# Patient Record
Sex: Male | Born: 1945 | Race: White | Hispanic: No | Marital: Married | Smoking: Never smoker
Health system: Southern US, Community
[De-identification: ages and names within clinical notes are randomized; demographics above are authoritative.]

## PROBLEM LIST (undated history)

## (undated) DIAGNOSIS — M4647 Discitis, unspecified, lumbosacral region: Secondary | ICD-10-CM

## (undated) DIAGNOSIS — N183 Chronic kidney disease, stage 3 unspecified: Secondary | ICD-10-CM

## (undated) DIAGNOSIS — I2699 Other pulmonary embolism without acute cor pulmonale: Secondary | ICD-10-CM

## (undated) DIAGNOSIS — B958 Unspecified staphylococcus as the cause of diseases classified elsewhere: Secondary | ICD-10-CM

## (undated) DIAGNOSIS — M109 Gout, unspecified: Secondary | ICD-10-CM

## (undated) DIAGNOSIS — I10 Essential (primary) hypertension: Secondary | ICD-10-CM

## (undated) DIAGNOSIS — I4891 Unspecified atrial fibrillation: Secondary | ICD-10-CM

## (undated) DIAGNOSIS — E785 Hyperlipidemia, unspecified: Secondary | ICD-10-CM

## (undated) DIAGNOSIS — E119 Type 2 diabetes mellitus without complications: Secondary | ICD-10-CM

## (undated) DIAGNOSIS — K219 Gastro-esophageal reflux disease without esophagitis: Secondary | ICD-10-CM

## (undated) HISTORY — DX: Essential (primary) hypertension: I10

## (undated) HISTORY — PX: SHOULDER ARTHROSCOPY W/ ROTATOR CUFF REPAIR: SHX2400

## (undated) HISTORY — DX: Gastro-esophageal reflux disease without esophagitis: K21.9

## (undated) HISTORY — DX: Type 2 diabetes mellitus without complications: E11.9

## (undated) HISTORY — DX: Hyperlipidemia, unspecified: E78.5

---

## 2004-05-15 ENCOUNTER — Inpatient Hospital Stay: Payer: Self-pay

## 2004-05-16 ENCOUNTER — Other Ambulatory Visit: Payer: Self-pay

## 2005-01-03 ENCOUNTER — Ambulatory Visit: Payer: Self-pay

## 2005-01-09 ENCOUNTER — Ambulatory Visit: Payer: Self-pay

## 2005-10-01 ENCOUNTER — Ambulatory Visit: Payer: Self-pay | Admitting: Family Medicine

## 2005-10-16 ENCOUNTER — Ambulatory Visit: Payer: Self-pay | Admitting: Gastroenterology

## 2006-06-24 ENCOUNTER — Ambulatory Visit: Payer: Self-pay | Admitting: Internal Medicine

## 2006-07-04 ENCOUNTER — Ambulatory Visit: Payer: Self-pay | Admitting: Internal Medicine

## 2007-05-08 ENCOUNTER — Ambulatory Visit: Payer: Self-pay | Admitting: Family Medicine

## 2007-06-16 ENCOUNTER — Ambulatory Visit: Payer: Self-pay | Admitting: Specialist

## 2007-07-27 ENCOUNTER — Ambulatory Visit: Payer: Self-pay | Admitting: Family Medicine

## 2008-06-12 ENCOUNTER — Emergency Department: Payer: Self-pay | Admitting: Emergency Medicine

## 2008-10-19 ENCOUNTER — Ambulatory Visit: Payer: Self-pay | Admitting: Specialist

## 2008-12-07 ENCOUNTER — Emergency Department: Payer: Self-pay | Admitting: Emergency Medicine

## 2008-12-09 ENCOUNTER — Inpatient Hospital Stay: Payer: Self-pay | Admitting: Internal Medicine

## 2009-01-27 ENCOUNTER — Emergency Department: Payer: Self-pay | Admitting: Emergency Medicine

## 2010-05-31 DIAGNOSIS — M109 Gout, unspecified: Secondary | ICD-10-CM | POA: Insufficient documentation

## 2010-05-31 DIAGNOSIS — I129 Hypertensive chronic kidney disease with stage 1 through stage 4 chronic kidney disease, or unspecified chronic kidney disease: Secondary | ICD-10-CM | POA: Insufficient documentation

## 2010-05-31 DIAGNOSIS — E785 Hyperlipidemia, unspecified: Secondary | ICD-10-CM | POA: Insufficient documentation

## 2010-05-31 DIAGNOSIS — E119 Type 2 diabetes mellitus without complications: Secondary | ICD-10-CM | POA: Insufficient documentation

## 2011-12-18 DIAGNOSIS — E875 Hyperkalemia: Secondary | ICD-10-CM | POA: Insufficient documentation

## 2015-01-10 ENCOUNTER — Ambulatory Visit (INDEPENDENT_AMBULATORY_CARE_PROVIDER_SITE_OTHER): Payer: Federal, State, Local not specified - PPO | Admitting: Family Medicine

## 2015-01-10 ENCOUNTER — Encounter: Payer: Self-pay | Admitting: Family Medicine

## 2015-01-10 VITALS — BP 122/76 | HR 65 | Temp 98.9°F | Resp 18 | Ht 73.0 in | Wt 238.3 lb

## 2015-01-10 DIAGNOSIS — E0840 Diabetes mellitus due to underlying condition with diabetic neuropathy, unspecified: Secondary | ICD-10-CM

## 2015-01-10 DIAGNOSIS — J40 Bronchitis, not specified as acute or chronic: Secondary | ICD-10-CM | POA: Diagnosis not present

## 2015-01-10 DIAGNOSIS — J0101 Acute recurrent maxillary sinusitis: Secondary | ICD-10-CM

## 2015-01-10 MED ORDER — PREDNISONE 20 MG PO TABS
20.0000 mg | ORAL_TABLET | Freq: Every day | ORAL | Status: DC
Start: 2015-01-10 — End: 2015-01-25

## 2015-01-10 MED ORDER — FLUTICASONE PROPIONATE 50 MCG/ACT NA SUSP: 2.0000 | Freq: Every day | NASAL | Status: DC

## 2015-01-10 MED ORDER — BENZONATATE 100 MG PO CAPS: 100.0000 mg | ORAL_CAPSULE | Freq: Two times a day (BID) | ORAL | Status: DC | PRN

## 2015-01-10 MED ORDER — AMOXICILLIN-POT CLAVULANATE 875-125 MG PO TABS: 1.0000 | ORAL_TABLET | Freq: Two times a day (BID) | ORAL | Status: DC

## 2015-01-10 NOTE — Progress Notes (Signed)
Name: Eric Glover   MRN: [id number]    DOB: 11-Jul-1945   Date:01/10/2015       Progress Note  Subjective  Chief Complaint  Chief Complaint  Patient presents with  . URI    for 3 weeks    HPI  Sinusitis  Patient presents with greater than 7 day history of nasal congestion and drainage which is purulent in color. There is tenderness over the sinuses. There has been fever to none along with some associated chills on occasion. Usage of over-the-counter medications is not been affected. There is also accompanying cough productive of purulent sputum.  Bronchitis  Patient presents with a greater than 1 week history of cough productive of purulent sputum. The cough is irritating and keep the patient awake at night. There has no fever or chills.  Over-the-counter meds And completely effective.  Diabetes  Patient states sugars have been running well and admitted 100s being followed by the [facility]    Past Medical History  Diagnosis Date  . Diabetes mellitus without complication ([place])   . GERD (gastroesophageal reflux disease)   . Hyperlipidemia   . Hypertension     Social History  Substance Use Topics  . Smoking status: Never Smoker   . Smokeless tobacco: Not on file  . Alcohol Use: No     Current outpatient prescriptions:  .  colchicine (COLCRYS) 0.6 MG tablet, Take 0.6 mg by mouth., Disp: , Rfl:  .  furosemide (LASIX) 20 MG tablet, Take 20 mg by mouth., Disp: , Rfl:  .  insulin aspart protamine- aspart (NOVOLOG MIX 70/30) (70-30) 100 UNIT/ML injection, Frequency:PHARMDIR   Dosage:0.0     Instructions:  Note:65U in the am 35U in the evening Dose: 70-30/ML, Disp: , Rfl:  .  propranolol ER (INDERAL LA) 80 MG 24 hr capsule, Take 80 mg by mouth., Disp: , Rfl:  .  tiZANidine (ZANAFLEX) 4 MG tablet, Take 4 mg by mouth., Disp: , Rfl:  .  allopurinol (ZYLOPRIM) 100 MG tablet, Take 100 mg by mouth., Disp: , Rfl:  .  aspirin EC 81 MG tablet, Take 81 mg by mouth., Disp: , Rfl:  .   doxazosin (CARDURA) 8 MG tablet, Take 8 mg by mouth., Disp: , Rfl:  .  HYDROcodone-acetaminophen (NORCO/VICODIN) 5-325 MG tablet, Take by mouth., Disp: , Rfl:  .  insulin detemir (LEVEMIR) 100 UNIT/ML injection, Inject into the skin., Disp: , Rfl:  .  lansoprazole (PREVACID) 15 MG capsule, Take 15 mg by mouth., Disp: , Rfl:  .  lisinopril (PRINIVIL,ZESTRIL) 40 MG tablet, Take 40 mg by mouth., Disp: , Rfl:  .  simvastatin (ZOCOR) 80 MG tablet, Take 80 mg by mouth., Disp: , Rfl:  .  vitamin B-12 (CYANOCOBALAMIN) 500 MCG tablet, Take by mouth., Disp: , Rfl:   Allergies  Allergen Reactions  . Neomycin-Bacitracin Zn-Polymyx     RASH    Review of Systems  Constitutional: Positive for chills. Negative for fever and weight loss.  HENT: Positive for congestion. Negative for hearing loss, sore throat and tinnitus.        Sinus tenderness  Eyes: Negative for blurred vision, double vision and redness.  Respiratory: Positive for cough, sputum production and wheezing. Negative for hemoptysis and shortness of breath.   Cardiovascular: Negative for chest pain, palpitations, orthopnea, claudication and leg swelling.  Gastrointestinal: Negative for heartburn, nausea, vomiting, diarrhea, constipation and blood in stool.  Genitourinary: Negative for dysuria, urgency, frequency and hematuria.  Musculoskeletal: Negative for myalgias, back pain,  joint pain, falls and neck pain.  Skin: Negative for itching.  Neurological: Negative for dizziness, tingling, tremors, focal weakness, seizures, loss of consciousness, weakness and headaches.  Endo/Heme/Allergies: Does not bruise/bleed easily.  Psychiatric/Behavioral: Negative for depression and substance abuse. The patient is not nervous/anxious and does not have insomnia.      Objective  Filed Vitals:   01/10/15 1349  BP: 122/76  Pulse: 65  Temp: 98.9 F (37.2 C)  TempSrc: Oral  Resp: 18  Height: 6\' 1"  (1.854 m)  Weight: 238 lb 4.8 oz (108.092 kg)   SpO2: 96%     Physical Exam  Constitutional: He is oriented to person, place, and time.  Obese but was frequently coughing  HENT:  Head: Normocephalic.  Right TM is erythematous left. There is bilateral nasal turbinate swelling with clear to purulent discharge. Pharynx minimally injected  Eyes: EOM are normal. Pupils are equal, round, and reactive to light.  Neck: Normal range of motion. Neck supple. No thyromegaly present.  Cardiovascular: Normal rate, regular rhythm and normal heart sounds.   No murmur heard. Pulmonary/Chest: Breath sounds normal. No respiratory distress. He has no wheezes.  Scattered rhonchi are noted  Musculoskeletal: Normal range of motion. He exhibits no edema.  Lymphadenopathy:    He has no cervical adenopathy.  Neurological: He is alert and oriented to person, place, and time. No cranial nerve deficit. Gait normal. Coordination normal.  Skin: Skin is warm and dry. No rash noted.  Psychiatric: Affect and judgment normal.      Assessment & Plan   1. Acute recurrent maxillary sinusitis Treatment as below - amoxicillin-clavulanate (AUGMENTIN) 875-125 MG tablet; Take 1 tablet by mouth 2 (two) times daily.  Dispense: 20 tablet; Refill: 0 - predniSONE (DELTASONE) 20 MG tablet; Take 1 tablet (20 mg total) by mouth daily with breakfast.  Dispense: 10 tablet; Refill: 0 - fluticasone (FLONASE) 50 MCG/ACT nasal spray; Place 2 sprays into both nostrils daily.  Dispense: 16 g; Refill: 6 - benzonatate (TESSALON) 100 MG capsule; Take 1 capsule (100 mg total) by mouth 2 (two) times daily as needed for cough.  Dispense: 20 capsule; Refill: 0  2. Bronchitis Treated as below - amoxicillin-clavulanate (AUGMENTIN) 875-125 MG tablet; Take 1 tablet by mouth 2 (two) times daily.  Dispense: 20 tablet; Refill: 0 - predniSONE (DELTASONE) 20 MG tablet; Take 1 tablet (20 mg total) by mouth daily with breakfast.  Dispense: 10 tablet; Refill: 0 - fluticasone (FLONASE) 50 MCG/ACT  nasal spray; Place 2 sprays into both nostrils daily.  Dispense: 16 g; Refill: 6 - benzonatate (TESSALON) 100 MG capsule; Take 1 capsule (100 mg total) by mouth 2 (two) times daily as needed for cough.  Dispense: 20 capsule; Refill: 0    3. Diabetes mellitus with neuropathy

## 2015-01-11 DIAGNOSIS — E1140 Type 2 diabetes mellitus with diabetic neuropathy, unspecified: Secondary | ICD-10-CM | POA: Insufficient documentation

## 2015-01-25 ENCOUNTER — Encounter: Payer: Self-pay | Admitting: Family Medicine

## 2015-01-25 ENCOUNTER — Ambulatory Visit (INDEPENDENT_AMBULATORY_CARE_PROVIDER_SITE_OTHER): Payer: Federal, State, Local not specified - PPO | Admitting: Family Medicine

## 2015-01-25 VITALS — BP 110/54 | HR 74 | Temp 98.8°F | Resp 16 | Ht 73.0 in | Wt 238.0 lb

## 2015-01-25 DIAGNOSIS — J209 Acute bronchitis, unspecified: Secondary | ICD-10-CM

## 2015-01-25 DIAGNOSIS — J40 Bronchitis, not specified as acute or chronic: Secondary | ICD-10-CM | POA: Diagnosis not present

## 2015-01-25 MED ORDER — TRAMADOL HCL 50 MG PO TABS: 50.0000 mg | ORAL_TABLET | Freq: Four times a day (QID) | ORAL | Status: DC | PRN

## 2015-01-25 MED ORDER — PREDNISONE 20 MG PO TABS: ORAL_TABLET | ORAL | Status: DC

## 2015-01-25 MED ORDER — CEFTRIAXONE SODIUM 1 G IJ SOLR
1.0000 g | Freq: Once | INTRAMUSCULAR | Status: AC
  Administered 2015-01-25: 1 g via INTRAMUSCULAR

## 2015-01-25 MED ORDER — HYDROCOD POLST-CPM POLST ER 10-8 MG/5ML PO SUER: 5.0000 mL | Freq: Two times a day (BID) | ORAL | Status: DC | PRN

## 2015-01-25 NOTE — Patient Instructions (Signed)
Chronic Bronchitis Chronic bronchitis is a lasting inflammation of the bronchial tubes, which are the tubes that carry air into your lungs. This is inflammation that occurs:   On most days of the week.   For at least three months at a time.   Over a period of two years in a row. When the bronchial tubes are inflamed, they start to produce mucus. The inflammation and buildup of mucus make it more difficult to breathe. Chronic bronchitis is usually a permanent problem and is one type of chronic obstructive pulmonary disease (COPD). People with chronic bronchitis are at greater risk for getting repeated colds, or respiratory infections. CAUSES  Chronic bronchitis most often occurs in people who have:  Long-standing, severe asthma.  A history of smoking.  Asthma and who also smoke. SIGNS AND SYMPTOMS  Chronic bronchitis may cause the following:   A cough that brings up mucus (productive cough).  Shortness of breath.  Early morning headache.  Wheezing.  Chest discomfort.   Recurring respiratory infections. DIAGNOSIS  Your health care provider may confirm the diagnosis by:  Taking your medical history.  Performing a physical exam.  Taking a chest X-ray.   Performing pulmonary function tests. TREATMENT  Treatment involves controlling symptoms with medicines, oxygen therapy, or making lifestyle changes, such as exercising and eating a healthy, well-balanced diet. Medicines could include:  Inhalers to improve air flow in and out of your lungs.  Antibiotics to treat bacterial infections, such as pneumonia, sinus infections, and acute bronchitis. As a preventative measure, your health care provider may recommend routine vaccinations for influenza and pneumonia. This is to prevent infection and hospitalization since you may be more at risk for these types of infections.  HOME CARE INSTRUCTIONS  Take medicines only as directed by your health care provider.   If you smoke  cigarettes, chew tobacco, or use electronic cigarettes, quit. If you need help quitting, ask your health care provider.  Avoid pollen, dust, animal dander, molds, smoke, and other things that cause shortness of breath or wheezing attacks.  Talk to your health care provider about possible exercise routines. Regular exercise is very important to help you feel better.  If you are prescribed oxygen use at home follow these guidelines:  Never smoke while using oxygen. Oxygen does not burn or explode, but flammable materials will burn faster in the presence of oxygen.  Keep a fire extinguisher close by. Let your fire department know that you have oxygen in your home.  Warn visitors not to smoke near you when you are using oxygen. Put up "no smoking" signs in your home where you most often use the oxygen.  Regularly test your smoke detectors at home to make sure they work. If you receive care in your home from a nurse or other health care provider, he or she may also check to make sure your smoke detectors work.  Ask your health care provider whether you would benefit from a pulmonary rehabilitation program.  Do not wait to get medical care if you have any concerning symptoms. Delays could cause permanent injury and may be life threatening. SEEK MEDICAL CARE IF:  You have increased coughing or shortness of breath or both.  You have muscle aches.  You have chest pain.  Your mucus gets thicker.  Your mucus changes from clear or white to yellow, green, gray, or bloody. SEEK IMMEDIATE MEDICAL CARE IF:  Your usual medicines do not stop your wheezing.   You have increased difficulty breathing.     You have any problems with the medicine you are taking, such as a rash, itching, swelling, or trouble breathing. MAKE SURE YOU:   Understand these instructions.  Will watch your condition.  Will get help right away if you are not doing well or get worse.   This information is not intended to  replace advice given to you by your health care provider. Make sure you discuss any questions you have with your health care provider.   Document Released: 12/27/2005 Document Revised: 04/01/2014 Document Reviewed: 04/19/2013 Elsevier Interactive Patient Education 2016 [employer]  

## 2015-01-25 NOTE — Progress Notes (Signed)
Name: Eric Glover   MRN: [id number]    DOB: 1946/03/12   Date:01/25/2015       Progress Note  Subjective  Chief Complaint  Chief Complaint  Patient presents with  . Cough    patient states he has chest congestion    HPI  [name] is a 69 year old male who is here today with complaints of a cough. He was seen on 01/10/15 by another provider in the office with complaints of nasal congestion, drainage, sinus pressure, productive cough. He was started on Augmentin for 10 days, prednisone, flonase, tessalon. He notes that his sinus congestion has improved and his cough subsided initially but returned along with anterior chest wall pressure as soon as the antibiotics were finished. In the past about 3 years ago he had a particularly difficult bought of bronchitis and IM rocephin helped.   Past Medical History  Diagnosis Date  . Diabetes mellitus without complication ([place])   . GERD (gastroesophageal reflux disease)   . Hyperlipidemia   . Hypertension     Social History  Substance Use Topics  . Smoking status: Never Smoker   . Smokeless tobacco: Not on file  . Alcohol Use: No     Current outpatient prescriptions:  .  allopurinol (ZYLOPRIM) 100 MG tablet, Take 100 mg by mouth., Disp: , Rfl:  .  aspirin EC 81 MG tablet, Take 81 mg by mouth., Disp: , Rfl:  .  benzonatate (TESSALON) 100 MG capsule, Take 1 capsule (100 mg total) by mouth 2 (two) times daily as needed for cough., Disp: 20 capsule, Rfl: 0 .  colchicine (COLCRYS) 0.6 MG tablet, Take 0.6 mg by mouth., Disp: , Rfl:  .  doxazosin (CARDURA) 8 MG tablet, Take 8 mg by mouth., Disp: , Rfl:  .  fluticasone (FLONASE) 50 MCG/ACT nasal spray, Place 2 sprays into both nostrils daily., Disp: 16 g, Rfl: 6 .  furosemide (LASIX) 20 MG tablet, Take 20 mg by mouth., Disp: , Rfl:  .  HYDROcodone-acetaminophen (NORCO/VICODIN) 5-325 MG tablet, Take by mouth., Disp: , Rfl:  .  insulin aspart protamine- aspart (NOVOLOG MIX 70/30)  (70-30) 100 UNIT/ML injection, Frequency:PHARMDIR   Dosage:0.0     Instructions:  Note:65U in the am 35U in the evening Dose: 70-30/ML, Disp: , Rfl:  .  insulin detemir (LEVEMIR) 100 UNIT/ML injection, Inject into the skin., Disp: , Rfl:  .  lansoprazole (PREVACID) 15 MG capsule, Take 15 mg by mouth., Disp: , Rfl:  .  lisinopril (PRINIVIL,ZESTRIL) 40 MG tablet, Take 40 mg by mouth., Disp: , Rfl:  .  propranolol ER (INDERAL LA) 80 MG 24 hr capsule, Take 80 mg by mouth., Disp: , Rfl:  .  simvastatin (ZOCOR) 80 MG tablet, Take 80 mg by mouth., Disp: , Rfl:  .  tiZANidine (ZANAFLEX) 4 MG tablet, Take 4 mg by mouth., Disp: , Rfl:  .  vitamin B-12 (CYANOCOBALAMIN) 500 MCG tablet, Take by mouth., Disp: , Rfl:   Allergies  Allergen Reactions  . Neomycin-Bacitracin Zn-Polymyx     RASH    ROS  Positive for fatigue, cough as mentioned in HPI, otherwise all systems reviewed and are negative.  Objective  Filed Vitals:   01/25/15 1418  BP: 110/54  Pulse: 74  Temp: 98.8 F (37.1 C)  TempSrc: Oral  Resp: 16  Height: 6\' 1"  (1.854 m)  Weight: 238 lb (107.956 kg)  SpO2: 96%   Body mass index is 31.41 kg/(m^2).   Physical Exam  Constitutional: Patient appears well-developed and well-nourished. In no acute distress but does appear to be fatigued from acute illness. HEENT:  - Head: Normocephalic and atraumatic.  - Ears: RIGHT and LEFT TM pearly grey with no erythema or fluid. - Nose: Nasal mucosa boggy. - Mouth/Throat: Oropharynx is moist with slight erythema of bilateral tonsils without hypertrophy or exudates. No post nasal drainage present.  - Eyes: Conjunctivae clear, EOM movements normal. PERRLA. No scleral icterus.  Neck: Normal range of motion. Neck supple. No JVD present. No thyromegaly present. No local lymphadenopathy. Cardiovascular: Regular rate, regular rhythm with no murmurs heard.  Pulmonary/Chest: Effort normal and moving good air throughout lung fields with audible rhonchi  anterior upper airways. No rales or wheezing appreciated on exam. Musculoskeletal: Normal range of motion bilateral UE and LE, no joint effusions. Skin: Skin is warm and dry. No rash noted. Psychiatric: Patient has a normal mood and affect. Behavior is normal in office today. Judgment and thought content normal in office today.   Assessment & Plan  1. Bronchitis with bronchospasm Ongoing symptoms despite taking Augmentin. I will get CXR and he is given Rocephin 1 gm IM today along with another course of prednisone. Instructed for him to use Tussionex at night and to try tramadol plus Delsym cough syrup OTC during day time.   - cefTRIAXone (ROCEPHIN) injection 1 g; Inject 1 g into the muscle once. - predniSONE (DELTASONE) 20 MG tablet; Take 1 tablet po bid for 3 days then 1 tablet po qday for 4 more days  Dispense: 10 tablet; Refill: 0 - traMADol (ULTRAM) 50 MG tablet; Take 1 tablet (50 mg total) by mouth every 6 (six) hours as needed for moderate pain or severe pain.  Dispense: 40 tablet; Refill: 0 - chlorpheniramine-HYDROcodone (TUSSIONEX PENNKINETIC ER) 10-8 MG/5ML SUER; Take 5 mLs by mouth every 12 (twelve) hours as needed for cough.  Dispense: 100 mL; Refill: 0 - DG Chest 2 View; Future

## 2015-02-03 ENCOUNTER — Telehealth: Payer: Self-pay | Admitting: Family Medicine

## 2015-02-03 NOTE — Telephone Encounter (Signed)
Pt would like refill on cough meds that was prescribed by Dr Nadine Counts, Hydrocodone Chlorphen ER suppressant to be sent to [facility].

## 2015-02-06 NOTE — Telephone Encounter (Signed)
Ok to call

## 2015-02-07 ENCOUNTER — Ambulatory Visit
Admission: RE | Admit: 2015-02-07 | Discharge: 2015-02-07 | Disposition: A | Discharge status: Home / Self Care | Payer: Federal, State, Local not specified - PPO | Source: Ambulatory Visit | Attending: Family Medicine | Admitting: Family Medicine

## 2015-02-07 DIAGNOSIS — J40 Bronchitis, not specified as acute or chronic: Secondary | ICD-10-CM | POA: Diagnosis not present

## 2015-02-07 DIAGNOSIS — J209 Acute bronchitis, unspecified: Secondary | ICD-10-CM

## 2015-02-10 ENCOUNTER — Ambulatory Visit (INDEPENDENT_AMBULATORY_CARE_PROVIDER_SITE_OTHER): Payer: Federal, State, Local not specified - PPO | Admitting: Family Medicine

## 2015-02-10 ENCOUNTER — Encounter: Payer: Self-pay | Admitting: Family Medicine

## 2015-02-10 VITALS — BP 122/70 | HR 65 | Temp 98.9°F | Resp 16 | Ht 73.0 in | Wt 238.0 lb

## 2015-02-10 DIAGNOSIS — J0100 Acute maxillary sinusitis, unspecified: Secondary | ICD-10-CM

## 2015-02-10 DIAGNOSIS — J0101 Acute recurrent maxillary sinusitis: Secondary | ICD-10-CM

## 2015-02-10 DIAGNOSIS — J40 Bronchitis, not specified as acute or chronic: Secondary | ICD-10-CM | POA: Diagnosis not present

## 2015-02-10 DIAGNOSIS — J329 Chronic sinusitis, unspecified: Secondary | ICD-10-CM | POA: Insufficient documentation

## 2015-02-10 DIAGNOSIS — J209 Acute bronchitis, unspecified: Secondary | ICD-10-CM

## 2015-02-10 MED ORDER — BENZONATATE 100 MG PO CAPS: 100.0000 mg | ORAL_CAPSULE | Freq: Two times a day (BID) | ORAL | Status: DC | PRN

## 2015-02-10 MED ORDER — LEVOFLOXACIN 750 MG PO TABS: 750.0000 mg | ORAL_TABLET | Freq: Every day | ORAL | Status: DC

## 2015-02-10 MED ORDER — IPRATROPIUM BROMIDE 0.03 % NA SOLN: 2.0000 | Freq: Two times a day (BID) | NASAL | Status: DC

## 2015-02-10 MED ORDER — BECLOMETHASONE DIPROPIONATE 80 MCG/ACT IN AERS
2.0000 | INHALATION_SPRAY | Freq: Two times a day (BID) | RESPIRATORY_TRACT | Status: DC
Start: 2015-02-10 — End: 2015-06-13

## 2015-02-10 NOTE — Progress Notes (Signed)
Name: Eric Glover   MRN: [id number]    DOB: [birth year]   Date:02/10/2015       Progress Note  Subjective  Chief Complaint  Chief Complaint  Patient presents with  . Cough    For 4 weeks    HPI  Bronchitis  Patient presents with a greater than 14week history of cough productive of purulent sputum. The cough is irritating and keep the patient awake at night. There has no fever or chills.  Over-the-counter meds And completely effective. He has been through a course of Augmentin with Tussionex and prednisone and Delsym with tramadol. Despite these measures he continues to have a cough.  Sinusitis  Patient presents with greater than 7 day history of nasal congestion and drainage which is purulent in color. There is tenderness over the sinuses. There has been fever to unknown along with some associated chills on occasion. Usage of over-the-counter medications is not been affected. He is already been through a course of antibiotics a few weeks ago. There is also accompanying cough productive of purulent sputum.  Diabetes  Patient has a history of over 5 years of insulin-dependent diabetes. He has been ill as noted above recently states his blood sugars have not appreciably increase. There is no polyuria polyuria polydipsia polyphagia.  Past Medical History  Diagnosis Date  . Diabetes mellitus without complication ([place])   . GERD (gastroesophageal reflux disease)   . Hyperlipidemia   . Hypertension     Social History  Substance Use Topics  . Smoking status: Never Smoker   . Smokeless tobacco: Not on file  . Alcohol Use: No     Current outpatient prescriptions:  .  allopurinol (ZYLOPRIM) 100 MG tablet, Take 100 mg by mouth., Disp: , Rfl:  .  aspirin EC 81 MG tablet, Take 81 mg by mouth., Disp: , Rfl:  .  colchicine (COLCRYS) 0.6 MG tablet, Take 0.6 mg by mouth., Disp: , Rfl:  .  doxazosin (CARDURA) 8 MG tablet, Take 8 mg by mouth., Disp: , Rfl:  .  fluticasone (FLONASE) 50  MCG/ACT nasal spray, Place 2 sprays into both nostrils daily., Disp: 16 g, Rfl: 6 .  furosemide (LASIX) 20 MG tablet, Take 20 mg by mouth., Disp: , Rfl:  .  HYDROcodone-acetaminophen (NORCO/VICODIN) 5-325 MG tablet, Take by mouth., Disp: , Rfl:  .  insulin aspart protamine- aspart (NOVOLOG MIX 70/30) (70-30) 100 UNIT/ML injection, Frequency:PHARMDIR   Dosage:0.0     Instructions:  Note:65U in the am 35U in the evening Dose: 70-30/ML, Disp: , Rfl:  .  insulin detemir (LEVEMIR) 100 UNIT/ML injection, Inject into the skin., Disp: , Rfl:  .  lansoprazole (PREVACID) 15 MG capsule, Take 15 mg by mouth., Disp: , Rfl:  .  lisinopril (PRINIVIL,ZESTRIL) 40 MG tablet, Take 40 mg by mouth., Disp: , Rfl:  .  propranolol ER (INDERAL LA) 80 MG 24 hr capsule, Take 80 mg by mouth., Disp: , Rfl:  .  simvastatin (ZOCOR) 80 MG tablet, Take 80 mg by mouth., Disp: , Rfl:  .  tiZANidine (ZANAFLEX) 4 MG tablet, Take 4 mg by mouth., Disp: , Rfl:  .  traMADol (ULTRAM) 50 MG tablet, Take 1 tablet (50 mg total) by mouth every 6 (six) hours as needed for moderate pain or severe pain., Disp: 40 tablet, Rfl: 0 .  vitamin B-12 (CYANOCOBALAMIN) 500 MCG tablet, Take by mouth., Disp: , Rfl:  .  benzonatate (TESSALON) 100 MG capsule, Take 1 capsule (100 mg total) by mouth  2 (two) times daily as needed for cough. (Patient not taking: Reported on 02/10/2015), Disp: 20 capsule, Rfl: 0 .  chlorpheniramine-HYDROcodone (TUSSIONEX PENNKINETIC ER) 10-8 MG/5ML SUER, Take 5 mLs by mouth every 12 (twelve) hours as needed for cough., Disp: 100 mL, Rfl: 0  Allergies  Allergen Reactions  . Neomycin-Bacitracin Zn-Polymyx     RASH    Review of Systems  Constitutional: Positive for fever. Negative for chills and weight loss.  HENT: Positive for congestion. Negative for hearing loss, sore throat and tinnitus.   Eyes: Negative for blurred vision, double vision and redness.  Respiratory: Positive for cough (secondary to cough), sputum production  and wheezing. Negative for hemoptysis and shortness of breath.   Cardiovascular: Positive for chest pain. Negative for palpitations, orthopnea, claudication and leg swelling.  Gastrointestinal: Negative for heartburn, nausea, vomiting, diarrhea, constipation and blood in stool.  Genitourinary: Negative for dysuria, urgency, frequency and hematuria.  Musculoskeletal: Positive for myalgias. Negative for back pain, joint pain, falls and neck pain.  Skin: Negative for itching.  Neurological: Negative for dizziness, tingling, tremors, focal weakness, seizures, loss of consciousness, weakness and headaches.  Endo/Heme/Allergies: Does not bruise/bleed easily.  Psychiatric/Behavioral: Negative for depression and substance abuse. The patient is not nervous/anxious and does not have insomnia.      Objective  Filed Vitals:   02/10/15 1125  BP: 122/70  Pulse: 65  Temp: 98.9 F (37.2 C)  TempSrc: Oral  Resp: 16  Height: 6\' 1"  (1.854 m)  Weight: 238 lb (107.956 kg)  SpO2: 94%     Physical Exam  Constitutional: He is oriented to person, place, and time and well-developed, well-nourished, and in no distress.  Obese male with chronic cough  HENT:  Head: Normocephalic.  Eyes: EOM are normal. Pupils are equal, round, and reactive to light.  Neck: Normal range of motion. Neck supple. No thyromegaly present.  Cardiovascular: Normal rate, regular rhythm and normal heart sounds.   No murmur heard. Pulmonary/Chest: Effort normal and breath sounds normal. No respiratory distress. He has no wheezes.  Abdominal: Soft. Bowel sounds are normal.  Musculoskeletal: Normal range of motion. He exhibits no edema.  Lymphadenopathy:    He has no cervical adenopathy.  Neurological: He is alert and oriented to person, place, and time. No cranial nerve deficit. Gait normal. Coordination normal.  Skin: Skin is warm and dry. No rash noted.  Psychiatric: Affect and judgment normal.      Assessment &  Plan   1. Bronchitis with bronchospasm Worsening - levofloxacin (LEVAQUIN) 750 MG tablet; Take 1 tablet (750 mg total) by mouth daily.  Dispense: 10 tablet; Refill: 0 - beclomethasone (QVAR) 80 MCG/ACT inhaler; Inhale 2 puffs into the lungs 2 (two) times daily.  Dispense: 1 Inhaler; Refill: 12  2. Acute maxillary sinusitis, recurrence not specified Not resolved - ipratropium (ATROVENT) 0.03 % nasal spray; Place 2 sprays into both nostrils every 12 (twelve) hours.  Dispense: 30 mL; Refill: 12 - levofloxacin (LEVAQUIN) 750 MG tablet; Take 1 tablet (750 mg total) by mouth daily.  Dispense: 10 tablet; Refill: 0  3. Insulin-dependent diabetes mellitus Stable

## 2015-03-13 ENCOUNTER — Encounter: Payer: Self-pay | Admitting: Family Medicine

## 2015-03-13 ENCOUNTER — Ambulatory Visit (INDEPENDENT_AMBULATORY_CARE_PROVIDER_SITE_OTHER): Payer: Federal, State, Local not specified - PPO | Admitting: Family Medicine

## 2015-03-13 VITALS — BP 138/74 | HR 63 | Temp 98.6°F | Resp 18 | Ht 73.0 in | Wt 240.2 lb

## 2015-03-13 DIAGNOSIS — M1000 Idiopathic gout, unspecified site: Secondary | ICD-10-CM | POA: Diagnosis not present

## 2015-03-13 DIAGNOSIS — J0100 Acute maxillary sinusitis, unspecified: Secondary | ICD-10-CM

## 2015-03-13 DIAGNOSIS — E0840 Diabetes mellitus due to underlying condition with diabetic neuropathy, unspecified: Secondary | ICD-10-CM | POA: Diagnosis not present

## 2015-03-13 DIAGNOSIS — N183 Chronic kidney disease, stage 3 unspecified: Secondary | ICD-10-CM

## 2015-03-13 DIAGNOSIS — R05 Cough: Secondary | ICD-10-CM

## 2015-03-13 DIAGNOSIS — J40 Bronchitis, not specified as acute or chronic: Secondary | ICD-10-CM

## 2015-03-13 DIAGNOSIS — R053 Chronic cough: Secondary | ICD-10-CM

## 2015-03-13 MED ORDER — PREDNISONE 20 MG PO TABS: 20.0000 mg | ORAL_TABLET | Freq: Two times a day (BID) | ORAL | Status: DC

## 2015-03-13 MED ORDER — TRAMADOL HCL 50 MG PO TABS: 50.0000 mg | ORAL_TABLET | Freq: Three times a day (TID) | ORAL | Status: DC | PRN

## 2015-03-13 MED ORDER — PREDNISONE 20 MG PO TABS: 20.0000 mg | ORAL_TABLET | Freq: Every day | ORAL | Status: DC

## 2015-03-13 NOTE — Progress Notes (Signed)
Name: Eric Glover   MRN: [id number]    DOB: 16-Dec-1945   Date:03/13/2015       Progress Note  Subjective  Chief Complaint  Chief Complaint  Patient presents with  . Bronchitis    HPI   Bronchitis  Patient presents with a greater than 1 week history of cough productive of purulent sputum. The cough is irritating and keep the patient awake at night. There has no fever or chills.  Over-the-counter meds And completely effective. He has artery been through a course of Levaquin and is on Qvar. He was seen in urgent care in [facility] and was given a shot of what sounds to be Rocephin. He continues to cough nonproductively and is having some rib discomfort from the chronic cough.  Sinusitis  Patient presents with greater than 7 day history of nasal congestion and drainage which is purulent in color. There is tenderness over the sinuses. There has been fever to - along with some associated chills on occasion. Usage of over-the-counter medications is not been affected. There is also accompanying cough productive of purulent sputum.   Past Medical History  Diagnosis Date  . Diabetes mellitus without complication ([place])   . GERD (gastroesophageal reflux disease)   . Hyperlipidemia   . Hypertension     Social History  Substance Use Topics  . Smoking status: Never Smoker   . Smokeless tobacco: Not on file  . Alcohol Use: No     Current outpatient prescriptions:  .  allopurinol (ZYLOPRIM) 100 MG tablet, Take 100 mg by mouth., Disp: , Rfl:  .  aspirin EC 81 MG tablet, Take 81 mg by mouth., Disp: , Rfl:  .  beclomethasone (QVAR) 80 MCG/ACT inhaler, Inhale 2 puffs into the lungs 2 (two) times daily., Disp: 1 Inhaler, Rfl: 12 .  benzonatate (TESSALON) 100 MG capsule, Take 1 capsule (100 mg total) by mouth 2 (two) times daily as needed for cough., Disp: 30 capsule, Rfl: 0 .  chlorpheniramine-HYDROcodone (TUSSIONEX PENNKINETIC ER) 10-8 MG/5ML SUER, Take 5 mLs by mouth every 12 (twelve)  hours as needed for cough., Disp: 100 mL, Rfl: 0 .  colchicine (COLCRYS) 0.6 MG tablet, Take 0.6 mg by mouth., Disp: , Rfl:  .  doxazosin (CARDURA) 8 MG tablet, Take 8 mg by mouth., Disp: , Rfl:  .  fluticasone (FLONASE) 50 MCG/ACT nasal spray, Place 2 sprays into both nostrils daily., Disp: 16 g, Rfl: 6 .  furosemide (LASIX) 20 MG tablet, Take 20 mg by mouth., Disp: , Rfl:  .  HYDROcodone-acetaminophen (NORCO/VICODIN) 5-325 MG tablet, Take by mouth., Disp: , Rfl:  .  insulin aspart protamine- aspart (NOVOLOG MIX 70/30) (70-30) 100 UNIT/ML injection, Frequency:PHARMDIR   Dosage:0.0     Instructions:  Note:65U in the am 35U in the evening Dose: 70-30/ML, Disp: , Rfl:  .  insulin detemir (LEVEMIR) 100 UNIT/ML injection, Inject into the skin., Disp: , Rfl:  .  ipratropium (ATROVENT) 0.03 % nasal spray, Place 2 sprays into both nostrils every 12 (twelve) hours., Disp: 30 mL, Rfl: 12 .  lansoprazole (PREVACID) 15 MG capsule, Take 15 mg by mouth., Disp: , Rfl:  .  levofloxacin (LEVAQUIN) 750 MG tablet, Take 1 tablet (750 mg total) by mouth daily., Disp: 10 tablet, Rfl: 0 .  lisinopril (PRINIVIL,ZESTRIL) 40 MG tablet, Take 40 mg by mouth., Disp: , Rfl:  .  propranolol ER (INDERAL LA) 80 MG 24 hr capsule, Take 80 mg by mouth., Disp: , Rfl:  .  simvastatin (ZOCOR)  80 MG tablet, Take 80 mg by mouth., Disp: , Rfl:  .  tiZANidine (ZANAFLEX) 4 MG tablet, Take 4 mg by mouth., Disp: , Rfl:  .  traMADol (ULTRAM) 50 MG tablet, Take 1 tablet (50 mg total) by mouth every 6 (six) hours as needed for moderate pain or severe pain., Disp: 40 tablet, Rfl: 0 .  vitamin B-12 (CYANOCOBALAMIN) 500 MCG tablet, Take by mouth., Disp: , Rfl:   Allergies  Allergen Reactions  . Neomycin-Bacitracin Zn-Polymyx     RASH    Review of Systems  Constitutional: Positive for fever, chills and malaise/fatigue. Negative for weight loss.  HENT: Positive for congestion. Negative for hearing loss, sore throat and tinnitus.   Eyes:  Negative for blurred vision, double vision and redness.  Respiratory: Positive for cough and wheezing. Negative for hemoptysis and shortness of breath.   Cardiovascular: Negative for chest pain, palpitations, orthopnea, claudication and leg swelling.       Chest wall pain from chronic cough  Gastrointestinal: Negative for heartburn, nausea, vomiting, diarrhea, constipation and blood in stool.  Genitourinary: Negative for dysuria, urgency, frequency and hematuria.  Musculoskeletal: Negative for myalgias, back pain, joint pain, falls and neck pain.  Skin: Negative for itching.  Neurological: Negative for dizziness, tingling, tremors, focal weakness, seizures, loss of consciousness, weakness and headaches.  Endo/Heme/Allergies: Does not bruise/bleed easily.  Psychiatric/Behavioral: Negative for depression and substance abuse. The patient is not nervous/anxious and does not have insomnia.      Objective  Filed Vitals:   03/13/15 1420  BP: 138/74  Pulse: 63  Temp: 98.6 F (37 C)  Resp: 18  Height: 6\' 1"  (1.854 m)  Weight: 240 lb 3 oz (108.948 kg)  SpO2: 96%     Physical Exam  Constitutional: He is oriented to person, place, and time.  Obese male in mild distress or chronic cough  HENT:  Head: Normocephalic.  Bilateral maxillary frontal sinus tenderness with swollen turbinates with purulent discharge  Eyes: EOM are normal. Pupils are equal, round, and reactive to light.  Neck: Normal range of motion. Neck supple. No thyromegaly present.  Cardiovascular: Normal rate, regular rhythm and normal heart sounds.   No murmur heard. Pulmonary/Chest: Effort normal and breath sounds normal. No respiratory distress. He has no wheezes.  Abdominal: Soft. Bowel sounds are normal.  Musculoskeletal: Normal range of motion. He exhibits no edema.  Lymphadenopathy:    He has no cervical adenopathy.  Neurological: He is alert and oriented to person, place, and time. No cranial nerve deficit. Gait  normal. Coordination normal.  Skin: Skin is warm and dry. No rash noted.  Psychiatric: Affect and judgment normal.      Assessment & Plan  1. Acute maxillary sinusitis, recurrence not specified Persistent - predniSONE (DELTASONE) 20 MG tablet; Take 1 tablet (20 mg total) by mouth daily with breakfast.  Dispense: 10 tablet; Refill: 0  2. Bronchitis Persistent - predniSONE (DELTASONE) 20 MG tablet; Take 1 tablet (20 mg total) by mouth daily with breakfast.  Dispense: 10 tablet; Refill: 0  3. Persistent cough Moderately severe - traMADol (ULTRAM) 50 MG tablet; Take 1 tablet (50 mg total) by mouth every 8 (eight) hours as needed.  Dispense: 40 tablet; Refill: 0 - predniSONE (DELTASONE) 20 MG tablet; Take 1 tablet (20 mg total) by mouth daily with breakfast.  Dispense: 10 tablet; Refill: 0 - traMADol (ULTRAM) 50 MG tablet; Take 1 tablet (50 mg total) by mouth every 8 (eight) hours as needed.  Dispense: 30  tablet; Refill: 0  4. Diabetes mellitus due to underlying condition with diabetic neuropathy, without long-term current use of insulin (HCC) Followed by the [facility] and good control  5. Chronic kidney disease (CKD), stage III (moderate) Follow-up the [place] and nephrologist  6. Idiopathic gout, unspecified chronicity, unspecified site Stable

## 2015-03-26 DIAGNOSIS — B958 Unspecified staphylococcus as the cause of diseases classified elsewhere: Secondary | ICD-10-CM

## 2015-03-26 HISTORY — DX: Unspecified staphylococcus as the cause of diseases classified elsewhere: B95.8

## 2015-05-06 ENCOUNTER — Emergency Department: Payer: Non-veteran care

## 2015-05-06 ENCOUNTER — Inpatient Hospital Stay
Admission: EM | Admit: 2015-05-06 | Discharge: 2015-05-17 | DRG: 551 | Disposition: A | Discharge status: Home Health | Payer: Non-veteran care | Attending: Internal Medicine | Admitting: Internal Medicine

## 2015-05-06 DIAGNOSIS — N400 Enlarged prostate without lower urinary tract symptoms: Secondary | ICD-10-CM | POA: Diagnosis present

## 2015-05-06 DIAGNOSIS — I10 Essential (primary) hypertension: Secondary | ICD-10-CM | POA: Diagnosis present

## 2015-05-06 DIAGNOSIS — G253 Myoclonus: Secondary | ICD-10-CM | POA: Diagnosis present

## 2015-05-06 DIAGNOSIS — R0602 Shortness of breath: Secondary | ICD-10-CM

## 2015-05-06 DIAGNOSIS — M51369 Other intervertebral disc degeneration, lumbar region without mention of lumbar back pain or lower extremity pain: Secondary | ICD-10-CM | POA: Diagnosis present

## 2015-05-06 DIAGNOSIS — J9600 Acute respiratory failure, unspecified whether with hypoxia or hypercapnia: Secondary | ICD-10-CM | POA: Diagnosis not present

## 2015-05-06 DIAGNOSIS — Z794 Long term (current) use of insulin: Secondary | ICD-10-CM

## 2015-05-06 DIAGNOSIS — M5136 Other intervertebral disc degeneration, lumbar region: Secondary | ICD-10-CM | POA: Diagnosis present

## 2015-05-06 DIAGNOSIS — M4807 Spinal stenosis, lumbosacral region: Secondary | ICD-10-CM | POA: Diagnosis present

## 2015-05-06 DIAGNOSIS — M549 Dorsalgia, unspecified: Secondary | ICD-10-CM

## 2015-05-06 DIAGNOSIS — I2699 Other pulmonary embolism without acute cor pulmonale: Secondary | ICD-10-CM | POA: Diagnosis not present

## 2015-05-06 DIAGNOSIS — K219 Gastro-esophageal reflux disease without esophagitis: Secondary | ICD-10-CM | POA: Diagnosis present

## 2015-05-06 DIAGNOSIS — M545 Low back pain, unspecified: Secondary | ICD-10-CM | POA: Diagnosis present

## 2015-05-06 DIAGNOSIS — Z452 Encounter for adjustment and management of vascular access device: Secondary | ICD-10-CM

## 2015-05-06 DIAGNOSIS — Z79899 Other long term (current) drug therapy: Secondary | ICD-10-CM

## 2015-05-06 DIAGNOSIS — I129 Hypertensive chronic kidney disease with stage 1 through stage 4 chronic kidney disease, or unspecified chronic kidney disease: Secondary | ICD-10-CM | POA: Diagnosis present

## 2015-05-06 DIAGNOSIS — E1140 Type 2 diabetes mellitus with diabetic neuropathy, unspecified: Secondary | ICD-10-CM | POA: Diagnosis present

## 2015-05-06 DIAGNOSIS — R05 Cough: Secondary | ICD-10-CM

## 2015-05-06 DIAGNOSIS — R059 Cough, unspecified: Secondary | ICD-10-CM

## 2015-05-06 DIAGNOSIS — Z8261 Family history of arthritis: Secondary | ICD-10-CM

## 2015-05-06 DIAGNOSIS — E1122 Type 2 diabetes mellitus with diabetic chronic kidney disease: Secondary | ICD-10-CM | POA: Diagnosis present

## 2015-05-06 DIAGNOSIS — G9340 Encephalopathy, unspecified: Secondary | ICD-10-CM

## 2015-05-06 DIAGNOSIS — Z841 Family history of disorders of kidney and ureter: Secondary | ICD-10-CM

## 2015-05-06 DIAGNOSIS — M5386 Other specified dorsopathies, lumbar region: Secondary | ICD-10-CM | POA: Diagnosis present

## 2015-05-06 DIAGNOSIS — E860 Dehydration: Secondary | ICD-10-CM | POA: Diagnosis present

## 2015-05-06 DIAGNOSIS — M109 Gout, unspecified: Secondary | ICD-10-CM | POA: Diagnosis present

## 2015-05-06 DIAGNOSIS — E119 Type 2 diabetes mellitus without complications: Secondary | ICD-10-CM

## 2015-05-06 DIAGNOSIS — G9341 Metabolic encephalopathy: Secondary | ICD-10-CM | POA: Diagnosis not present

## 2015-05-06 DIAGNOSIS — Z7982 Long term (current) use of aspirin: Secondary | ICD-10-CM

## 2015-05-06 DIAGNOSIS — E785 Hyperlipidemia, unspecified: Secondary | ICD-10-CM | POA: Diagnosis present

## 2015-05-06 DIAGNOSIS — M5116 Intervertebral disc disorders with radiculopathy, lumbar region: Principal | ICD-10-CM | POA: Diagnosis present

## 2015-05-06 DIAGNOSIS — Z8249 Family history of ischemic heart disease and other diseases of the circulatory system: Secondary | ICD-10-CM

## 2015-05-06 DIAGNOSIS — R509 Fever, unspecified: Secondary | ICD-10-CM | POA: Diagnosis present

## 2015-05-06 DIAGNOSIS — N183 Chronic kidney disease, stage 3 (moderate): Secondary | ICD-10-CM | POA: Diagnosis present

## 2015-05-06 DIAGNOSIS — I480 Paroxysmal atrial fibrillation: Secondary | ICD-10-CM | POA: Diagnosis present

## 2015-05-06 DIAGNOSIS — Z833 Family history of diabetes mellitus: Secondary | ICD-10-CM

## 2015-05-06 DIAGNOSIS — A408 Other streptococcal sepsis: Secondary | ICD-10-CM | POA: Diagnosis not present

## 2015-05-06 DIAGNOSIS — N179 Acute kidney failure, unspecified: Secondary | ICD-10-CM | POA: Diagnosis present

## 2015-05-06 DIAGNOSIS — B370 Candidal stomatitis: Secondary | ICD-10-CM | POA: Diagnosis not present

## 2015-05-06 DIAGNOSIS — E875 Hyperkalemia: Secondary | ICD-10-CM | POA: Diagnosis present

## 2015-05-06 DIAGNOSIS — N189 Chronic kidney disease, unspecified: Secondary | ICD-10-CM | POA: Diagnosis present

## 2015-05-06 HISTORY — DX: Chronic kidney disease, stage 3 (moderate): N18.3

## 2015-05-06 HISTORY — DX: Chronic kidney disease, stage 3 unspecified: N18.30

## 2015-05-06 HISTORY — DX: Gout, unspecified: M10.9

## 2015-05-06 LAB — URINALYSIS COMPLETE WITH MICROSCOPIC (ARMC ONLY)
BACTERIA UA: NONE SEEN
Bilirubin Urine: NEGATIVE
Hgb urine dipstick: NEGATIVE
Leukocytes, UA: NEGATIVE
NITRITE: NEGATIVE
Protein, ur: NEGATIVE mg/dL
RBC / HPF: NONE SEEN RBC/hpf (ref 0–5)
SPECIFIC GRAVITY, URINE: 1.015 (ref 1.005–1.030)
pH: 5 (ref 5.0–8.0)

## 2015-05-06 LAB — COMPREHENSIVE METABOLIC PANEL
ALBUMIN: 3.8 g/dL (ref 3.5–5.0)
ALK PHOS: 67 U/L (ref 38–126)
ALT: 22 U/L (ref 17–63)
AST: 28 U/L (ref 15–41)
Anion gap: 8 (ref 5–15)
BUN: 57 mg/dL — AB (ref 6–20)
CALCIUM: 8.7 mg/dL — AB (ref 8.9–10.3)
CO2: 18 mmol/L — AB (ref 22–32)
CREATININE: 2.16 mg/dL — AB (ref 0.61–1.24)
Chloride: 105 mmol/L (ref 101–111)
GFR calc Af Amer: 34 mL/min — ABNORMAL LOW (ref >60)
GFR calc non Af Amer: 29 mL/min — ABNORMAL LOW (ref >60)
GLUCOSE: 331 mg/dL — AB (ref 65–99)
Potassium: 5.5 mmol/L — ABNORMAL HIGH (ref 3.5–5.1)
SODIUM: 131 mmol/L — AB (ref 135–145)
Total Bilirubin: 1.1 mg/dL (ref 0.3–1.2)
Total Protein: 7.1 g/dL (ref 6.5–8.1)

## 2015-05-06 LAB — GLUCOSE, CAPILLARY
GLUCOSE-CAPILLARY: 357 mg/dL — AB (ref 65–99)
GLUCOSE-CAPILLARY: 243 mg/dL — AB (ref 65–99)

## 2015-05-06 LAB — CBC WITH DIFFERENTIAL/PLATELET
BASOS PCT: 0 %
Basophils Absolute: 0 10*3/uL (ref 0–0.1)
EOS ABS: 0.1 10*3/uL (ref 0–0.7)
Eosinophils Relative: 1 %
HCT: 35.9 % — ABNORMAL LOW (ref 40.0–52.0)
HEMOGLOBIN: 11.9 g/dL — AB (ref 13.0–18.0)
Lymphocytes Relative: 7 %
Lymphs Abs: 0.8 10*3/uL — ABNORMAL LOW (ref 1.0–3.6)
MCH: 27.4 pg (ref 26.0–34.0)
MCHC: 33.3 g/dL (ref 32.0–36.0)
MCV: 82.5 fL (ref 80.0–100.0)
Monocytes Absolute: 0.9 10*3/uL (ref 0.2–1.0)
Monocytes Relative: 8 %
NEUTROS ABS: 8.9 10*3/uL — AB (ref 1.4–6.5)
NEUTROS PCT: 84 %
Platelets: 296 10*3/uL (ref 150–440)
RBC: 4.35 MIL/uL — AB (ref 4.40–5.90)
RDW: 14.9 % — ABNORMAL HIGH (ref 11.5–14.5)
WBC: 10.7 10*3/uL — AB (ref 3.8–10.6)

## 2015-05-06 MED ORDER — PREDNISONE 20 MG PO TABS
40.0000 mg | ORAL_TABLET | Freq: Every day | ORAL | Status: DC
  Administered 2015-05-07 – 2015-05-10 (×4): 40 mg via ORAL
  Filled 2015-05-06 (×4): qty 2

## 2015-05-06 MED ORDER — HYDROMORPHONE HCL 1 MG/ML IJ SOLN
0.5000 mg | INTRAMUSCULAR | Status: DC | PRN
  Administered 2015-05-07 – 2015-05-13 (×8): 0.5 mg via INTRAVENOUS
  Filled 2015-05-06 (×8): qty 1

## 2015-05-06 MED ORDER — BECLOMETHASONE DIPROPIONATE 80 MCG/ACT IN AERS: 2.0000 | INHALATION_SPRAY | Freq: Two times a day (BID) | RESPIRATORY_TRACT | Status: DC

## 2015-05-06 MED ORDER — CYCLOBENZAPRINE HCL 10 MG PO TABS
10.0000 mg | ORAL_TABLET | Freq: Three times a day (TID) | ORAL | Status: DC | PRN
  Administered 2015-05-07 – 2015-05-13 (×8): 10 mg via ORAL
  Filled 2015-05-06 (×8): qty 1

## 2015-05-06 MED ORDER — ONDANSETRON HCL 4 MG/2ML IJ SOLN: 4.0000 mg | Freq: Four times a day (QID) | INTRAMUSCULAR | Status: DC | PRN

## 2015-05-06 MED ORDER — ATORVASTATIN CALCIUM 20 MG PO TABS
40.0000 mg | ORAL_TABLET | Freq: Every day | ORAL | Status: DC
  Administered 2015-05-07 – 2015-05-16 (×10): 40 mg via ORAL
  Filled 2015-05-06 (×10): qty 2

## 2015-05-06 MED ORDER — INSULIN ASPART 100 UNIT/ML ~~LOC~~ SOLN
0.0000 [IU] | Freq: Three times a day (TID) | SUBCUTANEOUS | Status: DC
  Administered 2015-05-07: 7 [IU] via SUBCUTANEOUS
  Administered 2015-05-07: 9 [IU] via SUBCUTANEOUS
  Administered 2015-05-07: 5 [IU] via SUBCUTANEOUS
  Administered 2015-05-07: 9 [IU] via SUBCUTANEOUS
  Administered 2015-05-08: 3 [IU] via SUBCUTANEOUS
  Administered 2015-05-08: 7 [IU] via SUBCUTANEOUS
  Filled 2015-05-06: qty 7
  Filled 2015-05-06: qty 10
  Filled 2015-05-06: qty 3
  Filled 2015-05-06: qty 7
  Filled 2015-05-06: qty 9
  Filled 2015-05-06: qty 5

## 2015-05-06 MED ORDER — PANTOPRAZOLE SODIUM 40 MG PO TBEC
40.0000 mg | DELAYED_RELEASE_TABLET | Freq: Every day | ORAL | Status: DC
  Administered 2015-05-07 – 2015-05-17 (×11): 40 mg via ORAL
  Filled 2015-05-06 (×11): qty 1

## 2015-05-06 MED ORDER — KETOROLAC TROMETHAMINE 30 MG/ML IJ SOLN
15.0000 mg | Freq: Once | INTRAMUSCULAR | Status: AC
  Administered 2015-05-06: 15 mg via INTRAVENOUS
  Filled 2015-05-06: qty 1

## 2015-05-06 MED ORDER — MORPHINE SULFATE (PF) 4 MG/ML IV SOLN
INTRAVENOUS | Status: AC
  Administered 2015-05-06: 4 mg via INTRAVENOUS
  Filled 2015-05-06: qty 1

## 2015-05-06 MED ORDER — ASPIRIN EC 81 MG PO TBEC
81.0000 mg | DELAYED_RELEASE_TABLET | Freq: Every day | ORAL | Status: DC
  Administered 2015-05-07 – 2015-05-17 (×11): 81 mg via ORAL
  Filled 2015-05-06 (×11): qty 1

## 2015-05-06 MED ORDER — HEPARIN SODIUM (PORCINE) 5000 UNIT/ML IJ SOLN
5000.0000 [IU] | Freq: Three times a day (TID) | INTRAMUSCULAR | Status: DC
  Administered 2015-05-07 – 2015-05-08 (×4): 5000 [IU] via SUBCUTANEOUS
  Filled 2015-05-06 (×4): qty 1

## 2015-05-06 MED ORDER — MORPHINE SULFATE (PF) 4 MG/ML IV SOLN
4.0000 mg | INTRAVENOUS | Status: AC
  Administered 2015-05-06: 4 mg via INTRAVENOUS

## 2015-05-06 MED ORDER — ONDANSETRON HCL 4 MG PO TABS: 4.0000 mg | ORAL_TABLET | Freq: Four times a day (QID) | ORAL | Status: DC | PRN

## 2015-05-06 MED ORDER — INSULIN DETEMIR 100 UNIT/ML ~~LOC~~ SOLN
30.0000 [IU] | Freq: Every day | SUBCUTANEOUS | Status: DC
  Filled 2015-05-06 (×2): qty 0.3

## 2015-05-06 MED ORDER — SODIUM CHLORIDE 0.9 % IV BOLUS (SEPSIS)
1000.0000 mL | Freq: Once | INTRAVENOUS | Status: AC
  Administered 2015-05-06: 1000 mL via INTRAVENOUS

## 2015-05-06 MED ORDER — DOXAZOSIN MESYLATE 4 MG PO TABS
8.0000 mg | ORAL_TABLET | Freq: Every day | ORAL | Status: DC
  Administered 2015-05-07 – 2015-05-17 (×11): 8 mg via ORAL
  Filled 2015-05-06 (×11): qty 2

## 2015-05-06 MED ORDER — ACETAMINOPHEN 325 MG PO TABS
650.0000 mg | ORAL_TABLET | Freq: Four times a day (QID) | ORAL | Status: DC | PRN
  Administered 2015-05-07 – 2015-05-09 (×3): 650 mg via ORAL
  Filled 2015-05-06 (×3): qty 2

## 2015-05-06 MED ORDER — SODIUM CHLORIDE 0.9 % IV SOLN
INTRAVENOUS | Status: AC
  Administered 2015-05-07: via INTRAVENOUS

## 2015-05-06 MED ORDER — ACETAMINOPHEN 650 MG RE SUPP: 650.0000 mg | Freq: Four times a day (QID) | RECTAL | Status: DC | PRN

## 2015-05-06 MED ORDER — LISINOPRIL 20 MG PO TABS: 40.0000 mg | ORAL_TABLET | Freq: Every day | ORAL | Status: DC

## 2015-05-06 MED ORDER — HYDROMORPHONE HCL 1 MG/ML IJ SOLN
1.0000 mg | Freq: Once | INTRAMUSCULAR | Status: AC
  Administered 2015-05-06: 1 mg via INTRAVENOUS
  Filled 2015-05-06: qty 1

## 2015-05-06 MED ORDER — OXYCODONE HCL 5 MG PO TABS
5.0000 mg | ORAL_TABLET | ORAL | Status: DC | PRN
  Administered 2015-05-07 – 2015-05-13 (×11): 5 mg via ORAL
  Filled 2015-05-06 (×11): qty 1

## 2015-05-06 NOTE — H&P (Signed)
[facility] NAME: Eric Glover    MR#:  [id number]  DATE OF BIRTH:  1945-07-06  DATE OF ADMISSION:  05/06/2015  PRIMARY CARE PHYSICIAN: Ashok Norris, MD   REQUESTING/REFERRING PHYSICIAN: Reita Cliche, MD  CHIEF COMPLAINT:   Chief Complaint  Patient presents with  . Back Pain    HISTORY OF PRESENT ILLNESS:  Eric Glover  is a 70 y.o. male who presents with patient is here with acutely worse low back pain. This is a chronic problem for him, but he states that about 3 days ago it started getting worse. He progressed to the point that he feels like he is unable to stand on it today. It is low right sided back pain, which sometimes radiates down his leg, and is associated with some mild tingling in his anterior thigh. He states that he was told at some point in the past after some low back imaging that he had a bulging disc. He has been managing this back pain for some time with pain medications and muscle relaxers. However, he states that for this acute episode nothing has seemed to help it get better. Hospitalists were called for admission.  PAST MEDICAL HISTORY:   Past Medical History  Diagnosis Date  . Diabetes mellitus without complication ([place])   . GERD (gastroesophageal reflux disease)   . Hyperlipidemia   . Hypertension   . CKD (chronic kidney disease), stage III   . Gout     PAST SURGICAL HISTORY:   Past Surgical History  Procedure Laterality Date  . Shoulder arthroscopy w/ rotator cuff repair      SOCIAL HISTORY:   Social History  Substance Use Topics  . Smoking status: Never Smoker   . Smokeless tobacco: Not on file  . Alcohol Use: No    FAMILY HISTORY:  History reviewed. No pertinent family history.  DRUG ALLERGIES:   Allergies  Allergen Reactions  . Neomycin-Bacitracin Zn-Polymyx     RASH    MEDICATIONS AT HOME:   Prior to Admission medications   Medication Sig Start Date End Date  Taking? Authorizing Provider  allopurinol (ZYLOPRIM) 100 MG tablet Take 100 mg by mouth.    Historical Provider, MD  aspirin EC 81 MG tablet Take 81 mg by mouth.    Historical Provider, MD  beclomethasone (QVAR) 80 MCG/ACT inhaler Inhale 2 puffs into the lungs 2 (two) times daily. 02/10/15   Ashok Norris, MD  colchicine (COLCRYS) 0.6 MG tablet Take 0.6 mg by mouth. 11/12/14   Historical Provider, MD  doxazosin (CARDURA) 8 MG tablet Take 8 mg by mouth.    Historical Provider, MD  fluticasone (FLONASE) 50 MCG/ACT nasal spray Place 2 sprays into both nostrils daily. 01/10/15   Ashok Norris, MD  furosemide (LASIX) 20 MG tablet Take 20 mg by mouth. 11/09/14   Historical Provider, MD  HYDROcodone-acetaminophen (NORCO/VICODIN) 5-325 MG tablet Take by mouth.    Historical Provider, MD  insulin aspart protamine- aspart (NOVOLOG MIX 70/30) (70-30) 100 UNIT/ML injection Frequency:PHARMDIR   Dosage:0.0     Instructions:  Note:65U in the am 35U in the evening Dose: 70-30/ML 05/28/12   Historical Provider, MD  insulin detemir (LEVEMIR) 100 UNIT/ML injection Inject into the skin.    Historical Provider, MD  ipratropium (ATROVENT) 0.03 % nasal spray Place 2 sprays into both nostrils every 12 (twelve) hours. 02/10/15   Ashok Norris, MD  lansoprazole (PREVACID) 15 MG capsule Take 15 mg by mouth.  Historical Provider, MD  lisinopril (PRINIVIL,ZESTRIL) 40 MG tablet Take 40 mg by mouth.    Historical Provider, MD  propranolol ER (INDERAL LA) 80 MG 24 hr capsule Take 80 mg by mouth. 11/08/14   Historical Provider, MD  simvastatin (ZOCOR) 80 MG tablet Take 80 mg by mouth.    Historical Provider, MD  tiZANidine (ZANAFLEX) 4 MG tablet Take 4 mg by mouth. 11/07/14   Historical Provider, MD  traMADol (ULTRAM) 50 MG tablet Take 1 tablet (50 mg total) by mouth every 8 (eight) hours as needed. 03/13/15   Ashok Norris, MD  vitamin B-12 (CYANOCOBALAMIN) 500 MCG tablet Take by mouth.    Historical Provider, MD    REVIEW  OF SYSTEMS:  Review of Systems  Constitutional: Negative for fever, chills, weight loss and malaise/fatigue.  HENT: Negative for ear pain, hearing loss and tinnitus.   Eyes: Negative for blurred vision, double vision, pain and redness.  Respiratory: Negative for cough, hemoptysis and shortness of breath.   Cardiovascular: Negative for chest pain, palpitations, orthopnea and leg swelling.  Gastrointestinal: Negative for nausea, vomiting, abdominal pain, diarrhea and constipation.  Genitourinary: Negative for dysuria, frequency and hematuria.  Musculoskeletal: Positive for back pain. Negative for joint pain and neck pain.  Skin:       No acne, rash, or lesions  Neurological: Positive for sensory change. Negative for dizziness, tremors, focal weakness and weakness.  Endo/Heme/Allergies: Negative for polydipsia. Does not bruise/bleed easily.  Psychiatric/Behavioral: Negative for depression. The patient is not nervous/anxious and does not have insomnia.      VITAL SIGNS:   Filed Vitals:   05/06/15 1900 05/06/15 1930 05/06/15 2000 05/06/15 2200  BP: 118/71 120/62 120/68 140/66  Pulse: 65 68 65 83  Temp:      TempSrc:      Resp:   18   Height:      Weight:      SpO2: 94% 95% 98% 97%   Wt Readings from Last 3 Encounters:  05/06/15 104.327 kg (230 lb)  03/13/15 108.948 kg (240 lb 3 oz)  02/10/15 107.956 kg (238 lb)    PHYSICAL EXAMINATION:  Physical Exam  Vitals reviewed. Constitutional: He is oriented to person, place, and time. He appears well-developed and well-nourished. He appears distressed (appears uncomfortable).  HENT:  Head: Normocephalic and atraumatic.  Mouth/Throat: Oropharynx is clear and moist.  Eyes: Conjunctivae and EOM are normal. Pupils are equal, round, and reactive to light. No scleral icterus.  Neck: Normal range of motion. Neck supple. No JVD present. No thyromegaly present.  Cardiovascular: Normal rate, regular rhythm and intact distal pulses.  Exam reveals  no gallop and no friction rub.   No murmur heard. Respiratory: Effort normal and breath sounds normal. No respiratory distress. He has no wheezes. He has no rales.  GI: Soft. Bowel sounds are normal. He exhibits no distension. There is no tenderness.  Musculoskeletal: Normal range of motion. He exhibits tenderness (Right lateral low back pain). He exhibits no edema.  No arthritis, no gout  Lymphadenopathy:    He has no cervical adenopathy.  Neurological: He is alert and oriented to person, place, and time. No cranial nerve deficit.  No dysarthria, no aphasia  Skin: Skin is warm and dry. No rash noted. No erythema.  Psychiatric: He has a normal mood and affect. His behavior is normal. Judgment and thought content normal.    LABORATORY PANEL:   CBC  Recent Labs Lab 05/06/15 1540  WBC 10.7*  HGB 11.9*  HCT 35.9*  PLT 296   ------------------------------------------------------------------------------------------------------------------  Chemistries   Recent Labs Lab 05/06/15 1540  NA 131*  K 5.5*  CL 105  CO2 18*  GLUCOSE 331*  BUN 57*  CREATININE 2.16*  CALCIUM 8.7*  AST 28  ALT 22  ALKPHOS 67  BILITOT 1.1   ------------------------------------------------------------------------------------------------------------------  Cardiac Enzymes No results for input(s): TROPONINI in the last 168 hours. ------------------------------------------------------------------------------------------------------------------  RADIOLOGY:  Dg Lumbar Spine Complete  05/06/2015  CLINICAL DATA:  70 year old presenting with low back pain, muscle spasms and radicular pain into the right lower extremity which began yesterday. EXAM: LUMBAR SPINE - COMPLETE 4+ VIEW COMPARISON:  None. FINDINGS: Five non rib-bearing lumbar vertebrae with anatomic posterior alignment. No fractures. Mild thoracolumbar scoliosis convex right. Disc space narrowing and endplate hypertrophic changes at every lumbar  level, worst at L1-2 and L5-S1. Bridging osteophytes at multiple lower thoracic and lumbar levels. No pars defects. Facet degenerative changes at L4-5 and L5-S1, left greater than right. Sacroiliac joints intact. Aortoiliac atherosclerosis without aneurysm. IMPRESSION: 1. No acute osseous abnormality. 2. Multilevel degenerative disc disease, spondylosis and facet degenerative changes as detailed above. Electronically Signed   By: Evangeline Dakin M.D.   On: 05/06/2015 21:09    EKG:   Orders placed or performed in visit on 12/09/08  . EKG 12-Lead    IMPRESSION AND PLAN:  Principal Problem:   Acute on chronic kidney failure ([place]) - unclear etiology.  We'll give some IV fluids tonight and reevaluate with a.m. labs. Active Problems:   Low back pain - acute exacerbation of a chronic problem for this patient. Some history reported of a bulging disc. He has been on pain medication and muscle relaxer for a long time to manage this. We'll add short-term prednisone today.   Type 2 diabetes mellitus (HCC) - continue long-acting insulin and sliding scale with corresponding glucose checks before meals and at bedtime. Carb modified diet.   HTN (hypertension) - continue home meds   HLD (hyperlipidemia) - tinea home meds  All the records are reviewed and case discussed with ED provider. Management plans discussed with the patient and/or family.  DVT PROPHYLAXIS: SubQ heparin  GI PROPHYLAXIS: PPI  ADMISSION STATUS: Observation  CODE STATUS: Full Code Status History    This patient does not have a recorded code status. Please follow your organizational policy for patients in this situation.      TOTAL TIME TAKING CARE OF THIS PATIENT: 45 minutes.    Harden Bramer FIELDING 05/06/2015, 10:36 PM  [facility]  Office  [phone]  CC: Primary care physician; Ashok Norris, MD

## 2015-05-06 NOTE — ED Notes (Signed)
Pt arrived via EMS c/o back pain. Per EMS pt has hx of sciatic nerve pain that was exacerbated yesterday at a wedding rehearsal, and today called EMS because his pain was unrelieved. Per EMS pt reported taking 1 oxycodone, and on scene was lethargic with BP 80/60, and they administered 2 mg of Narcan as well as 300 ml NS. EMS VS 119/50, HR 67, 98%, CBG 375 and on scene pt administered 10 units.

## 2015-05-06 NOTE — ED Provider Notes (Signed)
[facility] Emergency Department Provider Note   ____________________________________________  Time seen: Approximately 3pm I have reviewed the triage vital signs and the triage nursing note.  HISTORY  Chief Complaint Back Pain   Historian Patient  HPI Eric Glover is a 70 y.o. male here from home by EMS for low back pain.  Reports history of chronic low back pain since "I was 70 years old."  Reports granddaughter got married yesterday and he's been more active, but without a particular trauma/injury.  Last few days worsening pain and spasm.  No weakness, numbness, abd pain.  Taking flexeril and hydrocodone at home for pain.  EMS reportscalled for back pain, during ride he had low bp and was sleepy and they did give narcan with improvement in mental status.    Past Medical History  Diagnosis Date  . Diabetes mellitus without complication ([place])   . GERD (gastroesophageal reflux disease)   . Hyperlipidemia   . Hypertension   . CKD (chronic kidney disease), stage III   . Gout     Patient Active Problem List   Diagnosis Date Noted  . HTN (hypertension) 05/06/2015  . Acute on chronic kidney failure ([place]) 05/06/2015  . Low back pain 05/06/2015  . Sinusitis 02/10/2015  . Bronchitis with bronchospasm 01/25/2015  . Diabetes mellitus with neuropathy ([place]) 01/11/2015  . High potassium 12/18/2011  . Benign hypertensive kidney disease 05/31/2010  . Chronic kidney disease (CKD), stage III (moderate) 05/31/2010  . Gout 05/31/2010  . HLD (hyperlipidemia) 05/31/2010  . Type 2 diabetes mellitus ([place]) 05/31/2010    Past Surgical History  Procedure Laterality Date  . Shoulder arthroscopy w/ rotator cuff repair      Current Outpatient Rx  Name  Route  Sig  Dispense  Refill  . allopurinol (ZYLOPRIM) 100 MG tablet   Oral   Take 100 mg by mouth.         Marland Kitchen aspirin EC 81 MG tablet   Oral   Take 81 mg by mouth.         . beclomethasone (QVAR) 80  MCG/ACT inhaler   Inhalation   Inhale 2 puffs into the lungs 2 (two) times daily.   1 Inhaler   12   . benzonatate (TESSALON) 100 MG capsule   Oral   Take 1 capsule (100 mg total) by mouth 2 (two) times daily as needed for cough.   30 capsule   0   . chlorpheniramine-HYDROcodone (TUSSIONEX PENNKINETIC ER) 10-8 MG/5ML SUER   Oral   Take 5 mLs by mouth every 12 (twelve) hours as needed for cough.   100 mL   0   . colchicine (COLCRYS) 0.6 MG tablet   Oral   Take 0.6 mg by mouth.         . doxazosin (CARDURA) 8 MG tablet   Oral   Take 8 mg by mouth.         . fluticasone (FLONASE) 50 MCG/ACT nasal spray   Each Nare   Place 2 sprays into both nostrils daily.   16 g   6   . furosemide (LASIX) 20 MG tablet   Oral   Take 20 mg by mouth.         Marland Kitchen HYDROcodone-acetaminophen (NORCO/VICODIN) 5-325 MG tablet   Oral   Take by mouth.         . insulin aspart protamine- aspart (NOVOLOG MIX 70/30) (70-30) 100 UNIT/ML injection      Frequency:PHARMDIR   Dosage:0.0  Instructions:  Note:65U in the am 35U in the evening Dose: 70-30/ML         . insulin detemir (LEVEMIR) 100 UNIT/ML injection   Subcutaneous   Inject into the skin.         Marland Kitchen ipratropium (ATROVENT) 0.03 % nasal spray   Each Nare   Place 2 sprays into both nostrils every 12 (twelve) hours.   30 mL   12   . lansoprazole (PREVACID) 15 MG capsule   Oral   Take 15 mg by mouth.         . levofloxacin (LEVAQUIN) 750 MG tablet   Oral   Take 1 tablet (750 mg total) by mouth daily.   10 tablet   0   . lisinopril (PRINIVIL,ZESTRIL) 40 MG tablet   Oral   Take 40 mg by mouth.         . predniSONE (DELTASONE) 20 MG tablet   Oral   Take 1 tablet (20 mg total) by mouth daily with breakfast.   10 tablet   0   . propranolol ER (INDERAL LA) 80 MG 24 hr capsule   Oral   Take 80 mg by mouth.         . simvastatin (ZOCOR) 80 MG tablet   Oral   Take 80 mg by mouth.         Marland Kitchen tiZANidine  (ZANAFLEX) 4 MG tablet   Oral   Take 4 mg by mouth.         . traMADol (ULTRAM) 50 MG tablet   Oral   Take 1 tablet (50 mg total) by mouth every 8 (eight) hours as needed.   40 tablet   0   . traMADol (ULTRAM) 50 MG tablet   Oral   Take 1 tablet (50 mg total) by mouth every 8 (eight) hours as needed.   30 tablet   0   . vitamin B-12 (CYANOCOBALAMIN) 500 MCG tablet   Oral   Take by mouth.           Allergies Neomycin-bacitracin zn-polymyx  History reviewed. No pertinent family history.  Social History Social History  Substance Use Topics  . Smoking status: Never Smoker   . Smokeless tobacco: None  . Alcohol Use: No    Review of Systems  Constitutional: Negative for fever. Eyes: Negative for visual changes. ENT: Negative for sore throat. Cardiovascular: Negative for chest pain. Respiratory: Negative for shortness of breath. Gastrointestinal: Negative for abdominal pain, vomiting and diarrhea. Genitourinary: Negative for dysuria. Musculoskeletal: Negative for extremity pain. Skin: Negative for rash. Neurological: Negative for headache. 10 point Review of Systems otherwise negative ____________________________________________   PHYSICAL EXAM:  VITAL SIGNS: ED Triage Vitals  Enc Vitals Group     BP --      Pulse --      Resp --      Temp --      Temp src --      SpO2 --      Weight --      Height --      Head Cir --      Peak Flow --      Pain Score --      Pain Loc --      Pain Edu? --      Excl. in [place]? --      Constitutional: Alert and oriented. Somewhat tremulous and wincing in pain HEENT   Head: Normocephalic and atraumatic.  Eyes: Conjunctivae are normal. PERRL. Normal extraocular movements.      Ears:         Nose: No congestion/rhinnorhea.   Mouth/Throat: Mucous membranes are moist.   Neck: No stridor. Cardiovascular/Chest: Normal rate, regular rhythm.  No murmurs, rubs, or gallops. Respiratory: Normal respiratory  effort without tachypnea nor retractions. Breath sounds are clear and equal bilaterally. No wheezes/rales/rhonchi. Gastrointestinal: Soft. No distention, no guarding, no rebound. Nontender.   Genitourinary/rectal:Deferred Musculoskeletal: Low back tenderness mid and lower back paraspoinous bilaterally.   Nontender with normal range of motion in all extremities. No joint effusions.  No lower extremity tenderness.  No edema. Neurologic:  Normal speech and language. No gross or focal neurologic deficits are appreciated. Skin:  Skin is warm, dry and intact. No rash noted. Psychiatric: Mood and affect are normal. Speech and behavior are normal. Patient exhibits appropriate insight and judgment.  ____________________________________________   EKG I, Lisa Roca, MD, the attending physician have personally viewed and interpreted all ECGs.  None ____________________________________________  LABS (pertinent positives/negatives)  White blood count 10.7, hemoglobin 1.9 and platelet count 296 Comprehensive metabolic panel significant for sodium 131, potassium 5.5, BUN 57 and creatinine 2.16 Urinalysis  ____________________________________________  RADIOLOGY All Xrays were viewed by me. Imaging interpreted by Radiologist.  Lumbar spine complete:  IMPRESSION: 1. No acute osseous abnormality. 2. Multilevel degenerative disc disease, spondylosis and facet degenerative changes as detailed above. __________________________________________  PROCEDURES  Procedure(s) performed: None  Critical Care performed: None  ____________________________________________   ED COURSE / ASSESSMENT AND PLAN  Pertinent labs & imaging results that were available during my care of the patient were reviewed by me and considered in my medical decision making (see chart for details).   Patient is here with acute on chronic back pain, without neurologic symptoms. I don't think he needs an emergency MRI. It  sounds like it's a lot of muscle spasm that is causing the acute intermittent severe pains bilateral low back. He denies any acute trauma, but still requesting an x-ray. His x-ray does not show an acute osseous abnormality.  Patient did receive Toradol, morphine, and Dilaudid IV and has some improvement, but still having frequent intermittent severe low back pain and spasms. He is unable to really stand up. He is going to need hospital admission/observation for ongoing/intractable pain.   His laboratory studies do show mild acute renal failure, and IV fluids were started.    CONSULTATIONS:   Dr. Margaretmary Eddy, hospitalist for admission   Patient / Family / Caregiver informed of clinical course, medical decision-making process, and agree with plan.    ___________________________________________   FINAL CLINICAL IMPRESSION(S) / ED DIAGNOSES   Final diagnoses:  Acute renal failure, unspecified acute renal failure type ([place])  Acute back pain              Note: This dictation was prepared with Dragon dictation. Any transcriptional errors that result from this process are unintentional    Lisa Roca, MD 05/06/15 2142

## 2015-05-07 ENCOUNTER — Observation Stay: Payer: Non-veteran care

## 2015-05-07 LAB — GLUCOSE, CAPILLARY
GLUCOSE-CAPILLARY: 286 mg/dL — AB (ref 65–99)
GLUCOSE-CAPILLARY: 342 mg/dL — AB (ref 65–99)
GLUCOSE-CAPILLARY: 403 mg/dL — AB (ref 65–99)
GLUCOSE-CAPILLARY: 406 mg/dL — AB (ref 65–99)
Glucose-Capillary: 264 mg/dL — ABNORMAL HIGH (ref 65–99)
Glucose-Capillary: 401 mg/dL — ABNORMAL HIGH (ref 65–99)

## 2015-05-07 LAB — BASIC METABOLIC PANEL
Anion gap: 11 (ref 5–15)
BUN: 44 mg/dL — AB (ref 6–20)
CALCIUM: 8.4 mg/dL — AB (ref 8.9–10.3)
CHLORIDE: 109 mmol/L (ref 101–111)
CO2: 15 mmol/L — AB (ref 22–32)
CREATININE: 1.75 mg/dL — AB (ref 0.61–1.24)
GFR calc Af Amer: 44 mL/min — ABNORMAL LOW (ref >60)
GFR calc non Af Amer: 38 mL/min — ABNORMAL LOW (ref >60)
GLUCOSE: 307 mg/dL — AB (ref 65–99)
Potassium: 5.3 mmol/L — ABNORMAL HIGH (ref 3.5–5.1)
Sodium: 135 mmol/L (ref 135–145)

## 2015-05-07 LAB — CBC
HCT: 33.1 % — ABNORMAL LOW (ref 40.0–52.0)
Hemoglobin: 10.8 g/dL — ABNORMAL LOW (ref 13.0–18.0)
MCH: 26.8 pg (ref 26.0–34.0)
MCHC: 32.7 g/dL (ref 32.0–36.0)
MCV: 81.8 fL (ref 80.0–100.0)
Platelets: 282 10*3/uL (ref 150–440)
RBC: 4.04 MIL/uL — ABNORMAL LOW (ref 4.40–5.90)
RDW: 15.3 % — ABNORMAL HIGH (ref 11.5–14.5)
WBC: 11.1 10*3/uL — ABNORMAL HIGH (ref 3.8–10.6)

## 2015-05-07 MED ORDER — BUDESONIDE 0.25 MG/2ML IN SUSP
0.2500 mg | Freq: Two times a day (BID) | RESPIRATORY_TRACT | Status: DC
  Administered 2015-05-07 – 2015-05-16 (×20): 0.25 mg via RESPIRATORY_TRACT
  Filled 2015-05-07 (×21): qty 2

## 2015-05-07 MED ORDER — INSULIN DETEMIR 100 UNIT/ML ~~LOC~~ SOLN: 40.0000 [IU] | Freq: Every day | SUBCUTANEOUS | Status: DC

## 2015-05-07 MED ORDER — GABAPENTIN 300 MG PO CAPS
300.0000 mg | ORAL_CAPSULE | Freq: Three times a day (TID) | ORAL | Status: DC
  Administered 2015-05-07 – 2015-05-09 (×6): 300 mg via ORAL
  Filled 2015-05-07 (×6): qty 1

## 2015-05-07 MED ORDER — SODIUM CHLORIDE 0.9 % IV SOLN
INTRAVENOUS | Status: DC
  Administered 2015-05-07: 19:00:00 via INTRAVENOUS

## 2015-05-07 MED ORDER — ONDANSETRON HCL 4 MG/2ML IJ SOLN
INTRAMUSCULAR | Status: AC
  Administered 2015-05-07: 4 mg
  Filled 2015-05-07: qty 2

## 2015-05-07 MED ORDER — INSULIN DETEMIR 100 UNIT/ML ~~LOC~~ SOLN
30.0000 [IU] | Freq: Every day | SUBCUTANEOUS | Status: DC
  Administered 2015-05-07: 30 [IU] via SUBCUTANEOUS
  Filled 2015-05-07: qty 0.3

## 2015-05-07 NOTE — Progress Notes (Signed)
FSBS critical high of 403 and 406 on RN re check. MD called and notified. MD order to given appropriate sliding scale dose and monitor closely

## 2015-05-07 NOTE — Progress Notes (Signed)
[facility] [place] NAME: Jermere Meschke    MR#:  [id number]  DATE OF BIRTH:  30-Jun-1945  SUBJECTIVE:   Patient here due to intractable back pain also noted to be in acute on chronic renal failure. Creatinine is improved with IV fluids. Still continues to have some significant back pain.  REVIEW OF SYSTEMS:    Review of Systems  Constitutional: Negative for fever and chills.  HENT: Negative for congestion and tinnitus.   Eyes: Negative for blurred vision and double vision.  Respiratory: Negative for cough, shortness of breath and wheezing.   Cardiovascular: Negative for chest pain, orthopnea and PND.  Gastrointestinal: Negative for nausea, vomiting, abdominal pain and diarrhea.  Genitourinary: Negative for dysuria and hematuria.  Musculoskeletal: Positive for back pain.  Neurological: Positive for weakness (Generalized. ). Negative for dizziness, sensory change and focal weakness.  All other systems reviewed and are negative.   Nutrition: Heart healthy/Carb control Tolerating Diet: yes Tolerating PT:  Await Eval.    DRUG ALLERGIES:   Allergies  Allergen Reactions  . Neomycin-Bacitracin Zn-Polymyx Rash    VITALS:  Blood pressure 122/58, pulse 95, temperature 98.4 F (36.9 C), temperature source Oral, resp. rate 20, height 6\' 2"  (1.88 m), weight 102.785 kg (226 lb 9.6 oz), SpO2 93 %.  PHYSICAL EXAMINATION:   Physical Exam  GENERAL:  70 y.o.-year-old patient lying in the bed with no acute distress.  EYES: Pupils equal, round, reactive to light and accommodation. No scleral icterus. Extraocular muscles intact.  HEENT: Head atraumatic, normocephalic. Oropharynx and nasopharynx clear. Dry Oral Mucosa.   NECK:  Supple, no jugular venous distention. No thyroid enlargement, no tenderness.  LUNGS: Normal breath sounds bilaterally, no wheezing, rales, rhonchi. No use of accessory muscles of respiration.  CARDIOVASCULAR: S1,  S2 normal. No murmurs, rubs, or gallops.  ABDOMEN: Soft, nontender, nondistended. Bowel sounds present. No organomegaly or mass.  EXTREMITIES: No cyanosis, clubbing or edema b/l.    NEUROLOGIC: Cranial nerves II through XII are intact. No focal Motor or sensory deficits b/l.   PSYCHIATRIC: The patient is alert and oriented x 3.  SKIN: No obvious rash, lesion, or ulcer.    LABORATORY PANEL:   CBC  Recent Labs Lab 05/07/15 0456  WBC 11.1*  HGB 10.8*  HCT 33.1*  PLT 282   ------------------------------------------------------------------------------------------------------------------  Chemistries   Recent Labs Lab 05/06/15 1540 05/07/15 0456  NA 131* 135  K 5.5* 5.3*  CL 105 109  CO2 18* 15*  GLUCOSE 331* 307*  BUN 57* 44*  CREATININE 2.16* 1.75*  CALCIUM 8.7* 8.4*  AST 28  --   ALT 22  --   ALKPHOS 67  --   BILITOT 1.1  --    ------------------------------------------------------------------------------------------------------------------  Cardiac Enzymes No results for input(s): TROPONINI in the last 168 hours. ------------------------------------------------------------------------------------------------------------------  RADIOLOGY:  Dg Lumbar Spine Complete  05/06/2015  CLINICAL DATA:  70 year old presenting with low back pain, muscle spasms and radicular pain into the right lower extremity which began yesterday. EXAM: LUMBAR SPINE - COMPLETE 4+ VIEW COMPARISON:  None. FINDINGS: Five non rib-bearing lumbar vertebrae with anatomic posterior alignment. No fractures. Mild thoracolumbar scoliosis convex right. Disc space narrowing and endplate hypertrophic changes at every lumbar level, worst at L1-2 and L5-S1. Bridging osteophytes at multiple lower thoracic and lumbar levels. No pars defects. Facet degenerative changes at L4-5 and L5-S1, left greater than right. Sacroiliac joints intact. Aortoiliac atherosclerosis without aneurysm. IMPRESSION: 1. No acute osseous  abnormality. 2. Multilevel degenerative disc disease, spondylosis and facet degenerative changes as detailed above. Electronically Signed   By: Evangeline Dakin M.D.   On: 05/06/2015 21:09   Ct Lumbar Spine Wo Contrast  05/07/2015  CLINICAL DATA:  Acute worsening low back pain. Chronic back pain, worsening over the last 3 days. Difficulty standing today. Low right-sided back pain which sometimes radiates down leg. EXAM: CT LUMBAR SPINE WITHOUT CONTRAST TECHNIQUE: Multidetector CT imaging of the lumbar spine was performed without intravenous contrast administration. Multiplanar CT image reconstructions were also generated. COMPARISON:  Plain film of the lumbar spine dated 05/06/2015. FINDINGS: Dextroscoliosis of the lumbar spine, centered at the at L2-3 level. No acute- appearing osseous abnormality. Degenerative changes are seen throughout the lumbar spine. Disc bulges are seen at every level, most severe at the L5-S1 level causing moderate central canal stenosis and probable right-sided nerve root impingement. Additional disc bulges at the L1-2 thru L4-5 levels causing mild to moderate central canal stenoses and mild to moderate neural foramen narrowings without obvious nerve root impingement. Atherosclerotic changes noted along the walls of the normal- caliber abdominal aorta. Visualized paravertebral soft tissues are otherwise unremarkable. IMPRESSION: 1. Multilevel degenerative disc disease, most severe at the L5-S1 level where a disc bulge is causing moderate central canal stenosis and moderate-to-severe right neural foramen narrowing with probable associated nerve root impingement. This is the likely cause of patient's symptoms. 2. Dextroscoliosis of the lumbar spine. 3. No acute findings. Electronically Signed   By: Franki Cabot M.D.   On: 05/07/2015 11:24     ASSESSMENT AND PLAN:   70 year old male with past medical history of diabetes type 2 without complication, GERD, hypertension,  hyperlipidemia, chronic kidney disease stage III, gout who presents to the hospital with back pain and noted to be in acute on chronic renal failure.  #1 intractable back pain-patient's x-ray on admission showed some mild DJD. He continued to have back pain and I obtained a CT scan of the lumbar spine which showed significant disc bulge at L5-S1 level along with central canal stenosis and some neural foramen narrowing and nerve impingement. -Continue supportive care with prednisone, muscle relaxants, Neurontin. -I will consult anesthesia tomorrow possibly to get a epidural steroid injection if possible. -Obtain physical therapy consult.  # 2 acute on chronic renal failure-Likely due to dehydration.  - cont. IV fluids and Cr. Is improving and back to baseline and will monitor.   #3 DM Type II w/out complication - cont. Levemir, SSI - follow BS  #4 BPH - cont. Doxazosin.    #5 Hyperlipidemia - cont. Hyperlipidemia.    All the records are reviewed and case discussed with Care Management/Social Workerr. Management plans discussed with the patient, family and they are in agreement.  CODE STATUS: Full  DVT Prophylaxis: Heparin SQ  TOTAL TIME TAKING CARE OF THIS PATIENT: 30 minutes.   POSSIBLE D/C IN 1-2 DAYS, DEPENDING ON CLINICAL CONDITION.   Henreitta Leber M.D on 05/07/2015 at 1:01 PM  Between 7am to 6pm - Pager - [phone]  After 6pm go to [email or web address] - password [email or web address] [facility]  Office  [phone]  CC: Primary care physician; Ashok Norris, MD

## 2015-05-07 NOTE — Evaluation (Addendum)
Physical Therapy Evaluation Patient Details Name: Eric Glover MRN: [id number] DOB: 13-Nov-1945 Today's Date: 05/07/2015   History of Present Illness  presented to ER and admitted under observation for acute exacerbation of LBP (with radicular symptoms); also noted in acute/chronic renal failure.  Clinical Impression  Upon evaluation, patient alert and oriented to basic information; follows commands and demonstrates good effort with all activities.  Patient with significant pain in R low back/LE (at rest and with movement) that presents significant barrier to all mobility attempts at this time.  Frequent spasming noted throughout session; patient very guarded, tolerating very limited activity. R LE noted with moderate weakness at hip and knee (limited by pain) with positive paresthesia and radicular symptoms in L1-L2 myotomes/dermatomes.  Denies bowel/bladder difficulty at this time. Currently requiring mod assist +1-2 for all bed mobility, sit/stand, and very minimal gait efforts (2 steps forward/backward) with RW.  Frequent spasms and occasional R LE buckling noted.  Unsafe/unable to tolerate additional mobility at this time due to pain and risk of fall. Unable to demonstrate mobility necessary to facilitate safe discharge home at this time.  May benefit from neuro consult to review MRI results--RN informed/aware. Would benefit from skilled PT to address above deficits and promote optimal return to PLOF; recommend transition to STR upon discharge from acute hospitalization.     Follow Up Recommendations SNF    Equipment Recommendations       Recommendations for Other Services  (Neuro consult to review MRI results and possible intervention?)     Precautions / Restrictions Precautions Precautions: Fall Restrictions Weight Bearing Restrictions: No      Mobility  Bed Mobility Overal bed mobility: Needs Assistance Bed Mobility: Supine to Sit;Sit to Supine     Supine to sit: Min  assist Sit to supine: Mod assist   General bed mobility comments: attempted eduation in log-rolling technique, limited compliance  Transfers Overall transfer level: Needs assistance Equipment used: Rolling walker (2 wheeled) Transfers: Sit to/from Stand Sit to Stand: Mod assist;+2 safety/equipment         General transfer comment: heavy use of UEs to complete movement transition  Ambulation/Gait Ambulation/Gait assistance: Mod assist;+2 safety/equipment Ambulation Distance (Feet): 2 Feet Assistive device: Rolling walker (2 wheeled)       General Gait Details: 2 steps forward/backward, heavy WBing bilat UEs; min assist from therapist to guard/stabilize R LE (to prevent buckling).  Significant spasming with mobility efforts; unable to tolerate additional gait as result.  Stairs            Wheelchair Mobility    Modified Rankin (Stroke Patients Only)       Balance Overall balance assessment: Needs assistance Sitting-balance support: No upper extremity supported;Feet supported Sitting balance-Leahy Scale: Fair     Standing balance support: Bilateral upper extremity supported Standing balance-Leahy Scale: Fair                               Pertinent Vitals/Pain Pain Assessment: 0-10 Pain Score: 8  Pain Location: R low back/hip Pain Descriptors / Indicators: Aching;Spasm;Grimacing;Guarding Pain Intervention(s): Limited activity within patient's tolerance;Monitored during session;Patient requesting pain meds-RN notified    Home Living Family/patient expects to be discharged to:: Private residence Living Arrangements: Alone Available Help at Discharge: Family Type of Home: House Home Access: Level entry     Home Layout: One level        Prior Function Level of Independence: Independent  Comments: Indep with ADLs, household and community activities; has experienced LBP (progressively worsening) for past ten years.  Denies fall  history or LE buckling related to back pain/R LE symptoms./     Hand Dominance        Extremity/Trunk Assessment   Upper Extremity Assessment: Overall WFL for tasks assessed           Lower Extremity Assessment:  (R hip and knee grossly 3-/5 (limited by pain), ankle WFL.  Does endorse paresthesia with intermittent radicular symptoms over L1-L2 dermatomes.  L LE grossly WFL)         Communication   Communication: No difficulties  Cognition Arousal/Alertness: Awake/alert Behavior During Therapy: WFL for tasks assessed/performed Overall Cognitive Status: Within Functional Limits for tasks assessed                      General Comments      Exercises Other Exercises Other Exercises: Educated in log-rolling technique and encouraged performance during session; patient voiced understanding, but demonstrates limited compliance/functional adherence. Other Exercises: Attempted seated lumbar/thoracic extension exercises for pain management (as patient reports this has helped in past)--patient very guarded, very limited ROM noted (due to pain).  Limited relief in symptoms reported this date.      Assessment/Plan    PT Assessment Patient needs continued PT services  PT Diagnosis Difficulty walking;Generalized weakness;Acute pain   PT Problem List Decreased strength;Decreased range of motion;Decreased balance;Decreased mobility;Decreased activity tolerance;Pain;Impaired sensation  PT Treatment Interventions DME instruction;Gait training;Functional mobility training;Stair training;Therapeutic activities;Therapeutic exercise;Balance training;Patient/family education   PT Goals (Current goals can be found in the Care Plan section) Acute Rehab PT Goals Patient Stated Goal: to make the pain better PT Goal Formulation: With patient/family Time For Goal Achievement: 05/21/15 Potential to Achieve Goals: Good    Frequency Min 2X/week   Barriers to discharge Decreased caregiver  support      Co-evaluation               End of Session Equipment Utilized During Treatment: Gait belt Activity Tolerance: Patient limited by pain Patient left: in bed;with call bell/phone within reach;with bed alarm set Nurse Communication: Mobility status;Patient requests pain meds         Time: 1400-1423 PT Time Calculation (min) (ACUTE ONLY): 23 min   Charges:   PT Evaluation $PT Eval High Complexity: 1 Procedure PT Treatments $Therapeutic Exercise: 8-22 mins   PT G Codes:   PT G-Codes **NOT FOR INPATIENT CLASS** Functional Assessment Tool Used: clinical judgement Functional Limitation: Mobility: Walking and moving around Mobility: Walking and Moving Around Current Status [id number]): At least 60 percent but less than 80 percent impaired, limited or restricted Mobility: Walking and Moving Around Goal Status [id number]): At least 1 percent but less than 20 percent impaired, limited or restricted   Roanna Reaves H. Owens Shark, PT, DPT, NCS 05/07/2015, 2:50 PM [phone]

## 2015-05-08 ENCOUNTER — Observation Stay: Payer: Non-veteran care

## 2015-05-08 DIAGNOSIS — G9341 Metabolic encephalopathy: Secondary | ICD-10-CM | POA: Diagnosis not present

## 2015-05-08 DIAGNOSIS — M109 Gout, unspecified: Secondary | ICD-10-CM | POA: Diagnosis present

## 2015-05-08 DIAGNOSIS — K219 Gastro-esophageal reflux disease without esophagitis: Secondary | ICD-10-CM | POA: Diagnosis present

## 2015-05-08 DIAGNOSIS — A408 Other streptococcal sepsis: Secondary | ICD-10-CM | POA: Diagnosis not present

## 2015-05-08 DIAGNOSIS — M5136 Other intervertebral disc degeneration, lumbar region: Secondary | ICD-10-CM | POA: Diagnosis not present

## 2015-05-08 DIAGNOSIS — E785 Hyperlipidemia, unspecified: Secondary | ICD-10-CM | POA: Diagnosis present

## 2015-05-08 DIAGNOSIS — Z794 Long term (current) use of insulin: Secondary | ICD-10-CM | POA: Diagnosis not present

## 2015-05-08 DIAGNOSIS — Z7982 Long term (current) use of aspirin: Secondary | ICD-10-CM | POA: Diagnosis not present

## 2015-05-08 DIAGNOSIS — E1140 Type 2 diabetes mellitus with diabetic neuropathy, unspecified: Secondary | ICD-10-CM | POA: Diagnosis present

## 2015-05-08 DIAGNOSIS — G253 Myoclonus: Secondary | ICD-10-CM | POA: Diagnosis present

## 2015-05-08 DIAGNOSIS — Z833 Family history of diabetes mellitus: Secondary | ICD-10-CM | POA: Diagnosis not present

## 2015-05-08 DIAGNOSIS — N400 Enlarged prostate without lower urinary tract symptoms: Secondary | ICD-10-CM | POA: Diagnosis present

## 2015-05-08 DIAGNOSIS — I480 Paroxysmal atrial fibrillation: Secondary | ICD-10-CM | POA: Diagnosis present

## 2015-05-08 DIAGNOSIS — N179 Acute kidney failure, unspecified: Secondary | ICD-10-CM | POA: Diagnosis not present

## 2015-05-08 DIAGNOSIS — M4807 Spinal stenosis, lumbosacral region: Secondary | ICD-10-CM | POA: Diagnosis present

## 2015-05-08 DIAGNOSIS — E1122 Type 2 diabetes mellitus with diabetic chronic kidney disease: Secondary | ICD-10-CM | POA: Diagnosis present

## 2015-05-08 DIAGNOSIS — Z8261 Family history of arthritis: Secondary | ICD-10-CM | POA: Diagnosis not present

## 2015-05-08 DIAGNOSIS — N189 Chronic kidney disease, unspecified: Secondary | ICD-10-CM | POA: Diagnosis not present

## 2015-05-08 DIAGNOSIS — J9600 Acute respiratory failure, unspecified whether with hypoxia or hypercapnia: Secondary | ICD-10-CM | POA: Diagnosis not present

## 2015-05-08 DIAGNOSIS — I129 Hypertensive chronic kidney disease with stage 1 through stage 4 chronic kidney disease, or unspecified chronic kidney disease: Secondary | ICD-10-CM | POA: Diagnosis present

## 2015-05-08 DIAGNOSIS — R509 Fever, unspecified: Secondary | ICD-10-CM | POA: Diagnosis present

## 2015-05-08 DIAGNOSIS — M5431 Sciatica, right side: Secondary | ICD-10-CM | POA: Diagnosis not present

## 2015-05-08 DIAGNOSIS — B370 Candidal stomatitis: Secondary | ICD-10-CM | POA: Diagnosis not present

## 2015-05-08 DIAGNOSIS — I2699 Other pulmonary embolism without acute cor pulmonale: Secondary | ICD-10-CM | POA: Diagnosis not present

## 2015-05-08 DIAGNOSIS — M549 Dorsalgia, unspecified: Secondary | ICD-10-CM | POA: Diagnosis present

## 2015-05-08 DIAGNOSIS — N183 Chronic kidney disease, stage 3 (moderate): Secondary | ICD-10-CM | POA: Diagnosis present

## 2015-05-08 DIAGNOSIS — Z841 Family history of disorders of kidney and ureter: Secondary | ICD-10-CM | POA: Diagnosis not present

## 2015-05-08 DIAGNOSIS — E875 Hyperkalemia: Secondary | ICD-10-CM | POA: Diagnosis present

## 2015-05-08 DIAGNOSIS — Z79899 Other long term (current) drug therapy: Secondary | ICD-10-CM | POA: Diagnosis not present

## 2015-05-08 DIAGNOSIS — M5116 Intervertebral disc disorders with radiculopathy, lumbar region: Secondary | ICD-10-CM | POA: Diagnosis present

## 2015-05-08 DIAGNOSIS — E860 Dehydration: Secondary | ICD-10-CM | POA: Diagnosis present

## 2015-05-08 DIAGNOSIS — Z8249 Family history of ischemic heart disease and other diseases of the circulatory system: Secondary | ICD-10-CM | POA: Diagnosis not present

## 2015-05-08 LAB — URINALYSIS COMPLETE WITH MICROSCOPIC (ARMC ONLY)
BACTERIA UA: NONE SEEN
Bilirubin Urine: NEGATIVE
NITRITE: NEGATIVE
PROTEIN: NEGATIVE mg/dL
SPECIFIC GRAVITY, URINE: 1.015 (ref 1.005–1.030)
pH: 5 (ref 5.0–8.0)

## 2015-05-08 LAB — CBC
HCT: 32.1 % — ABNORMAL LOW (ref 40.0–52.0)
Hemoglobin: 10.6 g/dL — ABNORMAL LOW (ref 13.0–18.0)
MCH: 26.8 pg (ref 26.0–34.0)
MCHC: 33.1 g/dL (ref 32.0–36.0)
MCV: 81.2 fL (ref 80.0–100.0)
PLATELETS: 288 10*3/uL (ref 150–440)
RBC: 3.96 MIL/uL — ABNORMAL LOW (ref 4.40–5.90)
RDW: 15.1 % — AB (ref 11.5–14.5)
WBC: 15.5 10*3/uL — AB (ref 3.8–10.6)

## 2015-05-08 LAB — INFLUENZA PANEL BY PCR (TYPE A & B)
H1N1 flu by pcr: NOT DETECTED
Influenza A By PCR: NEGATIVE
Influenza B By PCR: NEGATIVE

## 2015-05-08 LAB — BASIC METABOLIC PANEL
ANION GAP: 6 (ref 5–15)
BUN: 46 mg/dL — ABNORMAL HIGH (ref 6–20)
CALCIUM: 8.6 mg/dL — AB (ref 8.9–10.3)
CO2: 19 mmol/L — ABNORMAL LOW (ref 22–32)
CREATININE: 2.07 mg/dL — AB (ref 0.61–1.24)
Chloride: 111 mmol/L (ref 101–111)
GFR, EST AFRICAN AMERICAN: 36 mL/min — AB (ref >60)
GFR, EST NON AFRICAN AMERICAN: 31 mL/min — AB (ref >60)
Glucose, Bld: 252 mg/dL — ABNORMAL HIGH (ref 65–99)
Potassium: 4.1 mmol/L (ref 3.5–5.1)
Sodium: 136 mmol/L (ref 135–145)

## 2015-05-08 LAB — GLUCOSE, CAPILLARY
GLUCOSE-CAPILLARY: 311 mg/dL — AB (ref 65–99)
GLUCOSE-CAPILLARY: 442 mg/dL — AB (ref 65–99)
Glucose-Capillary: 332 mg/dL — ABNORMAL HIGH (ref 65–99)
Glucose-Capillary: 305 mg/dL — ABNORMAL HIGH (ref 65–99)
Glucose-Capillary: 220 mg/dL — ABNORMAL HIGH (ref 65–99)
Glucose-Capillary: 437 mg/dL — ABNORMAL HIGH (ref 65–99)

## 2015-05-08 MED ORDER — INSULIN ASPART 100 UNIT/ML ~~LOC~~ SOLN
0.0000 [IU] | Freq: Three times a day (TID) | SUBCUTANEOUS | Status: DC
  Administered 2015-05-08: 11 [IU] via SUBCUTANEOUS
  Filled 2015-05-08: qty 11

## 2015-05-08 MED ORDER — SODIUM CHLORIDE 0.9 % IV SOLN
INTRAVENOUS | Status: DC
  Administered 2015-05-08 – 2015-05-11 (×5): via INTRAVENOUS

## 2015-05-08 MED ORDER — INSULIN ASPART 100 UNIT/ML ~~LOC~~ SOLN
4.0000 [IU] | Freq: Three times a day (TID) | SUBCUTANEOUS | Status: DC
  Administered 2015-05-08 – 2015-05-17 (×23): 4 [IU] via SUBCUTANEOUS
  Filled 2015-05-08 (×8): qty 4
  Filled 2015-05-08: qty 8
  Filled 2015-05-08 (×15): qty 4

## 2015-05-08 MED ORDER — INSULIN DETEMIR 100 UNIT/ML ~~LOC~~ SOLN
40.0000 [IU] | Freq: Every day | SUBCUTANEOUS | Status: DC
  Administered 2015-05-08 – 2015-05-12 (×5): 40 [IU] via SUBCUTANEOUS
  Filled 2015-05-08 (×6): qty 0.4

## 2015-05-08 MED ORDER — INSULIN ASPART 100 UNIT/ML ~~LOC~~ SOLN
0.0000 [IU] | Freq: Three times a day (TID) | SUBCUTANEOUS | Status: DC
  Administered 2015-05-08: 15 [IU] via SUBCUTANEOUS
  Administered 2015-05-09: 2 [IU] via SUBCUTANEOUS
  Administered 2015-05-09: 5 [IU] via SUBCUTANEOUS
  Administered 2015-05-09: 15 [IU] via SUBCUTANEOUS
  Administered 2015-05-10: 11 [IU] via SUBCUTANEOUS
  Administered 2015-05-10 (×3): 8 [IU] via SUBCUTANEOUS
  Administered 2015-05-11: 3 [IU] via SUBCUTANEOUS
  Administered 2015-05-11: 5 [IU] via SUBCUTANEOUS
  Administered 2015-05-11: 3 [IU] via SUBCUTANEOUS
  Administered 2015-05-11: 2 [IU] via SUBCUTANEOUS
  Administered 2015-05-12 (×2): 8 [IU] via SUBCUTANEOUS
  Administered 2015-05-12: 2 [IU] via SUBCUTANEOUS
  Administered 2015-05-12: 5 [IU] via SUBCUTANEOUS
  Administered 2015-05-13: 3 [IU] via SUBCUTANEOUS
  Administered 2015-05-13: 8 [IU] via SUBCUTANEOUS
  Administered 2015-05-14 (×3): 5 [IU] via SUBCUTANEOUS
  Administered 2015-05-14: 3 [IU] via SUBCUTANEOUS
  Administered 2015-05-15 (×2): 8 [IU] via SUBCUTANEOUS
  Administered 2015-05-15: 11 [IU] via SUBCUTANEOUS
  Administered 2015-05-16: 15 [IU] via SUBCUTANEOUS
  Administered 2015-05-16: 5 [IU] via SUBCUTANEOUS
  Administered 2015-05-16 (×2): 11 [IU] via SUBCUTANEOUS
  Administered 2015-05-17: 3 [IU] via SUBCUTANEOUS
  Administered 2015-05-17: 8 [IU] via SUBCUTANEOUS
  Filled 2015-05-08: qty 5
  Filled 2015-05-08: qty 11
  Filled 2015-05-08: qty 5
  Filled 2015-05-08 (×2): qty 8
  Filled 2015-05-08: qty 5
  Filled 2015-05-08: qty 15
  Filled 2015-05-08: qty 8
  Filled 2015-05-08: qty 3
  Filled 2015-05-08: qty 11
  Filled 2015-05-08 (×2): qty 3
  Filled 2015-05-08: qty 5
  Filled 2015-05-08: qty 8
  Filled 2015-05-08: qty 5
  Filled 2015-05-08: qty 3
  Filled 2015-05-08: qty 15
  Filled 2015-05-08 (×2): qty 2
  Filled 2015-05-08: qty 8
  Filled 2015-05-08: qty 5
  Filled 2015-05-08: qty 3
  Filled 2015-05-08 (×3): qty 8
  Filled 2015-05-08: qty 2
  Filled 2015-05-08 (×2): qty 11
  Filled 2015-05-08: qty 15

## 2015-05-08 MED ORDER — INSULIN ASPART 100 UNIT/ML ~~LOC~~ SOLN: 0.0000 [IU] | Freq: Every day | SUBCUTANEOUS | Status: DC

## 2015-05-08 NOTE — Progress Notes (Signed)
Inpatient Diabetes Program Recommendations  AACE/ADA: New Consensus Statement on Inpatient Glycemic Control (2015)  Target Ranges:  Prepandial:   less than 140 mg/dL      Peak postprandial:   less than 180 mg/dL (1-2 hours)      Critically ill patients:  140 - 180 mg/dL  Results for Eric Glover, Eric Glover (MRN [id number]) as of 05/08/2015 10:19  Ref. Range 05/07/2015 07:31 05/07/2015 11:34 05/07/2015 16:33 05/07/2015 16:46 05/07/2015 19:36 05/08/2015 03:19 05/08/2015 07:23  Glucose-Capillary Latest Ref Range: 65-99 mg/dL 264 (H) 342 (H) 403 (H) 406 (H) 401 (H) 332 (H) 305 (H)   Review of Glycemic Control  Diabetes history: DM2 Outpatient Diabetes medications: Levemir 40 units QHS Current orders for Inpatient glycemic control: Levemir 30 units QHS, Novolog 0-9 units ACHS  Inpatient Diabetes Program Recommendations: Insulin - Basal: Please consider increasing Levemir to 35 units QHS. Insulin - Meal Coverage: If steroids are continued as ordered (Prednisone 40 mg QAM), please consider ordering Novolog 4 units TID with meals for meal coverge in addition to Novolog correction scale.  Thanks, Barnie Alderman, RN, MSN, CDE Diabetes Coordinator Inpatient Diabetes Program [phone] (Team Pager from [place] to [place]) [phone] ([place] office) [phone] [facility] office) [phone] [facility] office)

## 2015-05-08 NOTE — Progress Notes (Addendum)
Physical Therapy Treatment Patient Details Name: Eric Glover MRN: [id number] DOB: 1945-10-20 Today's Date: 05/08/2015    History of Present Illness presented to ER and admitted under observation for acute exacerbation of LBP (with radicular symptoms); also noted in acute/chronic renal failure.    PT Comments    Patient reports less pain initially in this session, likely a result of doses of pain medications he received earlier. Given the strong pain medications provided, no pain pattern was able to be accurately determined in this session. However, patient appears to have LE weakness/spasming, which is concerning given the MRI results of his lumbar spine. He reports marked increase in pain with any flexion as well as increased weight bearing, which is significantly impacting his mobility. He does report no sensory deficits today on exam by PT, denies numbness/tingling anywhere in sitting/supine.  He is able to ambulate further today, however multiple near buckling episodes indicate he is experiencing a combination of pain/weakness impacting all functional mobility. This [name] agrees with previous therapist that neurology consultation is warranted in light of the MRI results and this increased spasming impacting his safety with mobility.   Follow Up Recommendations  SNF     Equipment Recommendations       Recommendations for Other Services  (Agree with previous therapist, patient would benefit from neurological consultation for MRI results)     Precautions / Restrictions Precautions Precautions: Fall Restrictions Weight Bearing Restrictions: No    Mobility  Bed Mobility Overal bed mobility: Needs Assistance Bed Mobility: Supine to Sit;Sit to Supine     Supine to sit: Min guard;HOB elevated Sit to supine: Min assist;HOB elevated   General bed mobility comments: Patient with HOB elevated, is able to bring LEs over threshold. With return to bed, he uses guard rails and  requires min A x1 to bring LEs over the edge secondary to pain/weakness.  Transfers Overall transfer level: Needs assistance Equipment used: Rolling walker (2 wheeled) Transfers: Sit to/from Stand Sit to Stand: Min guard;Min assist         General transfer comment: Patient educated to lean forward as little as possible and use UEs as much as possible. No pain on stand to sit, on sit to stand weakness/buckling of LEs noted.   Ambulation/Gait Ambulation/Gait assistance: Min guard Ambulation Distance (Feet): 30 Feet Assistive device: Rolling walker (2 wheeled)     Gait velocity interpretation: Below normal speed for age/gender General Gait Details: Educated patient to decrease stride length to limit hip extension/flexion. Patient demonstrated spasming and buckling episodes repeatedly throughout ambulation initially, decreased for several steps, then increased. Required a rest break in a chair at the door.    Stairs            Wheelchair Mobility    Modified Rankin (Stroke Patients Only)       Balance Overall balance assessment: Needs assistance Sitting-balance support: No upper extremity supported;Feet supported Sitting balance-Leahy Scale: Good     Standing balance support: Bilateral upper extremity supported Standing balance-Leahy Scale: Fair                      Cognition                            Exercises Other Exercises Other Exercises: Seated lumbo-thoracic extensions x 5, cued for holds of no more than 3 seconds which were tolerable.     General Comments  Pertinent Vitals/Pain Pain Assessment: Faces Faces Pain Scale: Hurts even more Pain Location: R hip Pain Descriptors / Indicators: Aching;Spasm;Grimacing Pain Intervention(s): Limited activity within patient's tolerance;Monitored during session;Repositioned    Home Living                      Prior Function            PT Goals (current goals can now be  found in the care plan section) Acute Rehab PT Goals Patient Stated Goal: to make the pain better PT Goal Formulation: With patient/family Time For Goal Achievement: 05/21/15 Potential to Achieve Goals: Good Progress towards PT goals: Progressing toward goals    Frequency  Min 2X/week    PT Plan Current plan remains appropriate    Co-evaluation             End of Session Equipment Utilized During Treatment: Gait belt Activity Tolerance: Patient limited by pain Patient left: in bed;with call bell/phone within reach;with bed alarm set;with family/visitor present     Time: [id number] PT Time Calculation (min) (ACUTE ONLY): 24 min  Charges:  $Gait Training: 23-37 mins                    G Codes:      Kerman Passey, PT, DPT    05/08/2015, 6:41 PM

## 2015-05-08 NOTE — Progress Notes (Signed)
Spoke with Dr Jannifer Franklin earlier during evening of 05/07/15 concerning BS 401- orders for 9 units Novolog, 30 units Levemir.  Order to check BS at 0300. BS 332. Notified  Dr. Marcille Blanco, no new orders.

## 2015-05-08 NOTE — Progress Notes (Signed)
[facility] [place] NAME: Eric Glover    MR#:  [id number]  DATE OF BIRTH:  08-Mar-1946  SUBJECTIVE:   Patient here due to intractable back pain also noted to be in acute on chronic renal failure. Spiked a fever of 103 this a.m. Still having some back pain.    REVIEW OF SYSTEMS:    Review of Systems  Constitutional: Positive for fever. Negative for chills.  HENT: Negative for congestion and tinnitus.   Eyes: Negative for blurred vision and double vision.  Respiratory: Negative for cough, shortness of breath and wheezing.   Cardiovascular: Negative for chest pain, orthopnea and PND.  Gastrointestinal: Negative for nausea, vomiting, abdominal pain and diarrhea.  Genitourinary: Negative for dysuria and hematuria.  Musculoskeletal: Positive for back pain.  Neurological: Positive for weakness (Generalized. ). Negative for dizziness, sensory change and focal weakness.  All other systems reviewed and are negative.   Nutrition: Heart healthy/Carb control Tolerating Diet: yes Tolerating PT:  Await Eval.    DRUG ALLERGIES:   Allergies  Allergen Reactions  . Neomycin-Bacitracin Zn-Polymyx Rash    VITALS:  Blood pressure 109/60, pulse 122, temperature 98.2 F (36.8 C), temperature source Axillary, resp. rate 22, height 6\' 2"  (1.88 m), weight 102.785 kg (226 lb 9.6 oz), SpO2 100 %.  PHYSICAL EXAMINATION:   Physical Exam  GENERAL:  70 y.o.-year-old patient lying in the bed in no acute distress.  EYES: Pupils equal, round, reactive to light and accommodation. No scleral icterus. Extraocular muscles intact.  HEENT: Head atraumatic, normocephalic. Oropharynx and nasopharynx clear. Moist oral mucosa NECK:  Supple, no jugular venous distention. No thyroid enlargement, no tenderness.  LUNGS: Normal breath sounds bilaterally, no wheezing, rales, rhonchi. No use of accessory muscles of respiration.  CARDIOVASCULAR: S1, S2 normal. No  murmurs, rubs, or gallops.  ABDOMEN: Soft, nontender, nondistended. Bowel sounds present. No organomegaly or mass.  EXTREMITIES: No cyanosis, clubbing or edema b/l.    NEUROLOGIC: Cranial nerves II through XII are intact. No focal Motor or sensory deficits b/l.   PSYCHIATRIC: The patient is alert and oriented x 3.  SKIN: No obvious rash, lesion, or ulcer.    LABORATORY PANEL:   CBC  Recent Labs Lab 05/08/15 0932  WBC 15.5*  HGB 10.6*  HCT 32.1*  PLT 288   ------------------------------------------------------------------------------------------------------------------  Chemistries   Recent Labs Lab 05/06/15 1540  05/08/15 0932  NA 131*  < > 136  K 5.5*  < > 4.1  CL 105  < > 111  CO2 18*  < > 19*  GLUCOSE 331*  < > 252*  BUN 57*  < > 46*  CREATININE 2.16*  < > 2.07*  CALCIUM 8.7*  < > 8.6*  AST 28  --   --   ALT 22  --   --   ALKPHOS 67  --   --   BILITOT 1.1  --   --   < > = values in this interval not displayed. ------------------------------------------------------------------------------------------------------------------  Cardiac Enzymes No results for input(s): TROPONINI in the last 168 hours. ------------------------------------------------------------------------------------------------------------------  RADIOLOGY:  Dg Chest 1 View  05/08/2015  CLINICAL DATA:  Fever of unknown origin EXAM: CHEST 1 VIEW COMPARISON:  02/07/2015 FINDINGS: Cardiac shadow is within normal limits. Lungs are well aerated bilaterally. Some minimal platelike atelectasis is noted in the left base. No focal confluent infiltrate is noted. No bony abnormality is seen. IMPRESSION: No acute abnormality noted. Electronically Signed   By:  Inez Catalina M.D.   On: 05/08/2015 09:56   Dg Lumbar Spine Complete  05/06/2015  CLINICAL DATA:  70 year old presenting with low back pain, muscle spasms and radicular pain into the right lower extremity which began yesterday. EXAM: LUMBAR SPINE -  COMPLETE 4+ VIEW COMPARISON:  None. FINDINGS: Five non rib-bearing lumbar vertebrae with anatomic posterior alignment. No fractures. Mild thoracolumbar scoliosis convex right. Disc space narrowing and endplate hypertrophic changes at every lumbar level, worst at L1-2 and L5-S1. Bridging osteophytes at multiple lower thoracic and lumbar levels. No pars defects. Facet degenerative changes at L4-5 and L5-S1, left greater than right. Sacroiliac joints intact. Aortoiliac atherosclerosis without aneurysm. IMPRESSION: 1. No acute osseous abnormality. 2. Multilevel degenerative disc disease, spondylosis and facet degenerative changes as detailed above. Electronically Signed   By: Evangeline Dakin M.D.   On: 05/06/2015 21:09   Ct Lumbar Spine Wo Contrast  05/07/2015  CLINICAL DATA:  Acute worsening low back pain. Chronic back pain, worsening over the last 3 days. Difficulty standing today. Low right-sided back pain which sometimes radiates down leg. EXAM: CT LUMBAR SPINE WITHOUT CONTRAST TECHNIQUE: Multidetector CT imaging of the lumbar spine was performed without intravenous contrast administration. Multiplanar CT image reconstructions were also generated. COMPARISON:  Plain film of the lumbar spine dated 05/06/2015. FINDINGS: Dextroscoliosis of the lumbar spine, centered at the at L2-3 level. No acute- appearing osseous abnormality. Degenerative changes are seen throughout the lumbar spine. Disc bulges are seen at every level, most severe at the L5-S1 level causing moderate central canal stenosis and probable right-sided nerve root impingement. Additional disc bulges at the L1-2 thru L4-5 levels causing mild to moderate central canal stenoses and mild to moderate neural foramen narrowings without obvious nerve root impingement. Atherosclerotic changes noted along the walls of the normal- caliber abdominal aorta. Visualized paravertebral soft tissues are otherwise unremarkable. IMPRESSION: 1. Multilevel degenerative disc  disease, most severe at the L5-S1 level where a disc bulge is causing moderate central canal stenosis and moderate-to-severe right neural foramen narrowing with probable associated nerve root impingement. This is the likely cause of patient's symptoms. 2. Dextroscoliosis of the lumbar spine. 3. No acute findings. Electronically Signed   By: Franki Cabot M.D.   On: 05/07/2015 11:24     ASSESSMENT AND PLAN:   70 year old male with past medical history of diabetes type 2 without complication, GERD, hypertension, hyperlipidemia, chronic kidney disease stage III, gout who presents to the hospital with back pain and noted to be in acute on chronic renal failure.  #1 intractable back pain-patient's x-ray on admission showed some mild DJD.  - CT scan of the lumbar spine showed significant disc bulge at L5-S1 level along with central canal stenosis and some neural foramen narrowing and nerve impingement. -Continue supportive care with prednisone, muscle relaxants, Neurontin. -Discussed with Dr. Andree Elk from anesthesiology and he plans on maybe doing a epidural steroid injection tomorrow at the pain management clinic. - await PT eval.  -Obtain physical therapy consult.  #2 fever of unknown origin-patient spiked a fever of 103 this morning. He clinically denies any symptoms. -Chest x-ray negative. Await UA.  BC drawn and will follow fever curve which has improved.  - hold off on abx for now.   # 3 acute on chronic renal failure-Likely due to dehydration.  - cont. IV fluids and Cr. Is improving and will monitor.   #4 Hyperkalemia - mild and due to acute on chronic renal failure.  - improved as renal function is  improving.   #5 DM Type II w/out complication - BS a bit elevated due to steroids.  - cont. Levemir, and will add Novolog with meals and cont.  SSI  #6 BPH - cont. Doxazosin.    #7 Hyperlipidemia - cont. Hyperlipidemia.    All the records are reviewed and case discussed with Care  Management/Social Workerr. Management plans discussed with the patient, family and they are in agreement.  CODE STATUS: Full  DVT Prophylaxis: Heparin SQ  TOTAL TIME TAKING CARE OF THIS PATIENT: 30 minutes.   POSSIBLE D/C IN 1-2 DAYS, DEPENDING ON CLINICAL CONDITION.   Henreitta Leber M.D on 05/08/2015 at 3:02 PM  Between 7am to 6pm - Pager - [phone]  After 6pm go to [email or web address] - password [email or web address] [facility]  Office  [phone]  CC: Primary care physician; Ashok Norris, MD

## 2015-05-08 NOTE — Progress Notes (Signed)
PT Cancellation Note  Patient Details Name: Eric Glover MRN: [id number] DOB: [birth year]   Cancelled Treatment:    Reason Eval/Treat Not Completed: Patient declined, no reason specified. Patient has had morphine earlier in the day and slept through lunch. PT entered the room at roughly 3:30 PM to find patient had just woken up, currently starting to eat lunch. Patient asked PT to return at a later date/time.   Kerman Passey, PT, DPT    05/08/2015, 4:36 PM

## 2015-05-08 NOTE — Progress Notes (Signed)
Initial Nutrition Assessment      INTERVENTION:  Meals and snacks: Cater to pt preferences Medical Nutritional Supplement Therapy: If unable to meet nutritional needs will add supplement   NUTRITION DIAGNOSIS:    (none at this time) related to   as evidenced by  .    GOAL:   Patient will meet greater than or equal to 90% of their needs    MONITOR:    (Energy intake)  REASON FOR ASSESSMENT:   Diagnosis    ASSESSMENT:      Pt admitted with ARF, back pain  Past Medical History  Diagnosis Date  . Diabetes mellitus without complication ([place])   . GERD (gastroesophageal reflux disease)   . Hyperlipidemia   . Hypertension   . CKD (chronic kidney disease), stage III   . Gout     Current Nutrition: eating 100% of meals per I and O sheet although pt did not eat lunch per student nurse.  Pt sleeping during visit. Had received pain medication secondary to back pain  Food/Nutrition-Related History: unsure intake prior to admission   Scheduled Medications:  . aspirin EC  81 mg Oral Daily  . atorvastatin  40 mg Oral q1800  . budesonide (PULMICORT) nebulizer solution  0.25 mg Nebulization BID  . doxazosin  8 mg Oral Daily  . gabapentin  300 mg Oral TID  . insulin aspart  0-15 Units Subcutaneous TID WC  . insulin aspart  0-5 Units Subcutaneous QHS  . insulin aspart  4 Units Subcutaneous TID WC  . insulin detemir  40 Units Subcutaneous QHS  . pantoprazole  40 mg Oral Daily  . predniSONE  40 mg Oral Q breakfast    Continuous Medications:  . sodium chloride 100 mL/hr at 05/08/15 0936     Electrolyte/Renal Profile and Glucose Profile:   Recent Labs Lab 05/06/15 1540 05/07/15 0456 05/08/15 0932  NA 131* 135 136  K 5.5* 5.3* 4.1  CL 105 109 111  CO2 18* 15* 19*  BUN 57* 44* 46*  CREATININE 2.16* 1.75* 2.07*  CALCIUM 8.7* 8.4* 8.6*  GLUCOSE 331* 307* 252*   Protein Profile:  Recent Labs Lab 05/06/15 1540  ALBUMIN 3.8    Gastrointestinal  Profile: Last BM: 2/11   Weight Change: 5% wt loss in the last 4 months    Diet Order:  Diet heart healthy/carb modified Room service appropriate?: Yes; Fluid consistency:: Thin  Skin:   reviewed   Height:   Ht Readings from Last 1 Encounters:  05/06/15 6\' 2"  (1.88 m)    Weight:   Wt Readings from Last 1 Encounters:  05/06/15 226 lb 9.6 oz (102.785 kg)    Ideal Body Weight:     BMI:  Body mass index is 29.08 kg/(m^2).   EDUCATION NEEDS:   No education needs identified at this time  [place]. Zenia Resides, [place], [place] (pager) Weekend/On-Call pager [phone])

## 2015-05-08 NOTE — Clinical Social Work Note (Signed)
Clinical Social Work Assessment  Patient Details  Name: Eric Glover MRN: [id number] Date of Birth: January 03, 1946  Date of referral:  05/08/15               Reason for consult:  Facility Placement                Permission sought to share information with:    Permission granted to share information::     Name::        Agency::     Relationship::     Contact Information:     Housing/Transportation Living arrangements for the past 2 months:  Single Family Home Source of Information:  Patient Patient Interpreter Needed:  None Criminal Activity/Legal Involvement Pertinent to Current Situation/Hospitalization:  No - Comment as needed Significant Relationships:  Spouse, Other(Comment) (granddaughter) Lives with:  Spouse Do you feel safe going back to the place where you live?  Yes Need for family participation in patient care:  Yes (Comment)  Care giving concerns:  Patient resides with his wife and granddaughter.   Social Worker assessment / plan:  CSW met with patient this afternoon and patient was very pleasant. Patient states that he is not interested in rehab in a facility at this time and believes he is safe to return home and that he will be able to manage at home. Patient states he would be open to home health. Patient states that he has a walker available to him but that he does not have to use it typically. RN CM has been notified of patient's agreement to home health.  Employment status:  Disabled (Comment on whether or not currently receiving Disability) Insurance information:  Medicare PT Recommendations:  [place] / Referral to community resources:     Patient/Family's Response to care:  Patient very pleasant and cooperative.  Patient/Family's Understanding of and Emotional Response to Diagnosis, Current Treatment, and Prognosis:  Patient believes that he is able to care for himself at home.  Emotional Assessment Appearance:  Appears  younger than stated age Attitude/Demeanor/Rapport:   (pleasant and cooperative) Affect (typically observed):  Calm Orientation:  Oriented to Self, Oriented to Place, Oriented to  Time, Oriented to Situation Alcohol / Substance use:  Not Applicable Psych involvement (Current and /or in the community):  No (Comment)  Discharge Needs  Concerns to be addressed:  Care Coordination Readmission within the last 30 days:  No Current discharge risk:  None Barriers to Discharge:  No Barriers Identified   Shela Leff, LCSW 05/08/2015, 3:25 PM

## 2015-05-09 ENCOUNTER — Encounter: Payer: Self-pay | Admitting: *Deleted

## 2015-05-09 ENCOUNTER — Inpatient Hospital Stay (HOSPITAL_BASED_OUTPATIENT_CLINIC_OR_DEPARTMENT_OTHER): Payer: Non-veteran care | Admitting: Anesthesiology

## 2015-05-09 VITALS — BP 158/68 | HR 64 | Resp 18

## 2015-05-09 DIAGNOSIS — M5431 Sciatica, right side: Secondary | ICD-10-CM

## 2015-05-09 DIAGNOSIS — M5386 Other specified dorsopathies, lumbar region: Secondary | ICD-10-CM | POA: Diagnosis present

## 2015-05-09 DIAGNOSIS — N179 Acute kidney failure, unspecified: Secondary | ICD-10-CM

## 2015-05-09 DIAGNOSIS — N189 Chronic kidney disease, unspecified: Secondary | ICD-10-CM

## 2015-05-09 DIAGNOSIS — M5136 Other intervertebral disc degeneration, lumbar region: Secondary | ICD-10-CM | POA: Diagnosis present

## 2015-05-09 LAB — BLOOD CULTURE ID PANEL (REFLEXED)
ACINETOBACTER BAUMANNII: NOT DETECTED
CANDIDA ALBICANS: NOT DETECTED
CANDIDA GLABRATA: NOT DETECTED
CANDIDA KRUSEI: NOT DETECTED
CANDIDA PARAPSILOSIS: NOT DETECTED
CANDIDA TROPICALIS: NOT DETECTED
CARBAPENEM RESISTANCE: NOT DETECTED
ENTEROBACTER CLOACAE COMPLEX: NOT DETECTED
ENTEROBACTERIACEAE SPECIES: NOT DETECTED
ESCHERICHIA COLI: NOT DETECTED
Enterococcus species: NOT DETECTED
Haemophilus influenzae: NOT DETECTED
KLEBSIELLA OXYTOCA: NOT DETECTED
KLEBSIELLA PNEUMONIAE: NOT DETECTED
Listeria monocytogenes: NOT DETECTED
Methicillin resistance: NOT DETECTED
NEISSERIA MENINGITIDIS: NOT DETECTED
PROTEUS SPECIES: NOT DETECTED
Pseudomonas aeruginosa: NOT DETECTED
STREPTOCOCCUS AGALACTIAE: NOT DETECTED
STREPTOCOCCUS PNEUMONIAE: NOT DETECTED
STREPTOCOCCUS PYOGENES: NOT DETECTED
Serratia marcescens: NOT DETECTED
Staphylococcus aureus (BCID): NOT DETECTED
Staphylococcus species: NOT DETECTED
Streptococcus species: DETECTED — AB
Vancomycin resistance: NOT DETECTED

## 2015-05-09 LAB — BASIC METABOLIC PANEL
ANION GAP: 8 (ref 5–15)
BUN: 47 mg/dL — ABNORMAL HIGH (ref 6–20)
CALCIUM: 8 mg/dL — AB (ref 8.9–10.3)
CO2: 18 mmol/L — ABNORMAL LOW (ref 22–32)
Chloride: 105 mmol/L (ref 101–111)
Creatinine, Ser: 1.71 mg/dL — ABNORMAL HIGH (ref 0.61–1.24)
GFR, EST AFRICAN AMERICAN: 45 mL/min — AB (ref >60)
GFR, EST NON AFRICAN AMERICAN: 39 mL/min — AB (ref >60)
Glucose, Bld: 159 mg/dL — ABNORMAL HIGH (ref 65–99)
POTASSIUM: 4.2 mmol/L (ref 3.5–5.1)
Sodium: 131 mmol/L — ABNORMAL LOW (ref 135–145)

## 2015-05-09 LAB — CBC
HCT: 32 % — ABNORMAL LOW (ref 40.0–52.0)
Hemoglobin: 10.7 g/dL — ABNORMAL LOW (ref 13.0–18.0)
MCH: 27.4 pg (ref 26.0–34.0)
MCHC: 33.4 g/dL (ref 32.0–36.0)
MCV: 81.9 fL (ref 80.0–100.0)
PLATELETS: 277 10*3/uL (ref 150–440)
RBC: 3.91 MIL/uL — AB (ref 4.40–5.90)
RDW: 15 % — AB (ref 11.5–14.5)
WBC: 8.8 10*3/uL (ref 3.8–10.6)

## 2015-05-09 LAB — GLUCOSE, CAPILLARY
GLUCOSE-CAPILLARY: 147 mg/dL — AB (ref 65–99)
GLUCOSE-CAPILLARY: 193 mg/dL — AB (ref 65–99)
GLUCOSE-CAPILLARY: 201 mg/dL — AB (ref 65–99)
GLUCOSE-CAPILLARY: 211 mg/dL — AB (ref 65–99)
GLUCOSE-CAPILLARY: 447 mg/dL — AB (ref 65–99)
Glucose-Capillary: 144 mg/dL — ABNORMAL HIGH (ref 65–99)
Glucose-Capillary: 375 mg/dL — ABNORMAL HIGH (ref 65–99)

## 2015-05-09 MED ORDER — SODIUM CHLORIDE 0.9 % IJ SOLN
INTRAMUSCULAR | Status: AC
  Filled 2015-05-09: qty 10

## 2015-05-09 MED ORDER — LEVOFLOXACIN IN D5W 750 MG/150ML IV SOLN
750.0000 mg | Freq: Once | INTRAVENOUS | Status: AC
  Administered 2015-05-09: 750 mg via INTRAVENOUS
  Filled 2015-05-09: qty 150

## 2015-05-09 MED ORDER — DEXTROSE 5 % IV SOLN
2.0000 g | INTRAVENOUS | Status: DC
  Administered 2015-05-09 – 2015-05-16 (×8): 2 g via INTRAVENOUS
  Filled 2015-05-09 (×11): qty 2

## 2015-05-09 MED ORDER — GABAPENTIN 300 MG PO CAPS: 300.0000 mg | ORAL_CAPSULE | Freq: Every day | ORAL | Status: DC

## 2015-05-09 MED ORDER — ROPIVACAINE HCL 2 MG/ML IJ SOLN
INTRAMUSCULAR | Status: AC
  Administered 2015-05-09: 16:00:00
  Filled 2015-05-09: qty 10

## 2015-05-09 MED ORDER — LIDOCAINE HCL (PF) 1 % IJ SOLN
INTRAMUSCULAR | Status: AC
  Administered 2015-05-09: 16:00:00
  Filled 2015-05-09: qty 5

## 2015-05-09 MED ORDER — IOHEXOL 180 MG/ML  SOLN
INTRAMUSCULAR | Status: AC
  Filled 2015-05-09: qty 20

## 2015-05-09 MED ORDER — INSULIN ASPART 100 UNIT/ML ~~LOC~~ SOLN
10.0000 [IU] | Freq: Once | SUBCUTANEOUS | Status: AC
Start: 2015-05-09 — End: 2015-05-09
  Administered 2015-05-09: 10 [IU] via SUBCUTANEOUS
  Filled 2015-05-09: qty 10

## 2015-05-09 MED ORDER — TRIAMCINOLONE ACETONIDE 40 MG/ML IJ SUSP
INTRAMUSCULAR | Status: AC
  Administered 2015-05-09: 16:00:00
  Filled 2015-05-09: qty 1

## 2015-05-09 MED ORDER — SODIUM CHLORIDE 0.9 % IJ SOLN
INTRAMUSCULAR | Status: AC
  Administered 2015-05-09: 16:00:00
  Filled 2015-05-09: qty 10

## 2015-05-09 MED ORDER — MIDAZOLAM HCL 5 MG/5ML IJ SOLN
INTRAMUSCULAR | Status: AC
  Administered 2015-05-09: 2 mg via INTRAVENOUS
  Filled 2015-05-09: qty 5

## 2015-05-09 NOTE — Progress Notes (Signed)
  Procedure: L5S1  LESI with fluoroscopic guidance and moderate sedation.Marland KitchenMarland KitchenFull note to follow  NOTE: The risks, benefits, and expectations of the procedure have been discussed and explained to the patient who was understanding and in agreement with suggested treatment plan. No guarantees were made.  DESCRIPTION OF PROCEDURE: Lumbar epidural steroid injection with IV 2 mg Versed, EKG, blood pressure, pulse, and pulse oximetry monitoring. The procedure was performed with the patient in the prone position under fluoroscopic guidance. A local anesthetic skin wheal of 1.5% plain lidocaine was performed at the appropriate site after fluoroscopic identifictation  Using strict aseptic technique, I then advanced an 18-gauge Tuohy epidural needle in the midline via loss-of-resistance to saline. There was negative aspiration for heme or  CSF.  I then confirmed position with both AP and Lateral fluoroscan. At L5S1,   A total of 5 mL of Preservative-Free normal saline with 40 mg of Kenalog and 1cc Ropicaine 0.2 percent was injected incrementally via the  epidurally placed needle. Needle removed. The patient tolerated the injection well and was convalesced and discharged to home in stable condition. If the patient has any post procedure difficulty they have been instructed on how to contact us for assistance.   @James  Andree Elk, MD@

## 2015-05-09 NOTE — Progress Notes (Signed)
Pt had Lumbar epidural today- Versed 2mg  IV given by JSmith, RN Pt received total 100cc LR then was converted back to SL- by JSmith, RN new site was started Pt changed depends of Urine

## 2015-05-09 NOTE — Progress Notes (Signed)
Pharmacy Antibiotic Follow-up Note  Eric Glover is a 70 y.o. year-old male admitted on 05/06/2015.  The patient received one dose of levofloxacin 750 mg IV x1 today prior to procedure.   Assessment/Plan: BCID results were discussed with Dr. Darvin Neighbours and the patient's antibiotics will be changed to ceftriaxone 2 g IV q24h (no current abx). Further narrowing will be determined based on finalized susceptibilities.  Temp (24hrs), Avg:98.7 F (37.1 C), Min:97.9 F (36.6 C), Max:100.5 F (38.1 C)   Recent Labs Lab 05/06/15 1540 05/07/15 0456 05/08/15 0932 05/09/15 0545  WBC 10.7* 11.1* 15.5* 8.8    Recent Labs Lab 05/06/15 1540 05/07/15 0456 05/08/15 0932 05/09/15 0545  CREATININE 2.16* 1.75* 2.07* 1.71*   Estimated Creatinine Clearance: 53.1 mL/min (by C-G formula based on Cr of 1.71).    Allergies  Allergen Reactions  . Neomycin-Bacitracin Zn-Polymyx Rash    Antimicrobials this admission: Levofloxacin 2/14>>2/14 Ceftriaxone 2/14>>    Microbiology results: 2/13 BCx: both anaerobic bottles with streptococcus spp per BCID report called by micro lab   Thank you for allowing pharmacy to be a part of this patient's care.  Rocky Morel PharmD 05/09/2015 8:07 PM

## 2015-05-09 NOTE — Patient Instructions (Signed)
Epidural Steroid Injection An epidural steroid injection is given to relieve pain in your neck, back, or legs that is caused by the irritation or swelling of a nerve root. This procedure involves injecting a steroid and numbing medicine (anesthetic) into the epidural space. The epidural space is the space between the outer covering of your spinal cord and the bones that form your backbone (vertebra).  LET YOUR HEALTH CARE PROVIDER KNOW ABOUT:   Any allergies you have.  All medicines you are taking, including vitamins, herbs, eye drops, creams, and over-the-counter medicines such as aspirin.  Previous problems you or members of your family have had with the use of anesthetics.  Any blood disorders or blood clotting disorders you have.  Previous surgeries you have had.  Medical conditions you have. RISKS AND COMPLICATIONS Generally, this is a safe procedure. However, as with any procedure, complications can occur. Possible complications of epidural steroid injection include:  Headache.  Bleeding.  Infection.  Allergic reaction to the medicines.  Damage to your nerves. The response to this procedure depends on the underlying cause of the pain and its duration. People who have long-term (chronic) pain are less likely to benefit from epidural steroids than are those people whose pain comes on strong and suddenly. BEFORE THE PROCEDURE   Ask your health care provider about changing or stopping your regular medicines. You may be advised to stop taking blood-thinning medicines a few days before the procedure.  You may be given medicines to reduce anxiety.  Arrange for someone to take you home after the procedure. PROCEDURE   You will remain awake during the procedure. You may receive medicine to make you relaxed.  You will be asked to lie on your stomach.  The injection site will be cleaned.  The injection site will be numbed with a medicine (local anesthetic).  A needle will be  injected through your skin into the epidural space.  Your health care provider will use an X-ray machine to ensure that the steroid is delivered closest to the affected nerve. You may have minimal discomfort at this time.  Once the needle is in the right position, the local anesthetic and the steroid will be injected into the epidural space.  The needle will then be removed and a bandage will be applied to the injection site. AFTER THE PROCEDURE   You may be monitored for a short time before you go home.  You may feel weakness or numbness in your arm or leg, which disappears within hours.  You may be allowed to eat, drink, and take your regular medicine.  You may have soreness at the site of the injection.   This information is not intended to replace advice given to you by your health care provider. Make sure you discuss any questions you have with your health care provider.   Document Released: 06/18/2007 Document Revised: 11/11/2012 Document Reviewed: 08/28/2012 Elsevier Interactive Patient Education 2016 [employer] Pain Management Discharge Instructions  General Discharge Instructions :  If you need to reach your doctor call: Monday-Friday 8:00 am - 4:00 pm at [phone] or toll free [phone].  After clinic hours [phone] to have operator reach doctor.  Bring all of your medication bottles to all your appointments in the pain clinic.  To cancel or reschedule your appointment with Pain Management please remember to call 24 hours in advance to avoid a fee.  Refer to the educational materials which you have been given on: General Risks, I had my Procedure.   Discharge Instructions, Post Sedation.  Post Procedure Instructions:  The drugs you were given will stay in your system until tomorrow, so for the next 24 hours you should not drive, make any legal decisions or drink any alcoholic beverages.  You may eat anything you prefer, but it is better to start with  liquids then soups and crackers, and gradually work up to solid foods.  Please notify your doctor immediately if you have any unusual bleeding, trouble breathing or pain that is not related to your normal pain.  Depending on the type of procedure that was done, some parts of your body may feel week and/or numb.  This usually clears up by tonight or the next day.  Walk with the use of an assistive device or accompanied by an adult for the 24 hours.  You may use ice on the affected area for the first 24 hours.  Put ice in a Ziploc bag and cover with a towel and place against area 15 minutes on 15 minutes off.  You may switch to heat after 24 hours. 

## 2015-05-09 NOTE — Progress Notes (Addendum)
[place] [facility] [place] NAME: Eric Glover    MR#:  [id number]  DATE OF BIRTH:  [birth year]  SUBJECTIVE:   Patient here due to intractable back pain also noted to be in acute on chronic renal failure. Had a low grade fever of 100.5 today but otherwise still complaining of back pain. Going for epidural injection later today.    REVIEW OF SYSTEMS:    Review of Systems  Constitutional: Positive for fever. Negative for chills.  HENT: Negative for congestion and tinnitus.   Eyes: Negative for blurred vision and double vision.  Respiratory: Negative for cough, shortness of breath and wheezing.   Cardiovascular: Negative for chest pain, orthopnea and PND.  Gastrointestinal: Negative for nausea, vomiting, abdominal pain and diarrhea.  Genitourinary: Negative for dysuria and hematuria.  Musculoskeletal: Positive for back pain.  Neurological: Positive for weakness (Generalized. ). Negative for dizziness, sensory change and focal weakness.  All other systems reviewed and are negative.   Nutrition: Heart healthy/Carb control Tolerating Diet: yes Tolerating PT:  Eval noted.   DRUG ALLERGIES:   Allergies  Allergen Reactions  . Neomycin-Bacitracin Zn-Polymyx Rash    VITALS:  Blood pressure 112/63, pulse 70, temperature 99.2 F (37.3 C), temperature source Oral, resp. rate 18, height 6\' 2"  (1.88 m), weight 102.785 kg (226 lb 9.6 oz), SpO2 92 %.  PHYSICAL EXAMINATION:   Physical Exam  GENERAL:  70 y.o.-year-old patient lying in the bed in no acute distress.  EYES: Pupils equal, round, reactive to light and accommodation. No scleral icterus. Extraocular muscles intact.  HEENT: Head atraumatic, normocephalic. Oropharynx and nasopharynx clear. Moist oral mucosa NECK:  Supple, no jugular venous distention. No thyroid enlargement, no tenderness.  LUNGS: Normal breath sounds bilaterally, no wheezing, rales, rhonchi. No use of accessory  muscles of respiration.  CARDIOVASCULAR: S1, S2 normal. No murmurs, rubs, or gallops.  ABDOMEN: Soft, nontender, nondistended. Bowel sounds present. No organomegaly or mass.  EXTREMITIES: No cyanosis, clubbing or edema b/l.    NEUROLOGIC: Cranial nerves II through XII are intact. No focal Motor or sensory deficits b/l. Globally weak due to back pain. Myoclonic Jerks noted.  PSYCHIATRIC: The patient is alert and oriented x 3.  SKIN: No obvious rash, lesion, or ulcer.    LABORATORY PANEL:   CBC  Recent Labs Lab 05/09/15 0545  WBC 8.8  HGB 10.7*  HCT 32.0*  PLT 277   ------------------------------------------------------------------------------------------------------------------  Chemistries   Recent Labs Lab 05/06/15 1540  05/09/15 0545  NA 131*  < > 131*  K 5.5*  < > 4.2  CL 105  < > 105  CO2 18*  < > 18*  GLUCOSE 331*  < > 159*  BUN 57*  < > 47*  CREATININE 2.16*  < > 1.71*  CALCIUM 8.7*  < > 8.0*  AST 28  --   --   ALT 22  --   --   ALKPHOS 67  --   --   BILITOT 1.1  --   --   < > = values in this interval not displayed. ------------------------------------------------------------------------------------------------------------------  Cardiac Enzymes No results for input(s): TROPONINI in the last 168 hours. ------------------------------------------------------------------------------------------------------------------  RADIOLOGY:  Dg Chest 1 View  05/08/2015  CLINICAL DATA:  Fever of unknown origin EXAM: CHEST 1 VIEW COMPARISON:  02/07/2015 FINDINGS: Cardiac shadow is within normal limits. Lungs are well aerated bilaterally. Some minimal platelike atelectasis is noted in the left base. No focal confluent infiltrate is  noted. No bony abnormality is seen. IMPRESSION: No acute abnormality noted. Electronically Signed   By: Inez Catalina M.D.   On: 05/08/2015 09:56     ASSESSMENT AND PLAN:   70 year old male with past medical history of diabetes type 2 without  complication, GERD, hypertension, hyperlipidemia, chronic kidney disease stage III, gout who presents to the hospital with back pain and noted to be in acute on chronic renal failure.  #1 intractable back pain-patient's x-ray on admission showed some mild DJD.  - CT scan of the lumbar spine showed significant disc bulge at L5-S1 level along with central canal stenosis and some neural foramen narrowing and nerve impingement. -Continue supportive care with prednisone, muscle relaxants, neurontin. -Discussed with Dr. Andree Elk from anesthesiology this a.m. and he plans on doing a epidural injection today at the pain management clinic. - PT eval noted but pt. Refuses SNF.    #2 fever of unknown origin-patient spiked a fever of 103 yest. morning. He clinically denies any symptoms. -Chest x-ray negative. UA, BC are (-) so far.  Low grade today.  Empirically given one dose of Levaquin prior to procedure today.  # 3 acute on chronic renal failure-Likely due to dehydration.  - cont. IV fluids and Cr. Is improving and will cont. To monitor.   #4 Hyperkalemia - mild and due to acute on chronic renal failure.  - resolved now.   #5 DM Type II w/out complication - BS a bit elevated due to steroids.  - cont. Levemir, Novolog with meals and cont.  SSI  #6 BPH - cont. Doxazosin.    #7 Hyperlipidemia - cont. Hyperlipidemia.   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  - will taper Neurontin.    All the records are reviewed and case discussed with Care Management/Social Workerr. Management plans discussed with the patient, family and they are in agreement.  CODE STATUS: Full  DVT Prophylaxis: TED's and SCD's.   TOTAL TIME TAKING CARE OF THIS PATIENT: 30 minutes.   POSSIBLE D/C IN 1-2 DAYS, DEPENDING ON CLINICAL CONDITION.   Henreitta Leber M.D on 05/09/2015 at 2:10 PM  Between 7am to 6pm - Pager - [phone]  After 6pm go to [email or web address]  - password [email or web address] [facility]  Office  [phone]  CC: Primary care physician; Ashok Norris, MD

## 2015-05-09 NOTE — Care Management Note (Signed)
Case Management Note  Patient Details  Name: Eric Glover MRN: [id number] Date of Birth: [birth year]  Subjective/Objective:                 Patient admitted from home with kidney failure and back pain.  Patient states that his lives at home with his wife, and at baseline is independent.  Patient obtains his medications at the [facility] on [address] As well as the [place].  Patient states that his grandaughter and great daughter also lives in the home.  PT has recommended SNF.  Patient has declined SNF placement, however is open to having home health come into the home.  I have faxed patient information to [facility] at [place].  She has informed me that the patient does to the [facility].  Message left for Fredderick Phenix who is out of the office and will return 05/10/15.  Anticipating need for arranging home health at time of discharge.  [facility]     Action/Plan:   Expected Discharge Date:                  Expected Discharge Plan:     In-House Referral:     Discharge planning Services     Post Acute Care Choice:    Choice offered to:     DME Arranged:    DME Agency:     HH Arranged:    [place] Agency:     Status of Service:     Medicare Important Message Given:    Date Medicare IM Given:    Medicare IM give by:    Date Additional Medicare IM Given:    Additional Medicare Important Message give by:     If discussed at [place] of Stay Meetings, dates discussed:    Additional Comments:  Beverly Sessions, RN 05/09/2015, 1:10 PM

## 2015-05-09 NOTE — Progress Notes (Signed)
Subjective:  Patient ID: Eric Glover, male    DOB: 1945/09/25  Age: 70 y.o. MRN: [id number]  CC: Back Pain   HPI Eric Glover presents for complaints of low back pain and right post. Hip pain with occassional pain into the right posteriolateral leg and calf.  No problems with lower extr. Weakness or bowel bladder dysfunction.  This began many years ago but has been intermittent since and now he has an intractable exacerbation that will not respond to conservative and typical measures.  He has been referred for eval. And treatment with  A request from his primary team to consider an epidural for low back pain and leg pain.  He has been afebrile sp a negative work up for a recent temp. Elevation.    History Eric Glover has a past medical history of Diabetes mellitus without complication ([place]); GERD (gastroesophageal reflux disease); Hyperlipidemia; Hypertension; CKD (chronic kidney disease), stage III; and Gout.   He has past surgical history that includes Shoulder arthroscopy w/ rotator cuff repair.   His family history includes Alcohol abuse in his paternal uncle; Arthritis in his mother; Cancer in his mother; Diabetes in his father and mother; Early death in his paternal uncle; Hearing loss in his father and mother; Heart disease in his mother; Hyperlipidemia in his mother; Hypertension in his mother; Kidney disease in his mother.He reports that he has never smoked. He does not have any smokeless tobacco history on file. He reports that he does not drink alcohol or use illicit drugs.   ---------------------------------------------------------------------------------------------------------------------- Past Medical History  Diagnosis Date  . Diabetes mellitus without complication ([place])   . GERD (gastroesophageal reflux disease)   . Hyperlipidemia   . Hypertension   . CKD (chronic kidney disease), stage III   . Gout     Past Surgical History  Procedure Laterality Date  . Shoulder  arthroscopy w/ rotator cuff repair      Family History  Problem Relation Age of Onset  . Alcohol abuse Paternal Uncle   . Early death Paternal Uncle   . Arthritis Mother   . Cancer Mother   . Diabetes Mother   . Hearing loss Mother   . Heart disease Mother   . Hyperlipidemia Mother   . Hypertension Mother   . Kidney disease Mother   . Diabetes Father   . Hearing loss Father     Social History  Substance Use Topics  . Smoking status: Never Smoker   . Smokeless tobacco: Not on file  . Alcohol Use: No    ---------------------------------------------------------------------------------------------------------------------- Social History   Social History  . Marital Status: Married    Spouse Name: N/A  . Number of Children: N/A  . Years of Education: N/A   Social History Main Topics  . Smoking status: Never Smoker   . Smokeless tobacco: None  . Alcohol Use: No  . Drug Use: No  . Sexual Activity: Not Asked   Other Topics Concern  . None   Social History Narrative      ----------------------------------------------------------------------------------------------------------------------  ROS Review of Systems   Objective:  BP 145/105 mmHg  Pulse 76  Temp(Src) 97.9 F (36.6 C) (Oral)  Resp 17  Ht 6\' 2"  (1.88 m)  Wt 236 lb (107.049 kg)  BMI 30.29 kg/m2  SpO2 95%  Physical Exam   PERRLA EOMI H:  RRR Low back with some paraspinous tenderness but no overt trigger points.  Pos SLR on the Right.     Assessment & Plan:   @  ASSESSPLAN@   ----------------------------------------------------------------------------------------------------------------------  Problem List Items Addressed This Visit    None    Visit Diagnoses    Acute renal failure, unspecified acute renal failure type ([place])    -  Primary    Acute back pain        Relevant Medications    ketorolac (TORADOL) 30 MG/ML injection 15 mg (Completed)    morphine 4 MG/ML injection 4 mg  (Completed)    HYDROmorphone (DILAUDID) injection 1 mg (Completed)    HYDROmorphone (DILAUDID) injection 0.5 mg    aspirin EC tablet 81 mg    predniSONE (DELTASONE) tablet 40 mg    acetaminophen (TYLENOL) tablet 650 mg    acetaminophen (TYLENOL) suppository 650 mg    oxyCODONE (Oxy IR/ROXICODONE) immediate release tablet 5 mg    cyclobenzaprine (FLEXERIL) tablet 10 mg    triamcinolone acetonide (KENALOG-40) 40 MG/ML injection (Completed)    Back pain        Relevant Medications    ketorolac (TORADOL) 30 MG/ML injection 15 mg (Completed)    morphine 4 MG/ML injection 4 mg (Completed)    HYDROmorphone (DILAUDID) injection 1 mg (Completed)    HYDROmorphone (DILAUDID) injection 0.5 mg    aspirin EC tablet 81 mg    predniSONE (DELTASONE) tablet 40 mg    acetaminophen (TYLENOL) tablet 650 mg    acetaminophen (TYLENOL) suppository 650 mg    oxyCODONE (Oxy IR/ROXICODONE) immediate release tablet 5 mg    cyclobenzaprine (FLEXERIL) tablet 10 mg    triamcinolone acetonide (KENALOG-40) 40 MG/ML injection (Completed)    Other Relevant Orders    CT Lumbar Spine Wo Contrast (Completed)    Fever of unknown origin        Relevant Orders    DG Chest 1 View (Completed)       ----------------------------------------------------------------------------------------------------------------------  @DIAGMED @   ----------------------------------------------------------------------------------------------------------------------  @ENCMEDP @   Meds ordered this encounter  Medications  . ketorolac (TORADOL) 30 MG/ML injection 15 mg    Sig:   . morphine 4 MG/ML injection 4 mg    Sig:   . morphine 4 MG/ML injection    Sig:     Eric Glover, Eric Glover: cabinet override  . sodium chloride 0.9 % bolus 1,000 mL    Sig:   . HYDROmorphone (DILAUDID) injection 1 mg    Sig:   . HYDROmorphone (DILAUDID) injection 0.5 mg    Sig:   . DISCONTD: beclomethasone (QVAR) 80 MCG/ACT inhaler 2 puff    Sig:   .  aspirin EC tablet 81 mg    Sig:   . doxazosin (CARDURA) tablet 8 mg    Sig:   . DISCONTD: insulin detemir (LEVEMIR) injection 30 Units    Sig:   . pantoprazole (PROTONIX) EC tablet 40 mg    Sig:   . DISCONTD: lisinopril (PRINIVIL,ZESTRIL) tablet 40 mg    Sig:   . atorvastatin (LIPITOR) tablet 40 mg    Sig:   . predniSONE (DELTASONE) tablet 40 mg    Sig:   . DISCONTD: heparin injection 5,000 Units    Sig:   . 0.9 %  sodium chloride infusion    Sig:   . acetaminophen (TYLENOL) tablet 650 mg    Sig:    Or  . acetaminophen (TYLENOL) suppository 650 mg    Sig:   . oxyCODONE (Oxy IR/ROXICODONE) immediate release tablet 5 mg    Sig:   . ondansetron (ZOFRAN) tablet 4 mg    Sig:    Or  . ondansetron (ZOFRAN)  injection 4 mg    Sig:   . cyclobenzaprine (FLEXERIL) tablet 10 mg    Sig:   . DISCONTD: insulin aspart (novoLOG) injection 0-9 Units    Sig:     Order Specific Question:  Correction coverage:    Answer:  Sensitive (thin, NPO, renal)    Order Specific Question:  CBG < 70:    Answer:  implement hypoglycemia protocol    Order Specific Question:  CBG 70 - 120:    Answer:  0 units    Order Specific Question:  CBG 121 - 150:    Answer:  1 unit    Order Specific Question:  CBG 151 - 200:    Answer:  2 units    Order Specific Question:  CBG 201 - 250:    Answer:  3 units    Order Specific Question:  CBG 251 - 300:    Answer:  5 units    Order Specific Question:  CBG 301 - 350:    Answer:  7 units    Order Specific Question:  CBG 351 - 400    Answer:  9 units    Order Specific Question:  CBG > 400    Answer:  call MD and obtain STAT lab verification  . ondansetron (ZOFRAN) 4 MG/2ML injection    Sig:     ZIELKE, KELLY: cabinet override  . budesonide (PULMICORT) nebulizer solution 0.25 mg    Sig:   . DISCONTD: gabapentin (NEURONTIN) capsule 300 mg    Sig:   . DISCONTD: 0.9 %  sodium chloride infusion    Sig:   . DISCONTD: insulin detemir (LEVEMIR) injection 40 Units     Sig:   . DISCONTD: insulin detemir (LEVEMIR) injection 30 Units    Sig:   . 0.9 %  sodium chloride infusion    Sig:   . insulin detemir (LEVEMIR) injection 40 Units    Sig:   . DISCONTD: insulin aspart (novoLOG) injection 0-15 Units    Sig:     Order Specific Question:  Correction coverage:    Answer:  Moderate (average weight, post-op)    Order Specific Question:  CBG < 70:    Answer:  implement hypoglycemia protocol    Order Specific Question:  CBG 70 - 120:    Answer:  0 units    Order Specific Question:  CBG 121 - 150:    Answer:  2 units    Order Specific Question:  CBG 151 - 200:    Answer:  3 units    Order Specific Question:  CBG 201 - 250:    Answer:  5 units    Order Specific Question:  CBG 251 - 300:    Answer:  8 units    Order Specific Question:  CBG 301 - 350:    Answer:  11 units    Order Specific Question:  CBG 351 - 400:    Answer:  15 units    Order Specific Question:  CBG > 400    Answer:  call MD and obtain STAT lab verification  . DISCONTD: insulin aspart (novoLOG) injection 0-5 Units    Sig:     Order Specific Question:  Correction coverage:    Answer:  HS scale    Order Specific Question:  CBG < 70:    Answer:  implement hypoglycemia protocol    Order Specific Question:  CBG 70 - 120:    Answer:  0 units    Order  Specific Question:  CBG 121 - 150:    Answer:  0 units    Order Specific Question:  CBG 151 - 200:    Answer:  0 units    Order Specific Question:  CBG 201 - 250:    Answer:  2 units    Order Specific Question:  CBG 251 - 300:    Answer:  3 units    Order Specific Question:  CBG 301 - 350:    Answer:  4 units    Order Specific Question:  CBG 351 - 400:    Answer:  5 units    Order Specific Question:  CBG > 400    Answer:  call MD and obtain STAT lab verification  . insulin aspart (novoLOG) injection 4 Units    Sig:   . insulin aspart (novoLOG) injection 0-15 Units    Sig:     Order Specific Question:  Correction coverage:     Answer:  Moderate (average weight, post-op)    Order Specific Question:  CBG < 70:    Answer:  implement hypoglycemia protocol    Order Specific Question:  CBG 70 - 120:    Answer:  0 units    Order Specific Question:  CBG 121 - 150:    Answer:  2 units    Order Specific Question:  CBG 151 - 200:    Answer:  3 units    Order Specific Question:  CBG 201 - 250:    Answer:  5 units    Order Specific Question:  CBG 251 - 300:    Answer:  8 units    Order Specific Question:  CBG 301 - 350:    Answer:  11 units    Order Specific Question:  CBG 351 - 400:    Answer:  15 units    Order Specific Question:  CBG > 400    Answer:  call MD and obtain STAT lab verification  . insulin aspart (novoLOG) injection 10 Units    Sig:   . levofloxacin (LEVAQUIN) IVPB 750 mg    Sig:     Order Specific Question:  Antibiotic Indication:    Answer:  Other Indication (list below)    Order Specific Question:  Other Indication:    Answer:  Fever of unknown origin.  Pre-procedure.  Pt. to get Epidural injection today.  . gabapentin (NEURONTIN) capsule 300 mg    Sig:   . ropivacaine (PF) 2 mg/ml (0.2%) (NAROPIN) 2 MG/ML epidural    Sig:     Willeen Cass: cabinet override  . iohexol (OMNIPAQUE) 180 MG/ML injection    Sig:     Willeen Cass: cabinet override  . lidocaine (PF) (XYLOCAINE) 1 % injection    Sig:     Willeen Cass: cabinet override  . sodium chloride 0.9 % injection    Sig:     Willeen Cass: cabinet override  . triamcinolone acetonide (KENALOG-40) 40 MG/ML injection    Sig:     Willeen Cass: cabinet override  . midazolam (VERSED) 5 MG/5ML injection    Sig:     Willeen Cass: cabinet override       Follow-up: No Follow-up on file.    Molli Barrows, MD

## 2015-05-10 ENCOUNTER — Inpatient Hospital Stay: Payer: Non-veteran care

## 2015-05-10 ENCOUNTER — Telehealth: Payer: Self-pay | Admitting: *Deleted

## 2015-05-10 ENCOUNTER — Inpatient Hospital Stay
Admit: 2015-05-10 | Discharge: 2015-05-10 | Disposition: A | Discharge status: Home / Self Care | Payer: Non-veteran care | Attending: Infectious Diseases | Admitting: Infectious Diseases

## 2015-05-10 LAB — BASIC METABOLIC PANEL
ANION GAP: 6 (ref 5–15)
BUN: 43 mg/dL — AB (ref 6–20)
CHLORIDE: 110 mmol/L (ref 101–111)
CO2: 19 mmol/L — AB (ref 22–32)
Calcium: 8.1 mg/dL — ABNORMAL LOW (ref 8.9–10.3)
Creatinine, Ser: 1.45 mg/dL — ABNORMAL HIGH (ref 0.61–1.24)
GFR calc Af Amer: 55 mL/min — ABNORMAL LOW (ref >60)
GFR calc non Af Amer: 48 mL/min — ABNORMAL LOW (ref >60)
GLUCOSE: 271 mg/dL — AB (ref 65–99)
POTASSIUM: 4.7 mmol/L (ref 3.5–5.1)
Sodium: 135 mmol/L (ref 135–145)

## 2015-05-10 LAB — GLUCOSE, CAPILLARY
GLUCOSE-CAPILLARY: 260 mg/dL — AB (ref 65–99)
GLUCOSE-CAPILLARY: 320 mg/dL — AB (ref 65–99)
Glucose-Capillary: 258 mg/dL — ABNORMAL HIGH (ref 65–99)
Glucose-Capillary: 289 mg/dL — ABNORMAL HIGH (ref 65–99)

## 2015-05-10 LAB — CBC
HCT: 32.8 % — ABNORMAL LOW (ref 40.0–52.0)
Hemoglobin: 10.9 g/dL — ABNORMAL LOW (ref 13.0–18.0)
MCH: 27.2 pg (ref 26.0–34.0)
MCHC: 33.3 g/dL (ref 32.0–36.0)
MCV: 81.7 fL (ref 80.0–100.0)
Platelets: 260 10*3/uL (ref 150–440)
RBC: 4.02 MIL/uL — AB (ref 4.40–5.90)
RDW: 15.3 % — ABNORMAL HIGH (ref 11.5–14.5)
WBC: 9.6 10*3/uL (ref 3.8–10.6)

## 2015-05-10 LAB — C-REACTIVE PROTEIN: CRP: 13.9 mg/dL — ABNORMAL HIGH (ref <1.0)

## 2015-05-10 MED ORDER — GABAPENTIN 100 MG PO CAPS
100.0000 mg | ORAL_CAPSULE | Freq: Every day | ORAL | Status: DC
  Administered 2015-05-10 – 2015-05-12 (×3): 100 mg via ORAL
  Filled 2015-05-10 (×3): qty 1

## 2015-05-10 MED ORDER — PREDNISONE 20 MG PO TABS
20.0000 mg | ORAL_TABLET | Freq: Every day | ORAL | Status: AC
  Administered 2015-05-12: 20 mg via ORAL
  Filled 2015-05-10: qty 1

## 2015-05-10 MED ORDER — PREDNISONE 20 MG PO TABS
30.0000 mg | ORAL_TABLET | Freq: Every day | ORAL | Status: AC
  Administered 2015-05-11: 30 mg via ORAL
  Filled 2015-05-10: qty 1

## 2015-05-10 MED ORDER — PREDNISONE 10 MG PO TABS
10.0000 mg | ORAL_TABLET | Freq: Every day | ORAL | Status: AC
  Administered 2015-05-13: 10 mg via ORAL
  Filled 2015-05-10: qty 1

## 2015-05-10 MED ORDER — FENTANYL 12 MCG/HR TD PT72
25.0000 ug | MEDICATED_PATCH | TRANSDERMAL | Status: DC
  Administered 2015-05-10: 25 ug via TRANSDERMAL
  Filled 2015-05-10: qty 1

## 2015-05-10 MED ORDER — DOCUSATE SODIUM 100 MG PO CAPS
100.0000 mg | ORAL_CAPSULE | Freq: Two times a day (BID) | ORAL | Status: DC
  Administered 2015-05-11 – 2015-05-17 (×13): 100 mg via ORAL
  Filled 2015-05-10 (×14): qty 1

## 2015-05-10 MED ORDER — POLYETHYLENE GLYCOL 3350 17 G PO PACK
17.0000 g | PACK | Freq: Every day | ORAL | Status: DC | PRN
  Administered 2015-05-16: 17 g via ORAL
  Filled 2015-05-10 (×2): qty 1

## 2015-05-10 NOTE — Progress Notes (Signed)
[facility] NAME: Eric Glover    MR#:  [id number]  DATE OF BIRTH:  Aug 11, 1945  SUBJECTIVE:   Patient here due to intractable back pain also noted to be in acute on chronic renal failure. Patient had an epidural injection yesterday but says his back pain is not much improved. He was noted to be bacteremic but fever has resolved.    REVIEW OF SYSTEMS:    Review of Systems  Constitutional: Negative for fever and chills.  HENT: Negative for congestion and tinnitus.   Eyes: Negative for blurred vision and double vision.  Respiratory: Negative for cough, shortness of breath and wheezing.   Cardiovascular: Negative for chest pain, orthopnea and PND.  Gastrointestinal: Negative for nausea, vomiting, abdominal pain and diarrhea.  Genitourinary: Negative for dysuria and hematuria.  Musculoskeletal: Positive for back pain.  Neurological: Positive for weakness (Generalized. ). Negative for dizziness, sensory change and focal weakness.  All other systems reviewed and are negative.   Nutrition: Heart healthy/Carb control Tolerating Diet: yes Tolerating PT:  Eval noted.   DRUG ALLERGIES:   Allergies  Allergen Reactions  . Neomycin-Bacitracin Zn-Polymyx Rash    VITALS:  Blood pressure 106/69, pulse 76, temperature 98.2 F (36.8 C), temperature source Oral, resp. rate 17, height 6\' 2"  (1.88 m), weight 107.049 kg (236 lb), SpO2 95 %.  PHYSICAL EXAMINATION:   Physical Exam  GENERAL:  70 y.o.-year-old patient lying in the bed in no acute distress.  EYES: Pupils equal, round, reactive to light and accommodation. No scleral icterus. Extraocular muscles intact.  HEENT: Head atraumatic, normocephalic. Oropharynx and nasopharynx clear. Moist oral mucosa NECK:  Supple, no jugular venous distention. No thyroid enlargement, no tenderness.  LUNGS: Normal breath sounds bilaterally, no wheezing, rales, rhonchi. No use of accessory  muscles of respiration.  CARDIOVASCULAR: S1, S2 normal. No murmurs, rubs, or gallops.  ABDOMEN: Soft, nontender, nondistended. Bowel sounds present. No organomegaly or mass.  EXTREMITIES: No cyanosis, clubbing or edema b/l.    NEUROLOGIC: Cranial nerves II through XII are intact. No focal Motor or sensory deficits b/l. Globally weak due to back pain. Myoclonic Jerks improving.  PSYCHIATRIC: The patient is alert and oriented x 3.  SKIN: No obvious rash, lesion, or ulcer.    LABORATORY PANEL:   CBC  Recent Labs Lab 05/10/15 0503  WBC 9.6  HGB 10.9*  HCT 32.8*  PLT 260   ------------------------------------------------------------------------------------------------------------------  Chemistries   Recent Labs Lab 05/06/15 1540  05/10/15 0503  NA 131*  < > 135  K 5.5*  < > 4.7  CL 105  < > 110  CO2 18*  < > 19*  GLUCOSE 331*  < > 271*  BUN 57*  < > 43*  CREATININE 2.16*  < > 1.45*  CALCIUM 8.7*  < > 8.1*  AST 28  --   --   ALT 22  --   --   ALKPHOS 67  --   --   BILITOT 1.1  --   --   < > = values in this interval not displayed. ------------------------------------------------------------------------------------------------------------------  Cardiac Enzymes No results for input(s): TROPONINI in the last 168 hours. ------------------------------------------------------------------------------------------------------------------  RADIOLOGY:  Mr Lumbar Spine Wo Contrast  05/10/2015  CLINICAL DATA:  Chronic low back pain which began to worsen approximately 3 days ago. Difficulty standing. Pain is eccentric to the right side of the low back and radiates into the right leg with tingling in the anterior  aspect of the right thigh. Initial encounter. EXAM: MRI LUMBAR SPINE WITHOUT CONTRAST TECHNIQUE: Multiplanar, multisequence MR imaging of the lumbar spine was performed. No intravenous contrast was administered. COMPARISON:  CT lumbar spine 05/07/2015. FINDINGS: Rudimentary  disc material is seen at the S1-2 level. This is the lowest level imaged the axial plane. There is no fracture. Scattered Schmorl's nodes are noted. Facet degenerative disease results in 0.7 cm anterolisthesis L5 on S1. There is also convex right scoliosis. Scattered degenerative endplate signal change is seen. Small hemangioma is noted in T12. There is no worrisome marrow lesion. The conus medullaris is normal in signal and position. T12-L1:  Negative. L1-2: Shallow disc bulge more prominent to the left. There is mild narrowing in the left lateral recess without nerve root compression. The foramina are open. L2-3: Shallow disc bulge and ligamentum flavum thickening cause mild central canal narrowing. The foramina are open. No nerve root compression. L3-4: There is a disc bulge with ligamentum flavum thickening and facet degenerative change. Moderate central canal stenosis is identified. Mild left foraminal narrowing is seen. The right foramen is open. L4-5: Shallow disc bulge, ligamentum flavum thickening and facet arthropathy cause moderate central canal narrowing. Mild bilateral foraminal narrowing is also seen. L5-S1: The disc is uncovered with a shallow bulge. There is bilateral facet degenerative disease. The central canal is mildly narrowed. Mild left and moderately severe to severe right foraminal narrowing is present. S1-2: Rudimentary disc material. The central canal and foramina are open. 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 Moderately severe to severe right foraminal narrowing is present this level. There is mild left foraminal narrowing. The central canal is open. Moderate central canal narrowing L3-4 and L4-5 due to shallow disc bulge ligamentum flavum thickening. Electronically Signed   By: Inge Rise M.D.   On: 05/10/2015 13:56     ASSESSMENT AND PLAN:   71 year old male with past medical history of  diabetes type 2 without complication, GERD, hypertension, hyperlipidemia, chronic kidney disease stage III, gout who presents to the hospital with back pain and noted to be in acute on chronic renal failure.  #1 intractable back pain-patient's x-ray on admission showed some mild DJD.  - CT scan of the lumbar spine showed significant disc bulge at L5-S1 level along with central canal stenosis and some neural foramen narrowing and nerve impingement. -Status post epidural injection done by anesthesia yesterday. Pain does not seem to be much improved. -Continue supportive care with pain medications and I will start Fentanyl patch, continue as needed Dilaudid, Oxycodone and monitor.   -Patient was having fevers as was noted to be bacteremic and underwent an MRI today to rule out discitis which is negative. -Continue PT as tolerated but patient refuses skilled nursing facility.  #2 sepsis/bacteremia-patient spiked a fever of 103 two days ago and yesterday a low grade fever.  -His blood cultures positive for Streptococcus but not identified yet. -MRI of the lumbar spine negative for any acute discitis. Appreciate infectious disease input and we'll get echocardiogram to rule out endocarditis. Repeat blood cultures have been ordered to make sure they're clearing. -Continue ceftriaxone for now.  # 3 acute on chronic renal failure-Likely due to dehydration.  -Improved and back to baseline with IV fluids.  #4 Hyperkalemia - mild and due to acute on chronic renal failure.  - resolved now.   #5 DM Type II w/out complication - BS a bit elevated due to steroids.  -  I will taper the patient's steroids. - cont. Levemir, Novolog with meals and cont.  SSI  #6 BPH - cont. Doxazosin.    #7 Hyperlipidemia - cont. Hyperlipidemia.   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  - will cont. To taper Neurontin further.   Pt. Belongs to the [place] and paperwork has  been faxed over by CM. They are on Diversion presently.   All the records are reviewed and case discussed with Care Management/Social Workerr. Management plans discussed with the patient, family and they are in agreement.  CODE STATUS: Full  DVT Prophylaxis: TED's and SCD's.   TOTAL TIME TAKING CARE OF THIS PATIENT: 30 minutes.   POSSIBLE D/C IN 2-3 DAYS, DEPENDING ON CLINICAL CONDITION.   Henreitta Leber M.D on 05/10/2015 at 3:43 PM  Between 7am to 6pm - Pager - [phone]  After 6pm go to [email or web address] - password [email or web address] [facility]  Office  [phone]  CC: Primary care physician; Ashok Norris, MD

## 2015-05-10 NOTE — Progress Notes (Signed)
Physical Therapy Treatment Patient Details Name: Eric Glover MRN: [id number] DOB: 04-15-1945 Today's Date: 05/10/2015    History of Present Illness presented to ER and admitted under observation for acute exacerbation of LBP (with radicular symptoms); also noted in acute/chronic renal failure. MRI positive for possible diskitis? Pt is now s/p epidural for pain control.     PT Comments    Limited treatment secondary to lunch trays delivered. Pt agreeable to ambulation and is able to progress in distance without severe pain. Pt does need heavy use of UE on rw to ambulate safely. No buckling noted in B LE, however + 2 used for safety. Pt motivated to perform therapy. Pt continues to be limited by pain, limiting functional mobility. Pt still with noted weakness present in B LE, however sensation intact this date.   Follow Up Recommendations  SNF     Equipment Recommendations       Recommendations for Other Services       Precautions / Restrictions Precautions Precautions: Fall Restrictions Weight Bearing Restrictions: No    Mobility  Bed Mobility Overal bed mobility: Needs Assistance Bed Mobility: Supine to Sit;Sit to Supine     Supine to sit: Supervision Sit to supine: Supervision   General bed mobility comments: safe technique performed. Once seated at EOB, pt able to sit safely  Transfers Overall transfer level: Needs assistance Equipment used: Rolling walker (2 wheeled) Transfers: Sit to/from Stand Sit to Stand: Min assist         General transfer comment: transfers performed with rw and safe technique including correct hand placement. Once standing, heavy use of B UE on rw as B LE weaker  Ambulation/Gait Ambulation/Gait assistance: Min guard;+2 safety/equipment Ambulation Distance (Feet): 60 Feet Assistive device: Rolling walker (2 wheeled) Gait Pattern/deviations: Step-to pattern     General Gait Details: Ambulated with step to gait pattern and heavy  use of B UE bearing down on rw. Pt reports pain in low back with movement, however no buckling noted. +2 assist for safety.   Stairs            Wheelchair Mobility    Modified Rankin (Stroke Patients Only)       Balance                                    Cognition Arousal/Alertness: Awake/alert Behavior During Therapy: WFL for tasks assessed/performed Overall Cognitive Status: Within Functional Limits for tasks assessed                      Exercises      General Comments        Pertinent Vitals/Pain Pain Assessment: 0-10 Pain Score: 8  Pain Location: R hip and back Pain Descriptors / Indicators: Contraction Pain Intervention(s): Limited activity within patient's tolerance    Home Living                      Prior Function            PT Goals (current goals can now be found in the care plan section) Acute Rehab PT Goals Patient Stated Goal: to make the pain better PT Goal Formulation: With patient/family Time For Goal Achievement: 05/21/15 Potential to Achieve Goals: Good Progress towards PT goals: Progressing toward goals    Frequency  Min 2X/week    PT Plan Current plan remains appropriate  Co-evaluation             End of Session Equipment Utilized During Treatment: Gait belt Activity Tolerance: Patient limited by pain Patient left: in bed;with call bell/phone within reach;with bed alarm set;with family/visitor present     Time: [id number] PT Time Calculation (min) (ACUTE ONLY): 14 min  Charges:  $Gait Training: 8-22 mins                    G Codes:      Hayat Warbington 06-07-15, 4:02 PM  Greggory Stallion, PT, DPT [phone]

## 2015-05-10 NOTE — Telephone Encounter (Signed)
Spoke with patients nurse, Barnett Applebaum, she states that he is doing well and did not c/o pain until after ambulating with PT this a.m. At which time he took his pain medication prescribed.  Barnett Applebaum will let the patient know that we called as he is down for MRI currently.

## 2015-05-10 NOTE — Consult Note (Signed)
[facility] Infectious Disease     Reason for Consult:Back pain, bacteremia    Referring Physician: Jeronimo Greaves Date of Admission:  05/06/2015   Principal Problem:   Acute on chronic kidney failure ([place]) Active Problems:   HLD (hyperlipidemia)   Type 2 diabetes mellitus (HCC)   HTN (hypertension)   Low back pain   Acute on chronic renal failure (HCC)   Fever   DDD (degenerative disc disease), lumbar   Sciatica associated with disorder of lumbar spine   HPI: Eric Glover is a 70 y.o. male with DM, GERD< HLD< HTN and CKD admitted with worsening back pain. ON admit was AF but wbc 11.1.  He then developed high fevers to 103 and bcx + strep species.  Underwent CT back showing severe DJD and had epidural lumbar injection 2/15 (prior to bcx results coming back).  Prior to admission denies fevers, chills, sweats, skin lesions, recent procedures although had dental work done 2 weeks ago (routine with no infection or major procedures done).   Past Medical History  Diagnosis Date  . Diabetes mellitus without complication ([place])   . GERD (gastroesophageal reflux disease)   . Hyperlipidemia   . Hypertension   . CKD (chronic kidney disease), stage III   . Gout    Past Surgical History  Procedure Laterality Date  . Shoulder arthroscopy w/ rotator cuff repair     Social History  Substance Use Topics  . Smoking status: Never Smoker   . Smokeless tobacco: None  . Alcohol Use: No   Family History  Problem Relation Age of Onset  . Alcohol abuse Paternal Uncle   . Early death Paternal Uncle   . Arthritis Mother   . Cancer Mother   . Diabetes Mother   . Hearing loss Mother   . Heart disease Mother   . Hyperlipidemia Mother   . Hypertension Mother   . Kidney disease Mother   . Diabetes Father   . Hearing loss Father     Allergies:  Allergies  Allergen Reactions  . Neomycin-Bacitracin Zn-Polymyx Rash    Current antibiotics: Antibiotics Given (last 72 hours)     Date/Time Action Medication Dose Rate   05/09/15 1133 Given  [Recieved from Pharmacy]   levofloxacin (LEVAQUIN) IVPB 750 mg 750 mg 100 mL/hr   05/09/15 2229 Given   cefTRIAXone (ROCEPHIN) 2 g in dextrose 5 % 50 mL IVPB 2 g 100 mL/hr      MEDICATIONS: . aspirin EC  81 mg Oral Daily  . atorvastatin  40 mg Oral q1800  . budesonide (PULMICORT) nebulizer solution  0.25 mg Nebulization BID  . cefTRIAXone (ROCEPHIN)  IV  2 g Intravenous Q24H  . doxazosin  8 mg Oral Daily  . gabapentin  300 mg Oral QHS  . insulin aspart  0-15 Units Subcutaneous TID AC & HS  . insulin aspart  4 Units Subcutaneous TID WC  . insulin detemir  40 Units Subcutaneous QHS  . pantoprazole  40 mg Oral Daily  . predniSONE  40 mg Oral Q breakfast    Review of Systems - 11 systems reviewed and negative per HPI   OBJECTIVE: Temp:  [97.3 F (36.3 C)-98.2 F (36.8 C)] 97.3 F (36.3 C) (02/15 0445) Pulse Rate:  [60-100] 100 (02/15 0445) Resp:  [15-22] 22 (02/15 0445) BP: (134-165)/(66-105) 134/90 mmHg (02/15 0445) SpO2:  [93 %-98 %] 94 % (02/15 0836) Weight:  [107.049 kg (236 lb)] 107.049 kg (236 lb) (02/14 1259) Physical Exam  Constitutional:  He is oriented to person, place, and time. He appears well-developed and well-nourished. No distress. IN pain from back  HENT: anictoric, perrla Mouth/Throat: Oropharynx is clear and moist. No oropharyngeal exudate.  Cardiovascular: Normal rate, regular rhythm and normal heart sounds.2/6 smPulmonary/Chest: Effort normal and breath sounds normal. No respiratory distress. He has no wheezes.  Abdominal: Soft. Bowel sounds are normal. He exhibits no distension. There is no tenderness.  Lymphadenopathy: He has no cervical adenopathy.  Neurological: He is alert and oriented to person, place, and time.  Skin: Skin is warm and dry. No rash noted. No erythema.  Psychiatric: He has a normal mood and affect. His behavior is normal.  BAck pain     LABS: Results for orders placed  or performed during the hospital encounter of 05/06/15 (from the past 48 hour(s))  Glucose, capillary     Status: Abnormal   Collection Time: 05/08/15 11:29 AM  Result Value Ref Range   Glucose-Capillary 220 (H) 65 - 99 mg/dL  Glucose, capillary     Status: Abnormal   Collection Time: 05/08/15  4:25 PM  Result Value Ref Range   Glucose-Capillary 311 (H) 65 - 99 mg/dL  Urinalysis complete, with microscopic ([facility] only)     Status: Abnormal   Collection Time: 05/08/15  6:00 PM  Result Value Ref Range   Color, Urine YELLOW (A) YELLOW   APPearance HAZY (A) CLEAR   Glucose, UA >500 (A) NEGATIVE mg/dL   Bilirubin Urine NEGATIVE NEGATIVE   Ketones, ur TRACE (A) NEGATIVE mg/dL   Specific Gravity, Urine 1.015 1.005 - 1.030   Hgb urine dipstick 2+ (A) NEGATIVE   pH 5.0 5.0 - 8.0   Protein, ur NEGATIVE NEGATIVE mg/dL   Nitrite NEGATIVE NEGATIVE   Leukocytes, UA TRACE (A) NEGATIVE   RBC / HPF 0-5 0 - 5 RBC/hpf   WBC, UA 0-5 0 - 5 WBC/hpf   Bacteria, UA NONE SEEN NONE SEEN   Squamous Epithelial / LPF 0-5 (A) NONE SEEN  Glucose, capillary     Status: Abnormal   Collection Time: 05/08/15  9:32 PM  Result Value Ref Range   Glucose-Capillary 442 (H) 65 - 99 mg/dL  Glucose, capillary     Status: Abnormal   Collection Time: 05/08/15  9:46 PM  Result Value Ref Range   Glucose-Capillary 437 (H) 65 - 99 mg/dL  Glucose, capillary     Status: Abnormal   Collection Time: 05/09/15 12:15 AM  Result Value Ref Range   Glucose-Capillary 447 (H) 65 - 99 mg/dL   Comment 1 Notify RN   Glucose, capillary     Status: Abnormal   Collection Time: 05/09/15  3:41 AM  Result Value Ref Range   Glucose-Capillary 193 (H) 65 - 99 mg/dL  CBC     Status: Abnormal   Collection Time: 05/09/15  5:45 AM  Result Value Ref Range   WBC 8.8 3.8 - 10.6 K/uL   RBC 3.91 (L) 4.40 - 5.90 MIL/uL   Hemoglobin 10.7 (L) 13.0 - 18.0 g/dL   HCT 32.0 (L) 40.0 - 52.0 %   MCV 81.9 80.0 - 100.0 fL   MCH 27.4 26.0 - 34.0 pg   MCHC  33.4 32.0 - 36.0 g/dL   RDW 15.0 (H) 11.5 - 14.5 %   Platelets 277 150 - 440 K/uL  Basic metabolic panel     Status: Abnormal   Collection Time: 05/09/15  5:45 AM  Result Value Ref Range   Sodium 131 (L) 135 -  145 mmol/L   Potassium 4.2 3.5 - 5.1 mmol/L   Chloride 105 101 - 111 mmol/L   CO2 18 (L) 22 - 32 mmol/L   Glucose, Bld 159 (H) 65 - 99 mg/dL   BUN 47 (H) 6 - 20 mg/dL   Creatinine, Ser 1.71 (H) 0.61 - 1.24 mg/dL   Calcium 8.0 (L) 8.9 - 10.3 mg/dL   GFR calc non Af Amer 39 (L) >60 mL/min   GFR calc Af Amer 45 (L) >60 mL/min    Comment: (NOTE) The eGFR has been calculated using the CKD EPI equation. This calculation has not been validated in all clinical situations. eGFR's persistently <60 mL/min signify possible Chronic Kidney Disease.    Anion gap 8 5 - 15  Glucose, capillary     Status: Abnormal   Collection Time: 05/09/15  6:59 AM  Result Value Ref Range   Glucose-Capillary 211 (H) 65 - 99 mg/dL  Glucose, capillary     Status: Abnormal   Collection Time: 05/09/15  7:30 AM  Result Value Ref Range   Glucose-Capillary 147 (H) 65 - 99 mg/dL   Comment 1 Notify RN   Glucose, capillary     Status: Abnormal   Collection Time: 05/09/15 11:24 AM  Result Value Ref Range   Glucose-Capillary 144 (H) 65 - 99 mg/dL   Comment 1 Notify RN   Glucose, capillary     Status: Abnormal   Collection Time: 05/09/15  4:35 PM  Result Value Ref Range   Glucose-Capillary 201 (H) 65 - 99 mg/dL   Comment 1 Notify RN   Glucose, capillary     Status: Abnormal   Collection Time: 05/09/15  9:58 PM  Result Value Ref Range   Glucose-Capillary 375 (H) 65 - 99 mg/dL  CBC     Status: Abnormal   Collection Time: 05/10/15  5:03 AM  Result Value Ref Range   WBC 9.6 3.8 - 10.6 K/uL   RBC 4.02 (L) 4.40 - 5.90 MIL/uL   Hemoglobin 10.9 (L) 13.0 - 18.0 g/dL   HCT 32.8 (L) 40.0 - 52.0 %   MCV 81.7 80.0 - 100.0 fL   MCH 27.2 26.0 - 34.0 pg   MCHC 33.3 32.0 - 36.0 g/dL   RDW 15.3 (H) 11.5 - 14.5 %    Platelets 260 150 - 440 K/uL  Basic metabolic panel     Status: Abnormal   Collection Time: 05/10/15  5:03 AM  Result Value Ref Range   Sodium 135 135 - 145 mmol/L   Potassium 4.7 3.5 - 5.1 mmol/L   Chloride 110 101 - 111 mmol/L   CO2 19 (L) 22 - 32 mmol/L   Glucose, Bld 271 (H) 65 - 99 mg/dL   BUN 43 (H) 6 - 20 mg/dL   Creatinine, Ser 1.45 (H) 0.61 - 1.24 mg/dL   Calcium 8.1 (L) 8.9 - 10.3 mg/dL   GFR calc non Af Amer 48 (L) >60 mL/min   GFR calc Af Amer 55 (L) >60 mL/min    Comment: (NOTE) The eGFR has been calculated using the CKD EPI equation. This calculation has not been validated in all clinical situations. eGFR's persistently <60 mL/min signify possible Chronic Kidney Disease.    Anion gap 6 5 - 15  Glucose, capillary     Status: Abnormal   Collection Time: 05/10/15  7:33 AM  Result Value Ref Range   Glucose-Capillary 258 (H) 65 - 99 mg/dL   Comment 1 Notify RN  No components found for: ESR, C REACTIVE PROTEIN MICRO: Recent Results (from the past 720 hour(s))  CULTURE, BLOOD (ROUTINE X 2) w Reflex to PCR ID Panel     Status: None (Preliminary result)   Collection Time: 05/08/15  9:34 AM  Result Value Ref Range Status   Specimen Description BLOOD LEFT ARM  Final   Special Requests   Final    BOTTLES DRAWN AEROBIC AND ANAEROBIC 7ML ARE 5ML ANA   Culture  Setup Time   Final    GRAM POSITIVE COCCI ANAEROBIC BOTTLE ONLY CRITICAL RESULT CALLED TO, READ BACK BY AND VERIFIED WITH: JASON ROBBINS AT 1949 ON 2/14 17 MLZ    Culture   Final    GRAM POSITIVE COCCI IN BOTH AEROBIC AND ANAEROBIC BOTTLES IDENTIFICATION TO FOLLOW    Report Status PENDING  Incomplete  CULTURE, BLOOD (ROUTINE X 2) w Reflex to PCR ID Panel     Status: None (Preliminary result)   Collection Time: 05/08/15  9:34 AM  Result Value Ref Range Status   Specimen Description BLOOD RT ARM  Final   Special Requests   Final    BOTTLES DRAWN AEROBIC AND ANAEROBIC 6ML ANA 9ML AER   Culture  Setup Time    Final    GRAM POSITIVE COCCI ANAEROBIC BOTTLE ONLY CRITICAL RESULT CALLED TO, READ BACK BY AND VERIFIED WITH: JASON ROBBINS AT 1949 ON 05/09/15 MLZ    Culture   Final    STREPTOCOCCUS SPECIES ANAEROBIC BOTTLE ONLY IDENTIFICATION TO FOLLOW    Report Status PENDING  Incomplete  Blood Culture ID Panel (Reflexed)     Status: Abnormal   Collection Time: 05/08/15  9:34 AM  Result Value Ref Range Status   Enterococcus species NOT DETECTED NOT DETECTED Final   Listeria monocytogenes NOT DETECTED NOT DETECTED Final   Staphylococcus species NOT DETECTED NOT DETECTED Final   Staphylococcus aureus NOT DETECTED NOT DETECTED Final   Streptococcus species DETECTED (A) NOT DETECTED Final    Comment: CRITICAL RESULT CALLED TO, READ BACK BY AND VERIFIED WITH: CALLED TO JASON ROBBINS AT 1949 ON 2/14 17 MLZ    Streptococcus agalactiae NOT DETECTED NOT DETECTED Final   Streptococcus pneumoniae NOT DETECTED NOT DETECTED Final   Streptococcus pyogenes NOT DETECTED NOT DETECTED Final   Acinetobacter baumannii NOT DETECTED NOT DETECTED Final   Enterobacteriaceae species NOT DETECTED NOT DETECTED Final   Enterobacter cloacae complex NOT DETECTED NOT DETECTED Final   Escherichia coli NOT DETECTED NOT DETECTED Final   Klebsiella oxytoca NOT DETECTED NOT DETECTED Final   Klebsiella pneumoniae NOT DETECTED NOT DETECTED Final   Proteus species NOT DETECTED NOT DETECTED Final   Serratia marcescens NOT DETECTED NOT DETECTED Final   Haemophilus influenzae NOT DETECTED NOT DETECTED Final   Neisseria meningitidis NOT DETECTED NOT DETECTED Final   Pseudomonas aeruginosa NOT DETECTED NOT DETECTED Final   Candida albicans NOT DETECTED NOT DETECTED Final   Candida glabrata NOT DETECTED NOT DETECTED Final   Candida krusei NOT DETECTED NOT DETECTED Final   Candida parapsilosis NOT DETECTED NOT DETECTED Final   Candida tropicalis NOT DETECTED NOT DETECTED Final   Carbapenem resistance NOT DETECTED NOT DETECTED Final    Methicillin resistance NOT DETECTED NOT DETECTED Final   Vancomycin resistance NOT DETECTED NOT DETECTED Final    IMAGING: Dg Chest 1 View  05/08/2015  CLINICAL DATA:  Fever of unknown origin EXAM: CHEST 1 VIEW COMPARISON:  02/07/2015 FINDINGS: Cardiac shadow is within normal limits. Lungs are well aerated  bilaterally. Some minimal platelike atelectasis is noted in the left base. No focal confluent infiltrate is noted. No bony abnormality is seen. IMPRESSION: No acute abnormality noted. Electronically Signed   By: Inez Catalina M.D.   On: 05/08/2015 09:56   Dg Lumbar Spine Complete  05/06/2015  CLINICAL DATA:  70 year old presenting with low back pain, muscle spasms and radicular pain into the right lower extremity which began yesterday. EXAM: LUMBAR SPINE - COMPLETE 4+ VIEW COMPARISON:  None. FINDINGS: Five non rib-bearing lumbar vertebrae with anatomic posterior alignment. No fractures. Mild thoracolumbar scoliosis convex right. Disc space narrowing and endplate hypertrophic changes at every lumbar level, worst at L1-2 and L5-S1. Bridging osteophytes at multiple lower thoracic and lumbar levels. No pars defects. Facet degenerative changes at L4-5 and L5-S1, left greater than right. Sacroiliac joints intact. Aortoiliac atherosclerosis without aneurysm. IMPRESSION: 1. No acute osseous abnormality. 2. Multilevel degenerative disc disease, spondylosis and facet degenerative changes as detailed above. Electronically Signed   By: Evangeline Dakin M.D.   On: 05/06/2015 21:09   Ct Lumbar Spine Wo Contrast  05/07/2015  CLINICAL DATA:  Acute worsening low back pain. Chronic back pain, worsening over the last 3 days. Difficulty standing today. Low right-sided back pain which sometimes radiates down leg. EXAM: CT LUMBAR SPINE WITHOUT CONTRAST TECHNIQUE: Multidetector CT imaging of the lumbar spine was performed without intravenous contrast administration. Multiplanar CT image reconstructions were also generated.  COMPARISON:  Plain film of the lumbar spine dated 05/06/2015. FINDINGS: Dextroscoliosis of the lumbar spine, centered at the at L2-3 level. No acute- appearing osseous abnormality. Degenerative changes are seen throughout the lumbar spine. Disc bulges are seen at every level, most severe at the L5-S1 level causing moderate central canal stenosis and probable right-sided nerve root impingement. Additional disc bulges at the L1-2 thru L4-5 levels causing mild to moderate central canal stenoses and mild to moderate neural foramen narrowings without obvious nerve root impingement. Atherosclerotic changes noted along the walls of the normal- caliber abdominal aorta. Visualized paravertebral soft tissues are otherwise unremarkable. IMPRESSION: 1. Multilevel degenerative disc disease, most severe at the L5-S1 level where a disc bulge is causing moderate central canal stenosis and moderate-to-severe right neural foramen narrowing with probable associated nerve root impingement. This is the likely cause of patient's symptoms. 2. Dextroscoliosis of the lumbar spine. 3. No acute findings. Electronically Signed   By: Franki Cabot M.D.   On: 05/07/2015 11:24    Assessment:   Eric Glover is a 70 y.o. male admitted with LBP and found to have fevers, strep bacteremia MRI neg for osteomyelitis. Had recent dental work.  Recommendations Repeat bcx - ordered Check ECHO - ordered-  may need TEE if bcx is viridans strep Continue ceftriaxone. Discussed with pt and Dr Gladstone Lighter.   Thank you very much for allowing me to participate in the care of this patient. Please call with questions.   Cheral Marker. Ola Spurr, MD

## 2015-05-10 NOTE — Plan of Care (Signed)
Problem: Pain Managment: Goal: General experience of comfort will improve Outcome: Progressing Pt has received fentanyl pain patch to his back.

## 2015-05-10 NOTE — Care Management (Signed)
Spoke with Fredderick Phenix transfer coordinator at [facility]. [facility].  Requested updated progress note from today.  When available will fax to [phone].  Notified Broadus John of need for home health services at time of discharge.  I was instructed to provide patient with choice of agency. At time of discharge I would fax the order to [facility] [place] and [place] MD would authorize.

## 2015-05-10 NOTE — Progress Notes (Signed)
Inpatient Diabetes Program Recommendations  AACE/ADA: New Consensus Statement on Inpatient Glycemic Control (2015)  Target Ranges:  Prepandial:   less than 140 mg/dL      Peak postprandial:   less than 180 mg/dL (1-2 hours)      Critically ill patients:  140 - 180 mg/dL   Review of Glycemic ControlResults for Eric Glover, Eric Glover (MRN [id number]) as of 05/10/2015 11:06  Ref. Range 05/09/2015 07:30 05/09/2015 11:24 05/09/2015 16:35 05/09/2015 21:58 05/10/2015 07:33  Glucose-Capillary Latest Ref Range: 65-99 mg/dL 147 (H) 144 (H) 201 (H) 375 (H) 258 (H)    Diabetes history: Type 2 diabetes Outpatient Diabetes medications: Levemir 40 units q HS Current orders for Inpatient glycemic control: Novolog 4 units tid with meals, Novolog moderate tid with meals, Levemir 40 units q HS, Prednisone 40 mg with breakfast Inpatient Diabetes Program Recommendations:   While on PO Prednisone, please consider increasing Novolog meal coverage to 6 units tid with meals.  Thanks, Adah Perl, RN, BC-ADM Inpatient Diabetes Coordinator Pager [phone]

## 2015-05-11 ENCOUNTER — Encounter: Payer: Self-pay | Admitting: Radiology

## 2015-05-11 ENCOUNTER — Inpatient Hospital Stay: Payer: Non-veteran care

## 2015-05-11 LAB — BASIC METABOLIC PANEL
ANION GAP: 5 (ref 5–15)
BUN: 38 mg/dL — AB (ref 6–20)
CHLORIDE: 108 mmol/L (ref 101–111)
CO2: 21 mmol/L — AB (ref 22–32)
CREATININE: 1.39 mg/dL — AB (ref 0.61–1.24)
Calcium: 8 mg/dL — ABNORMAL LOW (ref 8.9–10.3)
GFR calc non Af Amer: 50 mL/min — ABNORMAL LOW (ref >60)
GFR, EST AFRICAN AMERICAN: 58 mL/min — AB (ref >60)
Glucose, Bld: 267 mg/dL — ABNORMAL HIGH (ref 65–99)
Potassium: 4.5 mmol/L (ref 3.5–5.1)
SODIUM: 134 mmol/L — AB (ref 135–145)

## 2015-05-11 LAB — CBC
HCT: 33.4 % — ABNORMAL LOW (ref 40.0–52.0)
Hemoglobin: 11.1 g/dL — ABNORMAL LOW (ref 13.0–18.0)
MCH: 27.4 pg (ref 26.0–34.0)
MCHC: 33.3 g/dL (ref 32.0–36.0)
MCV: 82.3 fL (ref 80.0–100.0)
PLATELETS: 247 10*3/uL (ref 150–440)
RBC: 4.06 MIL/uL — ABNORMAL LOW (ref 4.40–5.90)
RDW: 15.4 % — AB (ref 11.5–14.5)
WBC: 9.2 10*3/uL (ref 3.8–10.6)

## 2015-05-11 LAB — TSH: TSH: 1.599 u[IU]/mL (ref 0.350–4.500)

## 2015-05-11 LAB — TROPONIN I: TROPONIN I: 0.07 ng/mL — AB (ref <0.031)

## 2015-05-11 LAB — GLUCOSE, CAPILLARY
Glucose-Capillary: 187 mg/dL — ABNORMAL HIGH (ref 65–99)
Glucose-Capillary: 121 mg/dL — ABNORMAL HIGH (ref 65–99)
Glucose-Capillary: 161 mg/dL — ABNORMAL HIGH (ref 65–99)
Glucose-Capillary: 221 mg/dL — ABNORMAL HIGH (ref 65–99)

## 2015-05-11 LAB — SEDIMENTATION RATE: SED RATE: 43 mm/h — AB (ref 0–20)

## 2015-05-11 MED ORDER — IOHEXOL 350 MG/ML SOLN
100.0000 mL | Freq: Once | INTRAVENOUS | Status: AC | PRN
  Administered 2015-05-11: 75 mL via INTRAVENOUS

## 2015-05-11 MED ORDER — HYDROCOD POLST-CPM POLST ER 10-8 MG/5ML PO SUER
5.0000 mL | Freq: Two times a day (BID) | ORAL | Status: DC | PRN
  Administered 2015-05-11 – 2015-05-13 (×2): 5 mL via ORAL
  Filled 2015-05-11 (×2): qty 5

## 2015-05-11 MED ORDER — ENOXAPARIN SODIUM 120 MG/0.8ML ~~LOC~~ SOLN
1.0000 mg/kg | Freq: Two times a day (BID) | SUBCUTANEOUS | Status: DC
  Administered 2015-05-11 – 2015-05-14 (×6): 105 mg via SUBCUTANEOUS
  Filled 2015-05-11 (×8): qty 0.8

## 2015-05-11 MED ORDER — SODIUM CHLORIDE 0.9 % IV SOLN
INTRAVENOUS | Status: DC
  Administered 2015-05-11 – 2015-05-12 (×2): via INTRAVENOUS

## 2015-05-11 MED ORDER — METOPROLOL TARTRATE 1 MG/ML IV SOLN
10.0000 mg | Freq: Once | INTRAVENOUS | Status: AC
  Administered 2015-05-11: 10 mg via INTRAVENOUS
  Filled 2015-05-11: qty 10

## 2015-05-11 MED ORDER — DILTIAZEM HCL 60 MG PO TABS
60.0000 mg | ORAL_TABLET | ORAL | Status: AC
  Administered 2015-05-11: 60 mg via ORAL
  Filled 2015-05-11: qty 1

## 2015-05-11 MED ORDER — IPRATROPIUM-ALBUTEROL 0.5-2.5 (3) MG/3ML IN SOLN
3.0000 mL | RESPIRATORY_TRACT | Status: DC | PRN
  Administered 2015-05-11 – 2015-05-13 (×3): 3 mL via RESPIRATORY_TRACT
  Filled 2015-05-11 (×3): qty 3

## 2015-05-11 MED ORDER — DILTIAZEM HCL 60 MG PO TABS
60.0000 mg | ORAL_TABLET | Freq: Four times a day (QID) | ORAL | Status: DC
  Administered 2015-05-11 – 2015-05-14 (×12): 60 mg via ORAL
  Filled 2015-05-11 (×16): qty 1

## 2015-05-11 NOTE — Progress Notes (Signed)
Inpatient Diabetes Program Recommendations  AACE/ADA: New Consensus Statement on Inpatient Glycemic Control (2015)  Target Ranges:  Prepandial:   less than 140 mg/dL      Peak postprandial:   less than 180 mg/dL (1-2 hours)      Critically ill patients:  140 - 180 mg/dL   Review of Glycemic Control  Results for HOLT, LEARD (MRN [id number]) as of 05/11/2015 11:04  Ref. Range 05/10/2015 07:33 05/10/2015 11:18 05/10/2015 16:33 05/10/2015 21:52 05/11/2015 07:27  Glucose-Capillary Latest Ref Range: 65-99 mg/dL 258 (H) 260 (H) 320 (H) 289 (H) 187 (H)     Diabetes history: Type 2 diabetes Outpatient Diabetes medications: Levemir 40 units q HS Current orders for Inpatient glycemic control: Novolog 4 units tid with meals, Novolog moderate tid with meals, Levemir 40 units q HS, Prednisone with breakfast Inpatient Diabetes Program Recommendations: Post Prandial blood sugars remain elevated- While on PO Prednisone, please consider increasing Novolog meal coverage to 6 units tid with meals.   Gentry Fitz, RN, BA, MHA, CDE Diabetes Coordinator Inpatient Diabetes Program  [phone] (Team Pager) [phone] ([place]) 05/11/2015 11:05 AM

## 2015-05-11 NOTE — Progress Notes (Signed)
Eric Glover Date of Admission:  05/06/2015     ID: Eric Glover is a 70 y.o. male with LBP and strep bacteremia  Principal Problem:   Acute on chronic kidney failure (HCC) Active Problems:   HLD (hyperlipidemia)   Type 2 diabetes mellitus (HCC)   HTN (hypertension)   Low back pain   Acute on chronic renal failure (HCC)   Fever   DDD (degenerative disc disease), lumbar   Sciatica associated with disorder of lumbar spine   Subjective: NO more fevers, still with back pain. FU bcx done 2/15.   ROS  Eleven systems are reviewed and negative except per hpi  Medications:  Antibiotics Given (last 72 hours)    Date/Time Action Medication Dose Rate   05/09/15 1133 Given  [Recieved from Pharmacy]   levofloxacin (LEVAQUIN) IVPB 750 mg 750 mg 100 mL/hr   05/09/15 2229 Given   cefTRIAXone (ROCEPHIN) 2 g in dextrose 5 % 50 mL IVPB 2 g 100 mL/hr   05/10/15 2003 Given   cefTRIAXone (ROCEPHIN) 2 g in dextrose 5 % 50 mL IVPB 2 g 100 mL/hr     . aspirin EC  81 mg Oral Daily  . atorvastatin  40 mg Oral q1800  . budesonide (PULMICORT) nebulizer solution  0.25 mg Nebulization BID  . cefTRIAXone (ROCEPHIN)  IV  2 g Intravenous Q24H  . docusate sodium  100 mg Oral BID  . doxazosin  8 mg Oral Daily  . fentaNYL  25 mcg Transdermal Q72H  . gabapentin  100 mg Oral QHS  . insulin aspart  0-15 Units Subcutaneous TID AC & HS  . insulin aspart  4 Units Subcutaneous TID WC  . insulin detemir  40 Units Subcutaneous QHS  . pantoprazole  40 mg Oral Daily  . [START ON 05/12/2015] predniSONE  20 mg Oral Q breakfast   Followed by  . [START ON 05/13/2015] predniSONE  10 mg Oral Q breakfast    Objective: Vital signs in last 24 hours: Temp:  [97.7 F (36.5 C)-98.2 F (36.8 C)] 97.9 F (36.6 C) (02/16 [id number]) Pulse Rate:  [76-173] 125 (02/16 1059) Resp:  [17-20] 20 (02/16 0614) BP: (105-142)/(63-79) 105/63 mmHg (02/16 1059) SpO2:  [90 %-95 %] 93 % (02/16  0816) Constitutional: He is oriented to person, place, and time. He appears well-developed and well-nourished. No distress. IN pain from back  HENT: anictoric, perrla Mouth/Throat: Oropharynx is clear and moist. No oropharyngeal exudate.  Cardiovascular: Normal rate, regular rhythm and normal heart sounds.2/6 smPulmonary/Chest: Effort normal and breath sounds normal. No respiratory distress. He has no wheezes.  Abdominal: Soft. Bowel sounds are normal. He exhibits no distension. There is no tenderness.  Lymphadenopathy: He has no cervical adenopathy.  Neurological: He is alert and oriented to person, place, and time.  Skin: Skin is warm and dry. No rash noted. No erythema.  Psychiatric: He has a normal mood and affect. His behavior is normal.  BAck pain   Lab Results  Recent Labs  05/10/15 0503 05/11/15 0500  WBC 9.6 9.2  HGB 10.9* 11.1*  HCT 32.8* 33.4*  NA 135 134*  K 4.7 4.5  CL 110 108  CO2 19* 21*  BUN 43* 38*  CREATININE 1.45* 1.39*   Echo Impressions:  - There was no evidence of a vegetation. The right ventricular systolic pressure was increased consistent with mild pulmonary hypertension. Normal EF but wall motion abnormalities suggestive of CAD.  Microbiology: Results for orders placed or performed  during the hospital encounter of 05/06/15  CULTURE, BLOOD (ROUTINE X 2) w Reflex to PCR ID Panel     Status: None (Preliminary result)   Collection Time: 05/08/15  9:34 AM  Result Value Ref Range Status   Specimen Description BLOOD LEFT ARM  Final   Special Requests   Final    BOTTLES DRAWN AEROBIC AND ANAEROBIC 7ML ARE 5ML ANA   Culture  Setup Time   Final    GRAM POSITIVE COCCI ANAEROBIC BOTTLE ONLY CRITICAL RESULT CALLED TO, READ BACK BY AND VERIFIED WITH: Eric Glover ON 2/14 17 MLZ    Culture   Final    GRAM POSITIVE COCCI IN BOTH AEROBIC AND ANAEROBIC BOTTLES IDENTIFICATION TO FOLLOW    Report Status PENDING  Incomplete  CULTURE,  BLOOD (ROUTINE X 2) w Reflex to PCR ID Panel     Status: None (Preliminary result)   Collection Time: 05/08/15  9:34 AM  Result Value Ref Range Status   Specimen Description BLOOD RT ARM  Final   Special Requests   Final    BOTTLES DRAWN AEROBIC AND ANAEROBIC 6ML ANA 9ML AER   Culture  Setup Time   Final    GRAM POSITIVE COCCI IN BOTH AEROBIC AND ANAEROBIC BOTTLES CRITICAL RESULT CALLED TO, READ BACK BY AND VERIFIED WITH: Eric Glover AT 1949 ON 05/09/15 MLZ    Culture   Final    STREPTOCOCCUS SPECIES IN BOTH AEROBIC AND ANAEROBIC BOTTLES IDENTIFICATION TO FOLLOW    Report Status PENDING  Incomplete  Blood Culture ID Panel (Reflexed)     Status: Abnormal   Collection Time: 05/08/15  9:34 AM  Result Value Ref Range Status   Enterococcus species NOT DETECTED NOT DETECTED Final   Listeria monocytogenes NOT DETECTED NOT DETECTED Final   Staphylococcus species NOT DETECTED NOT DETECTED Final   Staphylococcus aureus NOT DETECTED NOT DETECTED Final   Streptococcus species DETECTED (A) NOT DETECTED Final    Comment: CRITICAL RESULT CALLED TO, READ BACK BY AND VERIFIED WITH: CALLED TO Eric Glover AT 1949 ON 2/14 17 MLZ    Streptococcus agalactiae NOT DETECTED NOT DETECTED Final   Streptococcus pneumoniae NOT DETECTED NOT DETECTED Final   Streptococcus pyogenes NOT DETECTED NOT DETECTED Final   Acinetobacter baumannii NOT DETECTED NOT DETECTED Final   Enterobacteriaceae species NOT DETECTED NOT DETECTED Final   Enterobacter cloacae complex NOT DETECTED NOT DETECTED Final   Escherichia coli NOT DETECTED NOT DETECTED Final   Klebsiella oxytoca NOT DETECTED NOT DETECTED Final   Klebsiella pneumoniae NOT DETECTED NOT DETECTED Final   Proteus species NOT DETECTED NOT DETECTED Final   Serratia marcescens NOT DETECTED NOT DETECTED Final   Haemophilus influenzae NOT DETECTED NOT DETECTED Final   Neisseria meningitidis NOT DETECTED NOT DETECTED Final   Pseudomonas aeruginosa NOT DETECTED NOT  DETECTED Final   Candida albicans NOT DETECTED NOT DETECTED Final   Candida glabrata NOT DETECTED NOT DETECTED Final   Candida krusei NOT DETECTED NOT DETECTED Final   Candida parapsilosis NOT DETECTED NOT DETECTED Final   Candida tropicalis NOT DETECTED NOT DETECTED Final   Carbapenem resistance NOT DETECTED NOT DETECTED Final   Methicillin resistance NOT DETECTED NOT DETECTED Final   Vancomycin resistance NOT DETECTED NOT DETECTED Final  Culture, blood (Routine X 2) w Reflex to ID Panel     Status: None (Preliminary result)   Collection Time: 05/10/15  3:33 PM  Result Value Ref Range Status   Specimen Description BLOOD RIGHT AC  Final   Special Requests BOTTLES DRAWN AEROBIC AND ANAEROBIC 4ML  Final   Culture NO GROWTH < 24 HOURS  Final   Report Status PENDING  Incomplete  Culture, blood (Routine X 2) w Reflex to ID Panel     Status: None (Preliminary result)   Collection Time: 05/10/15  3:33 PM  Result Value Ref Range Status   Specimen Description BLOOD LEFT AC  Final   Special Requests   Final    BOTTLES DRAWN AEROBIC AND ANAEROBIC AER 10ML ANA 4ML   Culture NO GROWTH < 24 HOURS  Final   Report Status PENDING  Incomplete    Studies/Results: Dg Chest 2 View  05/11/2015  CLINICAL DATA:  Short of breath EXAM: CHEST  2 VIEW COMPARISON:  05/08/2015 FINDINGS: Negative for heart failure or pneumonia. Mild atelectasis in the lung bases. No pleural effusion. IMPRESSION: Mild bibasilar atelectasis. Electronically Signed   By: Franchot Gallo M.D.   On: 05/11/2015 09:19   Mr Lumbar Spine Wo Contrast  05/10/2015  CLINICAL DATA:  Chronic low back pain which began to worsen approximately 3 days ago. Difficulty standing. Pain is eccentric to the right side of the low back and radiates into the right leg with tingling in the anterior aspect of the right thigh. Initial encounter. EXAM: MRI LUMBAR SPINE WITHOUT CONTRAST TECHNIQUE: Multiplanar, multisequence MR imaging of the lumbar spine was  performed. No intravenous contrast was administered. COMPARISON:  CT lumbar spine 05/07/2015. FINDINGS: Rudimentary disc material is seen at the S1-2 level. This is the lowest level imaged the axial plane. There is no fracture. Scattered Schmorl's nodes are noted. Facet degenerative disease results in 0.7 cm anterolisthesis L5 on S1. There is also convex right scoliosis. Scattered degenerative endplate signal change is seen. Small hemangioma is noted in T12. There is no worrisome marrow lesion. The conus medullaris is normal in signal and position. T12-L1:  Negative. L1-2: Shallow disc bulge more prominent to the left. There is mild narrowing in the left lateral recess without nerve root compression. The foramina are open. L2-3: Shallow disc bulge and ligamentum flavum thickening cause mild central canal narrowing. The foramina are open. No nerve root compression. L3-4: There is a disc bulge with ligamentum flavum thickening and facet degenerative change. Moderate central canal stenosis is identified. Mild left foraminal narrowing is seen. The right foramen is open. L4-5: Shallow disc bulge, ligamentum flavum thickening and facet arthropathy cause moderate central canal narrowing. Mild bilateral foraminal narrowing is also seen. L5-S1: The disc is uncovered with a shallow bulge. There is bilateral facet degenerative disease. The central canal is mildly narrowed. Mild left and moderately severe to severe right foraminal narrowing is present. S1-2: Rudimentary disc material. The central canal and foramina are open. 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 Moderately severe to severe right foraminal narrowing is present this level. There is mild left foraminal narrowing. The central canal is open. Moderate central canal narrowing L3-4 and L4-5 due to shallow disc bulge ligamentum flavum thickening. Electronically Signed   By: Inge Rise M.D.   On: 05/10/2015 13:56    Assessment/Plan: Assessment:  Eric Glover is a 70 y.o. male admitted with LBP and found to have fevers, strep bacteremia MRI neg for osteomyelitis. Had recent dental work. Echo neg. Yet to ID organism Recommendations Repeat bcx neg from 2/15 - will need TEE if bcx is viridans strep Continue ceftriaxone.  Thank you very much for  the consult. Will follow with you.  [place], [place]   05/11/2015, 11:06 AM

## 2015-05-11 NOTE — Plan of Care (Signed)
Problem: Cardiac: Goal: Ability to achieve and maintain adequate cardiopulmonary perfusion will improve Outcome: Progressing Patient being monitored 24/7 on telemetry. Patient still in afib, rate 100s-120s, SQ lovenox added to medication regimen.   Problem: Education: Goal: Knowledge of disease or condition will improve Outcome: Progressing Education provided on atrial fibrillation, stroke prevention and new cardiac medications both verbally and via handouts.

## 2015-05-11 NOTE — Consult Note (Signed)
[facility]  CARDIOLOGY CONSULT NOTE  Patient ID: Eric Glover MRN: [id number] DOB/AGE: 70-Nov-1947 70 y.o.  Admit date: 05/06/2015 Referring Physician Dr. Darvin Neighbours Primary Physician   Primary Cardiologist  Dr. Clayborn Bigness Reason for Consultation chf  HPI: Pt is a 70 yo male with history of dm, htn and ckd who was admittd with progressive back pain, fevers and gram + strep. He was noted to be in afib with rapid vr. He had no prior history of afib. Echo read by Dr. Harlin Heys normal lv function with moderate tr. No evidence of valvular infection. He was treated with abx and placed on cardizem 60 q 6. He has converted to nsr. He has no known prior afib. He has ruled out for an mi. CXR revealed mild bilateral atelectasis. He complains of back pain but no chest pain.   ROS Review of Systems - History obtained from chart review General ROS: positive for  - fatigue and back pain and sob Respiratory ROS: positive for - cough and shortness of breath Cardiovascular ROS: no chest pain or dyspnea on exertion Gastrointestinal ROS: no abdominal pain, change in bowel habits, or black or bloody stools Musculoskeletal ROS: positive for - muscle pain Neurological ROS: no TIA or stroke symptoms   Past Medical History  Diagnosis Date  . Diabetes mellitus without complication ([place])   . GERD (gastroesophageal reflux disease)   . Hyperlipidemia   . Hypertension   . CKD (chronic kidney disease), stage III   . Gout     Family History  Problem Relation Age of Onset  . Alcohol abuse Paternal Uncle   . Early death Paternal Uncle   . Arthritis Mother   . Cancer Mother   . Diabetes Mother   . Hearing loss Mother   . Heart disease Mother   . Hyperlipidemia Mother   . Hypertension Mother   . Kidney disease Mother   . Diabetes Father   . Hearing loss Father     Social History   Social History  . Marital Status: Married    Spouse Name: N/A  . Number of  Children: N/A  . Years of Education: N/A   Occupational History  . Not on file.   Social History Main Topics  . Smoking status: Never Smoker   . Smokeless tobacco: Not on file  . Alcohol Use: No  . Drug Use: No  . Sexual Activity: Not on file   Other Topics Concern  . Not on file   Social History Narrative    Past Surgical History  Procedure Laterality Date  . Shoulder arthroscopy w/ rotator cuff repair       Prescriptions prior to admission  Medication Sig Dispense Refill Last Dose  . allopurinol (ZYLOPRIM) 100 MG tablet Take 100 mg by mouth.   Taking at Unknown time  . aspirin EC 81 MG tablet Take 81 mg by mouth.   Taking at Unknown time  . beclomethasone (QVAR) 80 MCG/ACT inhaler Inhale 2 puffs into the lungs 2 (two) times daily. (Patient taking differently: Inhale 2 puffs into the lungs 2 (two) times daily as needed (shortness of breath). ) 1 Inhaler 12 Taking at prn  . colchicine (COLCRYS) 0.6 MG tablet Take 0.6 mg by mouth daily as needed (gout flair).    Taking at prn  . doxazosin (CARDURA) 8 MG tablet Take 8 mg by mouth at bedtime.    Taking at Unknown time  . fluticasone (FLONASE) 50 MCG/ACT nasal  spray Place 2 sprays into both nostrils daily. (Patient taking differently: Place 2 sprays into both nostrils daily as needed for allergies or rhinitis. ) 16 g 6 Taking at prn  . furosemide (LASIX) 20 MG tablet Take 20 mg by mouth daily.    Taking at Unknown time  . HYDROcodone-acetaminophen (NORCO/VICODIN) 5-325 MG tablet Take 1 tablet by mouth every 6 (six) hours as needed for moderate pain.    Taking at prn  . insulin detemir (LEVEMIR) 100 UNIT/ML injection Inject 40 Units into the skin at bedtime.    Taking at Unknown time  . lansoprazole (PREVACID) 15 MG capsule Take 15 mg by mouth daily.    Taking at Unknown time  . lisinopril (PRINIVIL,ZESTRIL) 40 MG tablet Take 40 mg by mouth daily.    Taking at Unknown time  . simvastatin (ZOCOR) 80 MG tablet Take 80 mg by mouth daily  at 6 PM.    Taking at Unknown time  . tiZANidine (ZANAFLEX) 4 MG tablet Take 4 mg by mouth every 12 (twelve) hours as needed for muscle spasms.    Taking at Unknown time  . traMADol (ULTRAM) 50 MG tablet Take 1 tablet (50 mg total) by mouth every 8 (eight) hours as needed. (Patient taking differently: Take 50 mg by mouth every 8 (eight) hours as needed for moderate pain. ) 30 tablet 0 prn at prn  . vitamin B-12 (CYANOCOBALAMIN) 500 MCG tablet Take 500 mcg by mouth daily.    Taking at Unknown time  . ipratropium (ATROVENT) 0.03 % nasal spray Place 2 sprays into both nostrils every 12 (twelve) hours. (Patient not taking: Reported on 05/09/2015) 30 mL 12 Not Taking at prn    Physical Exam: Blood pressure 128/79, pulse 117, temperature 97.9 F (36.6 C), temperature source Oral, resp. rate 18, height 6\' 2"  (1.88 m), weight 107.049 kg (236 lb), SpO2 92 %.   General appearance: alert and cooperative Back: back pain Resp: clear to auscultation bilaterally Cardio: regular rate and rhythm GI: soft, non-tender; bowel sounds normal; no masses,  no organomegaly Extremities: extremities normal, atraumatic, no cyanosis or edema Neurologic: Grossly normal Labs:   Lab Results  Component Value Date   WBC 9.2 05/11/2015   HGB 11.1* 05/11/2015   HCT 33.4* 05/11/2015   MCV 82.3 05/11/2015   PLT 247 05/11/2015    Recent Labs Lab 05/06/15 1540  05/11/15 0500  NA 131*  < > 134*  K 5.5*  < > 4.5  CL 105  < > 108  CO2 18*  < > 21*  BUN 57*  < > 38*  CREATININE 2.16*  < > 1.39*  CALCIUM 8.7*  < > 8.0*  PROT 7.1  --   --   BILITOT 1.1  --   --   ALKPHOS 67  --   --   ALT 22  --   --   AST 28  --   --   GLUCOSE 331*  < > 267*  < > = values in this interval not displayed. Lab Results  Component Value Date   TROPONINI 0.07* 05/11/2015      Radiology: bilateral atelectasis EKG: afib with rvr followed by nsr  ASSESSMENT AND PLAN:  70 yo male with history of back pain admitted with progressive  back pain. He was also weak and sob. He was noted to be in afib with rvr. He also was noted to have positive blood cultures. Echo revealed normal lv funciton with no evidence of  endocarditis. He was initially in afib but has converted to nsr. CHADS2VASc score is 1. Mild troponin elevation appears to be secondary to tachycardia. No evidence of nstemi. Afib is likely secondary to infection. Would continue with cardizem for now and defer chornic anticoagulation for now. Contineu with abx. Signed: Teodoro Spray MD, [facility] 05/11/2015, 8:43 PM

## 2015-05-11 NOTE — Progress Notes (Signed)
Eric Glover, ICU charge nurse pushed 10mg  of IV Lopressor.

## 2015-05-11 NOTE — Plan of Care (Signed)
Problem: Pain Managment: Goal: General experience of comfort will improve Outcome: Progressing PRN pain medication given for chronic back pain.

## 2015-05-11 NOTE — Progress Notes (Signed)
CRITICAL VALUE ALERT  Critical value received:  Troponin 0.07  Date of notification:  05/11/2015   Time of notification:  1700  Critical value read back:Yes.    Nurse who received alert:  Legrand Pitts, RN  MD notified (1st page):  Sudini  Time of first page:  1705  MD notified (2nd page): n/a   Time of second page: n/a  Responding MD:  Darvin Neighbours  Time MD responded:  [id number]

## 2015-05-11 NOTE — Progress Notes (Signed)
[facility] [place] NAME: Eric Glover    MR#:  [id number]  DATE OF BIRTH:  03/27/1945  SUBJECTIVE:   Patient here due to intractable back pain also noted to be in acute on chronic renal failure. Patient had an epidural injection but says his back pain is not much improved.   Today he is more SOB with wheezing and cough.  Tachycardia into 170s with Afib.  REVIEW OF SYSTEMS:    Review of Systems  Constitutional: Negative for fever and chills.  HENT: Negative for congestion and tinnitus.   Eyes: Negative for blurred vision and double vision.  Respiratory: Positive for cough, shortness of breath and wheezing.   Cardiovascular: Negative for chest pain, orthopnea and PND.  Gastrointestinal: Negative for nausea, vomiting, abdominal pain and diarrhea.  Genitourinary: Negative for dysuria and hematuria.  Musculoskeletal: Positive for back pain.  Neurological: Positive for weakness (Generalized. ). Negative for dizziness, sensory change and focal weakness.  All other systems reviewed and are negative.   Nutrition: Heart healthy/Carb control Tolerating Diet: yes Tolerating PT:  Eval noted.   DRUG ALLERGIES:   Allergies  Allergen Reactions  . Neomycin-Bacitracin Zn-Polymyx Rash    VITALS:  Blood pressure 95/62, pulse 96, temperature 97.5 F (36.4 C), temperature source Oral, resp. rate 16, height 6\' 2"  (1.88 m), weight 107.049 kg (236 lb), SpO2 91 %.  PHYSICAL EXAMINATION:   Physical Exam  GENERAL:  70 y.o.-year-old patient lying in the bed in no acute distress.  EYES: Pupils equal, round, reactive to light and accommodation. No scleral icterus. Extraocular muscles intact.  HEENT: Head atraumatic, normocephalic. Oropharynx and nasopharynx clear. Moist oral mucosa NECK:  Supple, no jugular venous distention. No thyroid enlargement, no tenderness.  LUNGS: Increased work of breathing. Occasional wheeze. CARDIOVASCULAR:  S1, S2 irregular, tachycardia ABDOMEN: Soft, nontender, nondistended. Bowel sounds present. No organomegaly or mass.  EXTREMITIES: No cyanosis, clubbing or edema b/l.    NEUROLOGIC: Cranial nerves II through XII are intact. No focal Motor or sensory deficits b/l. Globally weak due to back pain. PSYCHIATRIC: The patient is alert and oriented x 3.  SKIN: No obvious rash, lesion, or ulcer.    LABORATORY PANEL:   CBC  Recent Labs Lab 05/11/15 0500  WBC 9.2  HGB 11.1*  HCT 33.4*  PLT 247   ------------------------------------------------------------------------------------------------------------------  Chemistries   Recent Labs Lab 05/06/15 1540  05/11/15 0500  NA 131*  < > 134*  K 5.5*  < > 4.5  CL 105  < > 108  CO2 18*  < > 21*  GLUCOSE 331*  < > 267*  BUN 57*  < > 38*  CREATININE 2.16*  < > 1.39*  CALCIUM 8.7*  < > 8.0*  AST 28  --   --   ALT 22  --   --   ALKPHOS 67  --   --   BILITOT 1.1  --   --   < > = values in this interval not displayed. ------------------------------------------------------------------------------------------------------------------  Cardiac Enzymes No results for input(s): TROPONINI in the last 168 hours. ------------------------------------------------------------------------------------------------------------------  RADIOLOGY:  Dg Chest 2 View  05/11/2015  CLINICAL DATA:  Short of breath EXAM: CHEST  2 VIEW COMPARISON:  05/08/2015 FINDINGS: Negative for heart failure or pneumonia. Mild atelectasis in the lung bases. No pleural effusion. IMPRESSION: Mild bibasilar atelectasis. Electronically Signed   By: Franchot Gallo M.D.   On: 05/11/2015 09:19   Mr Lumbar Spine Wo Contrast  05/10/2015  CLINICAL DATA:  Chronic low back pain which began to worsen approximately 3 days ago. Difficulty standing. Pain is eccentric to the right side of the low back and radiates into the right leg with tingling in the anterior aspect of the right thigh.  Initial encounter. EXAM: MRI LUMBAR SPINE WITHOUT CONTRAST TECHNIQUE: Multiplanar, multisequence MR imaging of the lumbar spine was performed. No intravenous contrast was administered. COMPARISON:  CT lumbar spine 05/07/2015. FINDINGS: Rudimentary disc material is seen at the S1-2 level. This is the lowest level imaged the axial plane. There is no fracture. Scattered Schmorl's nodes are noted. Facet degenerative disease results in 0.7 cm anterolisthesis L5 on S1. There is also convex right scoliosis. Scattered degenerative endplate signal change is seen. Small hemangioma is noted in T12. There is no worrisome marrow lesion. The conus medullaris is normal in signal and position. T12-L1:  Negative. L1-2: Shallow disc bulge more prominent to the left. There is mild narrowing in the left lateral recess without nerve root compression. The foramina are open. L2-3: Shallow disc bulge and ligamentum flavum thickening cause mild central canal narrowing. The foramina are open. No nerve root compression. L3-4: There is a disc bulge with ligamentum flavum thickening and facet degenerative change. Moderate central canal stenosis is identified. Mild left foraminal narrowing is seen. The right foramen is open. L4-5: Shallow disc bulge, ligamentum flavum thickening and facet arthropathy cause moderate central canal narrowing. Mild bilateral foraminal narrowing is also seen. L5-S1: The disc is uncovered with a shallow bulge. There is bilateral facet degenerative disease. The central canal is mildly narrowed. Mild left and moderately severe to severe right foraminal narrowing is present. S1-2: Rudimentary disc material. The central canal and foramina are open. 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 Moderately severe to severe right foraminal narrowing is present this level. There is mild left foraminal narrowing. The central canal is open. Moderate  central canal narrowing L3-4 and L4-5 due to shallow disc bulge ligamentum flavum thickening. Electronically Signed   By: Inge Rise M.D.   On: 05/10/2015 13:56     ASSESSMENT AND PLAN:   70 year old male with past medical history of diabetes type 2 without complication, GERD, hypertension, hyperlipidemia, chronic kidney disease stage III, gout who presents to the hospital with back pain and noted to be in acute on chronic renal failure.  # Afib with RVR - Likely from sepsis Stat dose of IV lopressor and PO cardizem given. Start cardizem 60 mg Q 6hrs Transfer to Tele. Check troponin, TSH. CTA chest for PE. Consult cardiology.  # Intractable back pain-patient's x-ray on admission showed some mild DJD.  - CT scan of the lumbar spine showed significant disc bulge at L5-S1 level along with central canal stenosis and some neural foramen narrowing and nerve impingement. -Status post epidural injection done by anesthesia yesterday. Pain does not seem to be much improved. -Continue supportive care with pain medications and I will start Fentanyl patch, continue as needed Dilaudid, Oxycodone and monitor.   -Patient was having fevers as was noted to be bacteremic and underwent an MRI today to rule out discitis which is negative. -Continue PT as tolerated but patient refuses skilled nursing facility.  # sepsis due to streptococcus bacteremia-patient spiked a fever of 103 two days ago and yesterday a low grade fever.  -His blood cultures positive for Streptococcus but not identified yet. -MRI of the lumbar spine negative for any acute discitis.  Appreciate infectious disease input. Echo- No endocarditis. Repeat blood cultures have been ordered to make sure they're clearing. -Continue ceftriaxone for now. Will need TEE if viridans Strep  # acute on chronic renal failure-Likely due to dehydration.  -Improved and back to baseline with IV fluids.  # Hyperkalemia mild and due to acute on  chronic renal failure.  - resolved now.   # DM Type II w/out complication - BS a bit elevated due to steroids.  -taper the patient's steroids. - cont. Levemir, Novolog with meals and cont.  SSI  # BPH - cont. Doxazosin.    # Hyperlipidemia - cont. Hyperlipidemia.   Pt. Belongs to the [place] and paperwork has been faxed over by CM. They are on Diversion presently.   All the records are reviewed and case discussed with Care Management/Social Workerr. Management plans discussed with the patient, family and they are in agreement.  CODE STATUS: Full  DVT Prophylaxis: SCD's.   TOTAL CRITICAL CARE TIME TAKING CARE OF THIS PATIENT: 35 minutes.     Hillary Bow R M.D on 05/11/2015 at 2:22 PM  Between 7am to 6pm - Pager - [phone]  After 6pm go to [email or web address] - password [email or web address] [facility]  Office  [phone]  CC: Primary care physician; Ashok Norris, MD

## 2015-05-11 NOTE — Care Management (Signed)
Consent for transfer and updated progress note faxed to [facility] and [facility]

## 2015-05-11 NOTE — Progress Notes (Signed)
Patient transferred from [facility] to 2A [id number]. Oriented to room, Ascom phones, call bell and staff. Bed in low position. Fall safety plan reviewed, bed alarm on. Full assessment to Epic; skin assessed with Earlyne Iba, RN. Telemetry box verified with tele clerk and Payton Spark, NT: [phone]. Will continue to monitor.

## 2015-05-11 NOTE — Progress Notes (Signed)
Report given to [facility], RN. Will transfer pt to 2A once pt comes back from CT.

## 2015-05-11 NOTE — Progress Notes (Signed)
Paged MD regarding pt's elevated HR after giving one time dose of Cardizem. Bp stable. Orders given for IV Lopressor. Will call supervisor to push.

## 2015-05-11 NOTE — Care Management (Signed)
Notified by Margarita Grizzle at the [facility] that patient has been accepted pending a bed becoming available.

## 2015-05-11 NOTE — Progress Notes (Signed)
Pt started coughing after getting up to use urinal, Pt was tachycardic (around 180's)  and RT came to provide breathing treatment. EKG was ordered and EKG showed Afib with rapid ventricular response. MD was paged and orders were placed. Will continue to assess.

## 2015-05-12 ENCOUNTER — Inpatient Hospital Stay: Payer: Non-veteran care

## 2015-05-12 LAB — CULTURE, BLOOD (ROUTINE X 2)

## 2015-05-12 LAB — GLUCOSE, CAPILLARY
GLUCOSE-CAPILLARY: 121 mg/dL — AB (ref 65–99)
GLUCOSE-CAPILLARY: 278 mg/dL — AB (ref 65–99)
Glucose-Capillary: 211 mg/dL — ABNORMAL HIGH (ref 65–99)
Glucose-Capillary: 290 mg/dL — ABNORMAL HIGH (ref 65–99)

## 2015-05-12 LAB — TROPONIN I
Troponin I: 0.06 ng/mL — ABNORMAL HIGH (ref <0.031)
Troponin I: 0.05 ng/mL — ABNORMAL HIGH (ref <0.031)

## 2015-05-12 MED ORDER — BENZONATATE 100 MG PO CAPS
100.0000 mg | ORAL_CAPSULE | ORAL | Status: DC | PRN
  Administered 2015-05-12: 100 mg via ORAL
  Filled 2015-05-12: qty 1

## 2015-05-12 MED ORDER — METOPROLOL TARTRATE 1 MG/ML IV SOLN
5.0000 mg | Freq: Four times a day (QID) | INTRAVENOUS | Status: DC | PRN
  Administered 2015-05-13: 5 mg via INTRAVENOUS
  Filled 2015-05-12 (×2): qty 5

## 2015-05-12 NOTE — Progress Notes (Signed)
Bcx have Strep Mutans and Discussed with Dr. Ubaldo Glassing regarding scheduling TEE

## 2015-05-12 NOTE — Progress Notes (Signed)
[facility] INFECTIOUS DISEASE PROGRESS NOTE Date of Admission:  05/06/2015     ID: Syler Salmons is a 70 y.o. male with LBP and strep bacteremia  Principal Problem:   Acute on chronic kidney failure (HCC) Active Problems:   HLD (hyperlipidemia)   Type 2 diabetes mellitus (HCC)   HTN (hypertension)   Low back pain   Acute on chronic renal failure (HCC)   Fever   DDD (degenerative disc disease), lumbar   Sciatica associated with disorder of lumbar spine   Subjective: NO more fevers, still with back pain. FU bcx done 2/15.   ROS  Eleven systems are reviewed and negative except per hpi  Medications:  Antibiotics Given (last 72 hours)    Date/Time Action Medication Dose Rate   05/09/15 2229 Given   cefTRIAXone (ROCEPHIN) 2 g in dextrose 5 % 50 mL IVPB 2 g 100 mL/hr   05/10/15 2003 Given   cefTRIAXone (ROCEPHIN) 2 g in dextrose 5 % 50 mL IVPB 2 g 100 mL/hr   05/11/15 2224 Given   cefTRIAXone (ROCEPHIN) 2 g in dextrose 5 % 50 mL IVPB 2 g 100 mL/hr   05/12/15 1748 Given   cefTRIAXone (ROCEPHIN) 2 g in dextrose 5 % 50 mL IVPB 2 g 100 mL/hr     . aspirin EC  81 mg Oral Daily  . atorvastatin  40 mg Oral q1800  . budesonide (PULMICORT) nebulizer solution  0.25 mg Nebulization BID  . cefTRIAXone (ROCEPHIN)  IV  2 g Intravenous Q24H  . diltiazem  60 mg Oral 4 times per day  . docusate sodium  100 mg Oral BID  . doxazosin  8 mg Oral Daily  . enoxaparin (LOVENOX) injection  1 mg/kg Subcutaneous Q12H  . fentaNYL  25 mcg Transdermal Q72H  . gabapentin  100 mg Oral QHS  . insulin aspart  0-15 Units Subcutaneous TID AC & HS  . insulin aspart  4 Units Subcutaneous TID WC  . insulin detemir  40 Units Subcutaneous QHS  . pantoprazole  40 mg Oral Daily  . [START ON 05/13/2015] predniSONE  10 mg Oral Q breakfast    Objective: Vital signs in last 24 hours: Temp:  [97.6 F (36.4 C)-98.4 F (36.9 C)] 97.6 F (36.4 C) (02/17 2038) Pulse Rate:  [84-107] 102 (02/17 2038) Resp:   [18-26] 26 (02/17 2038) BP: (115-176)/(63-80) 176/74 mmHg (02/17 2038) SpO2:  [91 %-98 %] 98 % (02/17 2038) Constitutional: He is oriented to person, place, and time. He appears well-developed and well-nourished. No distress. IN pain from back  HENT: anictoric, perrla Mouth/Throat: Oropharynx is clear and moist. No oropharyngeal exudate.  Cardiovascular: Normal rate, regular rhythm and normal heart sounds.2/6 smPulmonary/Chest: Effort normal and breath sounds normal. No respiratory distress. He has no wheezes.  Abdominal: Soft. Bowel sounds are normal. He exhibits no distension. There is no tenderness.  Lymphadenopathy: He has no cervical adenopathy.  Neurological: He is alert and oriented to person, place, and time.  Skin: Skin is warm and dry. No rash noted. No erythema.  Psychiatric: He has a normal mood and affect. His behavior is normal.  BAck pain   Lab Results  Recent Labs  05/10/15 0503 05/11/15 0500  WBC 9.6 9.2  HGB 10.9* 11.1*  HCT 32.8* 33.4*  NA 135 134*  K 4.7 4.5  CL 110 108  CO2 19* 21*  BUN 43* 38*  CREATININE 1.45* 1.39*   Echo Impressions:  - There was no evidence of a vegetation.  The right ventricular systolic pressure was increased consistent with mild pulmonary hypertension. Normal EF but wall motion abnormalities suggestive of CAD.  Microbiology: Results for orders placed or performed during the hospital encounter of 05/06/15  CULTURE, BLOOD (ROUTINE X 2) w Reflex to PCR ID Panel     Status: None   Collection Time: 05/08/15  9:34 AM  Result Value Ref Range Status   Specimen Description BLOOD LEFT ARM  Final   Special Requests   Final    BOTTLES DRAWN AEROBIC AND ANAEROBIC 7ML ARE 5ML ANA   Culture  Setup Time   Final    GRAM POSITIVE COCCI IN BOTH AEROBIC AND ANAEROBIC BOTTLES CRITICAL RESULT CALLED TO, READ BACK BY AND VERIFIED WITH: JASON ROBBINS AT 1949 ON 2/14 17 MLZ    Culture   Final    STREPTOCOCCUS MUTANS IN BOTH AEROBIC  AND ANAEROBIC BOTTLES    Report Status 05/12/2015 FINAL  Final   Organism ID, Bacteria STREPTOCOCCUS MUTANS  Final      Susceptibility   Streptococcus mutans - MIC*    ERYTHROMYCIN Value in next row Sensitive      SENSITIVE<=0.12    LEVOFLOXACIN Value in next row Sensitive      SENSITIVE2    VANCOMYCIN Value in next row Sensitive      SENSITIVE1    * STREPTOCOCCUS MUTANS  CULTURE, BLOOD (ROUTINE X 2) w Reflex to PCR ID Panel     Status: None   Collection Time: 05/08/15  9:34 AM  Result Value Ref Range Status   Specimen Description BLOOD RT ARM  Final   Special Requests   Final    BOTTLES DRAWN AEROBIC AND ANAEROBIC 6ML ANA 9ML AER   Culture  Setup Time   Final    GRAM POSITIVE COCCI IN BOTH AEROBIC AND ANAEROBIC BOTTLES CRITICAL RESULT CALLED TO, READ BACK BY AND VERIFIED WITH: JASON ROBBINS AT 1949 ON 05/09/15 MLZ    Culture   Final    STREPTOCOCCUS MUTANS IN BOTH AEROBIC AND ANAEROBIC BOTTLES    Report Status 05/12/2015 FINAL  Final   Organism ID, Bacteria STREPTOCOCCUS MUTANS  Final      Susceptibility   Streptococcus mutans - MIC*    ERYTHROMYCIN Value in next row Sensitive      SENSITIVE<=0.12    LEVOFLOXACIN Value in next row Sensitive      SENSITIVE2    VANCOMYCIN Value in next row Sensitive      SENSITIVE1    * STREPTOCOCCUS MUTANS  Blood Culture ID Panel (Reflexed)     Status: Abnormal   Collection Time: 05/08/15  9:34 AM  Result Value Ref Range Status   Enterococcus species NOT DETECTED NOT DETECTED Final   Listeria monocytogenes NOT DETECTED NOT DETECTED Final   Staphylococcus species NOT DETECTED NOT DETECTED Final   Staphylococcus aureus NOT DETECTED NOT DETECTED Final   Streptococcus species DETECTED (A) NOT DETECTED Final    Comment: CRITICAL RESULT CALLED TO, READ BACK BY AND VERIFIED WITH: CALLED TO JASON ROBBINS AT 1949 ON 2/14 17 MLZ    Streptococcus agalactiae NOT DETECTED NOT DETECTED Final   Streptococcus pneumoniae NOT DETECTED NOT DETECTED  Final   Streptococcus pyogenes NOT DETECTED NOT DETECTED Final   Acinetobacter baumannii NOT DETECTED NOT DETECTED Final   Enterobacteriaceae species NOT DETECTED NOT DETECTED Final   Enterobacter cloacae complex NOT DETECTED NOT DETECTED Final   Escherichia coli NOT DETECTED NOT DETECTED Final   Klebsiella oxytoca NOT DETECTED NOT DETECTED  Final   Klebsiella pneumoniae NOT DETECTED NOT DETECTED Final   Proteus species NOT DETECTED NOT DETECTED Final   Serratia marcescens NOT DETECTED NOT DETECTED Final   Haemophilus influenzae NOT DETECTED NOT DETECTED Final   Neisseria meningitidis NOT DETECTED NOT DETECTED Final   Pseudomonas aeruginosa NOT DETECTED NOT DETECTED Final   Candida albicans NOT DETECTED NOT DETECTED Final   Candida glabrata NOT DETECTED NOT DETECTED Final   Candida krusei NOT DETECTED NOT DETECTED Final   Candida parapsilosis NOT DETECTED NOT DETECTED Final   Candida tropicalis NOT DETECTED NOT DETECTED Final   Carbapenem resistance NOT DETECTED NOT DETECTED Final   Methicillin resistance NOT DETECTED NOT DETECTED Final   Vancomycin resistance NOT DETECTED NOT DETECTED Final  Culture, blood (Routine X 2) w Reflex to ID Panel     Status: None (Preliminary result)   Collection Time: 05/10/15  3:33 PM  Result Value Ref Range Status   Specimen Description BLOOD RIGHT AC  Final   Special Requests BOTTLES DRAWN AEROBIC AND ANAEROBIC 4ML  Final   Culture NO GROWTH < 24 HOURS  Final   Report Status PENDING  Incomplete  Culture, blood (Routine X 2) w Reflex to ID Panel     Status: None (Preliminary result)   Collection Time: 05/10/15  3:33 PM  Result Value Ref Range Status   Specimen Description BLOOD LEFT AC  Final   Special Requests   Final    BOTTLES DRAWN AEROBIC AND ANAEROBIC AER 10ML ANA 4ML   Culture NO GROWTH < 24 HOURS  Final   Report Status PENDING  Incomplete    Studies/Results: Dg Chest 2 View  05/11/2015  CLINICAL DATA:  Short of breath EXAM: CHEST  2 VIEW  COMPARISON:  05/08/2015 FINDINGS: Negative for heart failure or pneumonia. Mild atelectasis in the lung bases. No pleural effusion. IMPRESSION: Mild bibasilar atelectasis. Electronically Signed   By: Franchot Gallo M.D.   On: 05/11/2015 09:19   Ct Angio Chest Pe W/cm &/or Wo Cm  05/11/2015  CLINICAL DATA:  Shortness of breath, back pain. EXAM: CT ANGIOGRAPHY CHEST WITH CONTRAST TECHNIQUE: Multidetector CT imaging of the chest was performed using the standard protocol during bolus administration of intravenous contrast. Multiplanar CT image reconstructions and MIPs were obtained to evaluate the vascular anatomy. CONTRAST:  55mL OMNIPAQUE IOHEXOL 350 MG/ML SOLN COMPARISON:  Radiograph of same day. FINDINGS: Anterior osteophyte formation is noted in lower thoracic spine. No pneumothorax or significant pleural effusion is noted. Mild bibasilar subsegmental atelectasis is noted. Substantial amount of respiratory motion artifact is noted, limiting evaluation of the lower row lobe branches. There does appear to be a defect involving the upper lobe branch of the right pulmonary artery concerning for pulmonary embolus. There is no evidence of thoracic aortic dissection or aneurysm. No mediastinal mass or adenopathy is noted. Within the visualized portion of upper abdomen, large solitary gallstone is noted. Review of the MIP images confirms the above findings. IMPRESSION: Filling defect seen in upper lobe branch of the right pulmonary artery concerning for acute pulmonary embolus. Critical Value/emergent results were called by telephone at the time of interpretation on 05/11/2015 at 4:38 pm to Dr. Hillary Bow , who verbally acknowledged these results. Mild bibasilar subsegmental atelectasis is noted. Large solitary gallstone is noted in visualized portion of upper abdomen. Electronically Signed   By: Marijo Conception, M.D.   On: 05/11/2015 16:39   US Venous Img Lower Bilateral  05/12/2015  CLINICAL DATA:  70 year old  male with a history of pulmonary embolism EXAM: BILATERAL LOWER EXTREMITY VENOUS DOPPLER ULTRASOUND TECHNIQUE: Gray-scale sonography with graded compression, as well as color Doppler and duplex ultrasound were performed to evaluate the lower extremity deep venous systems from the level of the common femoral vein and including the common femoral, femoral, profunda femoral, popliteal and calf veins including the posterior tibial, peroneal and gastrocnemius veins when visible. The superficial great saphenous vein was also interrogated. Spectral Doppler was utilized to evaluate flow at rest and with distal augmentation maneuvers in the common femoral, femoral and popliteal veins. COMPARISON:  None. FINDINGS: RIGHT LOWER EXTREMITY Common Femoral Vein: No evidence of thrombus. Normal compressibility, respiratory phasicity and response to augmentation. Saphenofemoral Junction: No evidence of thrombus. Normal compressibility and flow on color Doppler imaging. Profunda Femoral Vein: No evidence of thrombus. Normal compressibility and flow on color Doppler imaging. Femoral Vein: No evidence of thrombus. Normal compressibility, respiratory phasicity and response to augmentation. Popliteal Vein: No evidence of thrombus. Normal compressibility, respiratory phasicity and response to augmentation. Calf Veins: No evidence of thrombus. Normal compressibility and flow on color Doppler imaging. Superficial Great Saphenous Vein: No evidence of thrombus. Normal compressibility and flow on color Doppler imaging. Other Findings:  None. LEFT LOWER EXTREMITY Common Femoral Vein: No evidence of thrombus. Normal compressibility, respiratory phasicity and response to augmentation. Saphenofemoral Junction: No evidence of thrombus. Normal compressibility and flow on color Doppler imaging. Profunda Femoral Vein: No evidence of thrombus. Normal compressibility and flow on color Doppler imaging. Femoral Vein: No evidence of thrombus. Normal  compressibility, respiratory phasicity and response to augmentation. Popliteal Vein: No evidence of thrombus. Normal compressibility, respiratory phasicity and response to augmentation. Calf Veins: No evidence of thrombus. Normal compressibility and flow on color Doppler imaging. Superficial Great Saphenous Vein: No evidence of thrombus. Normal compressibility and flow on color Doppler imaging. Other Findings:  None. IMPRESSION: Sonographic survey of the bilateral lower extremities negative for DVT. Signed, Dulcy Fanny. Earleen Newport, DO [facility] Electronically Signed   By: Corrie Mckusick D.O.   On: 05/12/2015 13:57    Assessment/Plan: Assessment:  Casey Sadlowski is a 70 y.o. male admitted with LBP and found to have fevers, strep bacteremia MRI neg for osteomyelitis. Had recent dental work. Echo neg.  [place] with strep mutans - PCN MIC not released- will need to discuss with Micro Repeat bcx neg from 2/15  Recommendations - will need TEE  Continue ceftriaxone 2 gm q 24  Thank you very much for the consult. Will follow with you.  [place], [place]   05/12/2015, 8:42 PM

## 2015-05-12 NOTE — Progress Notes (Signed)
Patients HR sustaining in 130s. Patient up to chair with assist, but that was 20 minutes ago and HR not resolved. Dr. Darvin Neighbours paged and made aware. PRN orders with parameters received.

## 2015-05-12 NOTE — Progress Notes (Signed)
Patient was sitting in chair and reported having bad back pain while sitting up. Patient repositioned in bed and HR has resolved and resting in 100s, currently at 105. Once in bed, patient reported great relief from pain and declined offer of pain medication. PRN metoprolol not given at this time, will continue to monitor.

## 2015-05-12 NOTE — Progress Notes (Signed)
[facility] NAME: Eric Glover    MR#:  [id number]  DATE OF BIRTH:  1945/10/18  SUBJECTIVE:   Patient here due to intractable back pain also noted to be in acute on chronic renal failure. Patient had an epidural injection but says his back pain is not much improved.  Continue to be in Afib but rate controlled.  Up and ambulating some with walker.  REVIEW OF SYSTEMS:    Review of Systems  Constitutional: Negative for fever and chills.  HENT: Negative for congestion and tinnitus.   Eyes: Negative for blurred vision and double vision.  Respiratory: Positive for cough, shortness of breath and wheezing.   Cardiovascular: Negative for chest pain, orthopnea and PND.  Gastrointestinal: Negative for nausea, vomiting, abdominal pain and diarrhea.  Genitourinary: Negative for dysuria and hematuria.  Musculoskeletal: Positive for back pain.  Neurological: Positive for weakness (Generalized. ). Negative for dizziness, sensory change and focal weakness.  All other systems reviewed and are negative.   Nutrition: Heart healthy/Carb control Tolerating Diet: yes Tolerating PT:  Eval noted.   DRUG ALLERGIES:   Allergies  Allergen Reactions  . Neomycin-Bacitracin Zn-Polymyx Rash    VITALS:  Blood pressure 127/70, pulse 107, temperature 98.4 F (36.9 C), temperature source Oral, resp. rate 18, height 6\' 2"  (1.88 m), weight 107.049 kg (236 lb), SpO2 95 %.  PHYSICAL EXAMINATION:   Physical Exam  GENERAL:  70 y.o.-year-old patient lying in the bed in no acute distress.  EYES: Pupils equal, round, reactive to light and accommodation. No scleral icterus. Extraocular muscles intact.  HEENT: Head atraumatic, normocephalic. Oropharynx and nasopharynx clear. Moist oral mucosa NECK:  Supple, no jugular venous distention. No thyroid enlargement, no tenderness.  LUNGS: Increased work of breathing. Occasional wheeze. CARDIOVASCULAR:  S1, S2 irregular, tachycardia ABDOMEN: Soft, nontender, nondistended. Bowel sounds present. No organomegaly or mass.  EXTREMITIES: No cyanosis, clubbing or edema b/l.    NEUROLOGIC: Cranial nerves II through XII are intact. No focal Motor or sensory deficits b/l. Globally weak due to back pain. PSYCHIATRIC: The patient is alert and oriented x 3.  SKIN: No obvious rash, lesion, or ulcer.    LABORATORY PANEL:   CBC  Recent Labs Lab 05/11/15 0500  WBC 9.2  HGB 11.1*  HCT 33.4*  PLT 247   ------------------------------------------------------------------------------------------------------------------  Chemistries   Recent Labs Lab 05/06/15 1540  05/11/15 0500  NA 131*  < > 134*  K 5.5*  < > 4.5  CL 105  < > 108  CO2 18*  < > 21*  GLUCOSE 331*  < > 267*  BUN 57*  < > 38*  CREATININE 2.16*  < > 1.39*  CALCIUM 8.7*  < > 8.0*  AST 28  --   --   ALT 22  --   --   ALKPHOS 67  --   --   BILITOT 1.1  --   --   < > = values in this interval not displayed. ------------------------------------------------------------------------------------------------------------------  Cardiac Enzymes  Recent Labs Lab 05/12/15 0337  TROPONINI 0.05*   ------------------------------------------------------------------------------------------------------------------  RADIOLOGY:  Dg Chest 2 View  05/11/2015  CLINICAL DATA:  Short of breath EXAM: CHEST  2 VIEW COMPARISON:  05/08/2015 FINDINGS: Negative for heart failure or pneumonia. Mild atelectasis in the lung bases. No pleural effusion. IMPRESSION: Mild bibasilar atelectasis. Electronically Signed   By: Franchot Gallo M.D.   On: 05/11/2015 09:19   Ct Angio Chest Pe W/cm &/  or Wo Cm  05/11/2015  CLINICAL DATA:  Shortness of breath, back pain. EXAM: CT ANGIOGRAPHY CHEST WITH CONTRAST TECHNIQUE: Multidetector CT imaging of the chest was performed using the standard protocol during bolus administration of intravenous contrast. Multiplanar CT  image reconstructions and MIPs were obtained to evaluate the vascular anatomy. CONTRAST:  36mL OMNIPAQUE IOHEXOL 350 MG/ML SOLN COMPARISON:  Radiograph of same day. FINDINGS: Anterior osteophyte formation is noted in lower thoracic spine. No pneumothorax or significant pleural effusion is noted. Mild bibasilar subsegmental atelectasis is noted. Substantial amount of respiratory motion artifact is noted, limiting evaluation of the lower row lobe branches. There does appear to be a defect involving the upper lobe branch of the right pulmonary artery concerning for pulmonary embolus. There is no evidence of thoracic aortic dissection or aneurysm. No mediastinal mass or adenopathy is noted. Within the visualized portion of upper abdomen, large solitary gallstone is noted. Review of the MIP images confirms the above findings. IMPRESSION: Filling defect seen in upper lobe branch of the right pulmonary artery concerning for acute pulmonary embolus. Critical Value/emergent results were called by telephone at the time of interpretation on 05/11/2015 at 4:38 pm to Dr. Hillary Bow , who verbally acknowledged these results. Mild bibasilar subsegmental atelectasis is noted. Large solitary gallstone is noted in visualized portion of upper abdomen. Electronically Signed   By: Marijo Conception, M.D.   On: 05/11/2015 16:39     ASSESSMENT AND PLAN:   70 year old male with past medical history of diabetes type 2 without complication, GERD, hypertension, hyperlipidemia, chronic kidney disease stage III, gout who presents to the hospital with back pain and noted to be in acute on chronic renal failure.  * Right upper lobe pulmonary embolism Start on therapeutic Lovenox. This can be stopped if patient needs TEE or other procedures. Patient can be started on NOAC prior to discharge. Check lower extremity Dopplers  # Afib with RVR - Likely from sepsis and PE Started on Cardizem 60 mg every 6 hours. Now rate  controlled. Appreciate cardiology input.  # Intractable back pain-patient's x-ray on admission showed some mild DJD.  - CT scan of the lumbar spine showed significant disc bulge at L5-S1 level along with central canal stenosis and some neural foramen narrowing and nerve impingement. -Status post epidural injection done by anesthesia with no improvement in pain - Fentanyl patch, continue as needed Dilaudid, Oxycodone and monitor.   - MRI to rule out discitis which is negative. -- Continue PT as tolerated but patient refuses skilled nursing facility.  # sepsis due to streptococcus bacteremia-patient spiked a fever of 103 two days ago and yesterday a low grade fever.  -His blood cultures positive for Streptococcus but not identified yet. -MRI of the lumbar spine negative for any acute discitis. Appreciate infectious disease input. Echo- No endocarditis. Repeat blood cultures have been ordered to make sure they're clearing. -Continue ceftriaxone for now. Will need TEE if viridans Strep  # Acute on chronic renal failure-Likely due to dehydration.  -Improved and back to baseline with IV fluids.  # Hyperkalemia mild and due to acute on chronic renal failure.  - resolved now.   # DM Type II w/out complication - BS a bit elevated due to steroids.  -taper the patient's steroids. - cont. Levemir, Novolog with meals and cont.  SSI  # BPH - cont. Doxazosin.    # Hyperlipidemia - cont. Hyperlipidemia.   Pt. Belongs to the [place] and paperwork has been faxed over by  CM. They are on Diversion presently.   All the records are reviewed and case discussed with Care Management/Social Workerr. Management plans discussed with the patient, family and they are in agreement.  CODE STATUS: Full  DVT Prophylaxis: SCD's.   TOTAL CRITICAL CARE TIME TAKING CARE OF THIS PATIENT: 35 minutes.   Hillary Bow R M.D on 05/12/2015 at 1:51 PM  Between 7am to 6pm - Pager - [phone]  After 6pm go to  [email or web address] - password [email or web address] [facility]  Office  [phone]  CC: Primary care physician; Ashok Norris, MD

## 2015-05-12 NOTE — Progress Notes (Signed)
PT Cancellation Note  Patient Details Name: Eric Glover MRN: [id number] DOB: 1945/06/28   Cancelled Treatment:    Reason Eval/Treat Not Completed: Other (comment). Pt now with positive PE and started on anticoag dosing at 1800 on 05/11/15. Will hold treatment at this time for approx 48 hours per protocol.    Wynonia Medero 05/12/2015, 2:13 PM  Greggory Stallion, PT, DPT [phone]

## 2015-05-13 ENCOUNTER — Inpatient Hospital Stay: Payer: Non-veteran care

## 2015-05-13 LAB — BASIC METABOLIC PANEL
Anion gap: 7 (ref 5–15)
BUN: 29 mg/dL — AB (ref 6–20)
CHLORIDE: 106 mmol/L (ref 101–111)
CO2: 24 mmol/L (ref 22–32)
Calcium: 8.1 mg/dL — ABNORMAL LOW (ref 8.9–10.3)
Creatinine, Ser: 1.24 mg/dL (ref 0.61–1.24)
GFR, EST NON AFRICAN AMERICAN: 58 mL/min — AB (ref >60)
Glucose, Bld: 172 mg/dL — ABNORMAL HIGH (ref 65–99)
Potassium: 4.4 mmol/L (ref 3.5–5.1)
SODIUM: 137 mmol/L (ref 135–145)

## 2015-05-13 LAB — GLUCOSE, CAPILLARY
GLUCOSE-CAPILLARY: 154 mg/dL — AB (ref 65–99)
GLUCOSE-CAPILLARY: 115 mg/dL — AB (ref 65–99)
Glucose-Capillary: 156 mg/dL — ABNORMAL HIGH (ref 65–99)
Glucose-Capillary: 259 mg/dL — ABNORMAL HIGH (ref 65–99)

## 2015-05-13 MED ORDER — OXYCODONE HCL 5 MG PO TABS
10.0000 mg | ORAL_TABLET | ORAL | Status: DC | PRN
  Administered 2015-05-13 – 2015-05-16 (×9): 10 mg via ORAL
  Filled 2015-05-13 (×3): qty 2
  Filled 2015-05-13: qty 1
  Filled 2015-05-13 (×2): qty 2
  Filled 2015-05-13: qty 1
  Filled 2015-05-13 (×3): qty 2

## 2015-05-13 MED ORDER — METOPROLOL TARTRATE 25 MG PO TABS
25.0000 mg | ORAL_TABLET | Freq: Four times a day (QID) | ORAL | Status: DC
  Administered 2015-05-13: 25 mg via ORAL
  Filled 2015-05-13: qty 1

## 2015-05-13 MED ORDER — AMIODARONE LOAD VIA INFUSION
150.0000 mg | Freq: Once | INTRAVENOUS | Status: AC
  Administered 2015-05-13: 150 mg via INTRAVENOUS
  Filled 2015-05-13: qty 83.34

## 2015-05-13 MED ORDER — QUETIAPINE FUMARATE 25 MG PO TABS
25.0000 mg | ORAL_TABLET | Freq: Every day | ORAL | Status: DC
  Administered 2015-05-13 – 2015-05-16 (×4): 25 mg via ORAL
  Filled 2015-05-13 (×4): qty 1

## 2015-05-13 MED ORDER — METOPROLOL TARTRATE 25 MG PO TABS
25.0000 mg | ORAL_TABLET | Freq: Two times a day (BID) | ORAL | Status: DC
  Administered 2015-05-13 – 2015-05-17 (×8): 25 mg via ORAL
  Filled 2015-05-13 (×8): qty 1

## 2015-05-13 MED ORDER — METOPROLOL TARTRATE 1 MG/ML IV SOLN
5.0000 mg | INTRAVENOUS | Status: AC
  Administered 2015-05-13: 5 mg via INTRAVENOUS
  Filled 2015-05-13: qty 5

## 2015-05-13 MED ORDER — METOPROLOL TARTRATE 1 MG/ML IV SOLN: 5.0000 mg | INTRAVENOUS | Status: DC | PRN

## 2015-05-13 MED ORDER — AMIODARONE HCL IN DEXTROSE 360-4.14 MG/200ML-% IV SOLN
30.0000 mg/h | INTRAVENOUS | Status: DC
  Administered 2015-05-13 – 2015-05-15 (×4): 30 mg/h via INTRAVENOUS
  Filled 2015-05-13 (×6): qty 200

## 2015-05-13 MED ORDER — AMIODARONE HCL IN DEXTROSE 360-4.14 MG/200ML-% IV SOLN
60.0000 mg/h | INTRAVENOUS | Status: DC
  Administered 2015-05-13 (×2): 60 mg/h via INTRAVENOUS
  Filled 2015-05-13 (×3): qty 200

## 2015-05-13 NOTE — Progress Notes (Signed)
PT Cancellation Note  Patient Details Name: Eric Glover MRN: [id number] DOB: 1945-11-25   Cancelled Treatment:    Reason Eval/Treat Not Completed: Pain limiting ability to participate.  Attempted session x 2.    Chesley Noon, PTA 05/13/2015, 10:16 AM

## 2015-05-13 NOTE — Care Management Note (Signed)
Case Management Note  Patient Details  Name: Crayton Martina MRN: [id number] Date of Birth: [birth year]  Subjective/Objective:     All information requested by the [facility] is in a packet in Mr Botz's chart. A Medical Necessity form is stapled to the front of the packet with a note that a completed EMTALA must accompany this packet to the [facility] if Mr Salo is accepted by the [place] and they call with an available bed.               Action/Plan:   Expected Discharge Date:                  Expected Discharge Plan:     In-House Referral:     Discharge planning Services     Post Acute Care Choice:    Choice offered to:     DME Arranged:    DME Agency:     HH Arranged:    [place] Agency:     Status of Service:     Medicare Important Message Given:    Date Medicare IM Given:    Medicare IM give by:    Date Additional Medicare IM Given:    Additional Medicare Important Message give by:     If discussed at [place] of Stay Meetings, dates discussed:    Additional Comments:  Tanice Petre A, RN 05/13/2015, 3:38 PM

## 2015-05-13 NOTE — Progress Notes (Signed)
Patient ID: Eric Glover, male   DOB: [birth year], 70 y.o.   MRN: [id number] [facility] PROGRESS NOTE  Eric Glover [id number] DOB: [birth year] DOA: 05/06/2015 PCP: Eric Norris, MD  HPI/Subjective: Patient seen earlier and again now.  Called back by nurse that HR went as high as 200.  Patient became more confused, spike fever, Earlier he was complaining of severe back pain  Objective: Filed Vitals:   05/13/15 1213 05/13/15 1232  BP: 140/68   Pulse: 181   Temp: 98.6 F (37 C) 100.6 F (38.1 C)  Resp: 24     Filed Weights   05/06/15 1508 05/06/15 2349 05/09/15 1259  Weight: 104.327 kg (230 lb) 102.785 kg (226 lb 9.6 oz) 107.049 kg (236 lb)    ROS: Review of Systems  Constitutional: Positive for fever. Negative for chills.  Eyes: Negative for blurred vision.  Respiratory: Negative for cough and shortness of breath.   Cardiovascular: Negative for chest pain.  Gastrointestinal: Negative for nausea, vomiting, abdominal pain, diarrhea and constipation.  Genitourinary: Negative for dysuria.  Musculoskeletal: Positive for back pain. Negative for joint pain.  Neurological: Negative for dizziness and headaches.   Exam: Physical Exam  HENT:  Nose: No mucosal edema.  Mouth/Throat: No oropharyngeal exudate or posterior oropharyngeal edema.  Eyes: Conjunctivae, EOM and lids are normal. Pupils are equal, round, and reactive to light.  Neck: No JVD present. Carotid bruit is not present. No edema present. No thyroid mass and no thyromegaly present.  Cardiovascular: S1 normal and S2 normal.  An irregularly irregular rhythm present. Tachycardia present.  Exam reveals no gallop.   Murmur heard.  Systolic murmur is present with a grade of 2/6  Pulses:      Dorsalis pedis pulses are 1+ on the right side, and 1+ on the left side.  Respiratory: No respiratory distress. He has no wheezes. He has no rhonchi. He has no rales.  GI: Soft. Bowel sounds are normal. There  is no tenderness.  Musculoskeletal:       Right ankle: He exhibits swelling.       Left ankle: He exhibits swelling.  Lymphadenopathy:    He has no cervical adenopathy.  Neurological: He is alert.  earlier patient was able to straight leg raise.  Right leg more difficult than left. Now more confusion  Skin: Skin is warm. No rash noted. Nails show no clubbing.  Psychiatric: He is agitated.      Data Reviewed: Basic Metabolic Panel:  Recent Labs Lab 05/08/15 0932 05/09/15 0545 05/10/15 0503 05/11/15 0500 05/13/15 0714  NA 136 131* 135 134* 137  K 4.1 4.2 4.7 4.5 4.4  CL 111 105 110 108 106  CO2 19* 18* 19* 21* 24  GLUCOSE 252* 159* 271* 267* 172*  BUN 46* 47* 43* 38* 29*  CREATININE 2.07* 1.71* 1.45* 1.39* 1.24  CALCIUM 8.6* 8.0* 8.1* 8.0* 8.1*   Liver Function Tests:  Recent Labs Lab 05/06/15 1540  AST 28  ALT 22  ALKPHOS 67  BILITOT 1.1  PROT 7.1  ALBUMIN 3.8   No results for input(s): LIPASE, AMYLASE in the last 168 hours. No results for input(s): AMMONIA in the last 168 hours. CBC:  Recent Labs Lab 05/06/15 1540 05/07/15 0456 05/08/15 0932 05/09/15 0545 05/10/15 0503 05/11/15 0500  WBC 10.7* 11.1* 15.5* 8.8 9.6 9.2  NEUTROABS 8.9*  --   --   --   --   --   HGB 11.9* 10.8* 10.6* 10.7* 10.9* 11.1*  HCT  35.9* 33.1* 32.1* 32.0* 32.8* 33.4*  MCV 82.5 81.8 81.2 81.9 81.7 82.3  PLT 296 282 288 277 260 247   Cardiac Enzymes:  Recent Labs Lab 05/11/15 1532 05/11/15 2107 05/12/15 0337  TROPONINI 0.07* 0.06* 0.05*   BNP (last 3 results) No results for input(s): BNP in the last 8760 hours.  ProBNP (last 3 results) No results for input(s): PROBNP in the last 8760 hours.  CBG:  Recent Labs Lab 05/12/15 1224 05/12/15 1707 05/12/15 2120 05/13/15 0730 05/13/15 1122  GLUCAP 211* 278* 290* 154* 115*    Recent Results (from the past 240 hour(s))  CULTURE, BLOOD (ROUTINE X 2) w Reflex to PCR ID Panel     Status: None   Collection Time:  05/08/15  9:34 AM  Result Value Ref Range Status   Specimen Description BLOOD LEFT ARM  Final   Special Requests   Final    BOTTLES DRAWN AEROBIC AND ANAEROBIC 7ML ARE 5ML ANA   Culture  Setup Time   Final    GRAM POSITIVE COCCI IN BOTH AEROBIC AND ANAEROBIC BOTTLES CRITICAL RESULT CALLED TO, READ BACK BY AND VERIFIED WITH: [place] ON 2/14 17 MLZ    Culture   Final    STREPTOCOCCUS MUTANS IN BOTH AEROBIC AND ANAEROBIC BOTTLES    Report Status 05/12/2015 FINAL  Final   Organism ID, Bacteria STREPTOCOCCUS MUTANS  Final      Susceptibility   Streptococcus mutans - MIC*    ERYTHROMYCIN Value in next row Sensitive      SENSITIVE<=0.12    LEVOFLOXACIN Value in next row Sensitive      SENSITIVE2    VANCOMYCIN Value in next row Sensitive      SENSITIVE1    * STREPTOCOCCUS MUTANS  CULTURE, BLOOD (ROUTINE X 2) w Reflex to PCR ID Panel     Status: None   Collection Time: 05/08/15  9:34 AM  Result Value Ref Range Status   Specimen Description BLOOD RT ARM  Final   Special Requests   Final    BOTTLES DRAWN AEROBIC AND ANAEROBIC 6ML ANA 9ML AER   Culture  Setup Time   Final    GRAM POSITIVE COCCI IN BOTH AEROBIC AND ANAEROBIC BOTTLES CRITICAL RESULT CALLED TO, READ BACK BY AND VERIFIED WITH: Eric Glover AT 1949 ON 05/09/15 MLZ    Culture   Final    STREPTOCOCCUS MUTANS IN BOTH AEROBIC AND ANAEROBIC BOTTLES    Report Status 05/12/2015 FINAL  Final   Organism ID, Bacteria STREPTOCOCCUS MUTANS  Final      Susceptibility   Streptococcus mutans - MIC*    ERYTHROMYCIN Value in next row Sensitive      SENSITIVE<=0.12    LEVOFLOXACIN Value in next row Sensitive      SENSITIVE2    VANCOMYCIN Value in next row Sensitive      SENSITIVE1    * STREPTOCOCCUS MUTANS  Blood Culture ID Panel (Reflexed)     Status: Abnormal   Collection Time: 05/08/15  9:34 AM  Result Value Ref Range Status   Enterococcus species NOT DETECTED NOT DETECTED Final   Listeria monocytogenes NOT  DETECTED NOT DETECTED Final   Staphylococcus species NOT DETECTED NOT DETECTED Final   Staphylococcus aureus NOT DETECTED NOT DETECTED Final   Streptococcus species DETECTED (A) NOT DETECTED Final    Comment: CRITICAL RESULT CALLED TO, READ BACK BY AND VERIFIED WITH: CALLED TO Eric Glover AT 1949 ON 2/14 17 MLZ  Streptococcus agalactiae NOT DETECTED NOT DETECTED Final   Streptococcus pneumoniae NOT DETECTED NOT DETECTED Final   Streptococcus pyogenes NOT DETECTED NOT DETECTED Final   Acinetobacter baumannii NOT DETECTED NOT DETECTED Final   Enterobacteriaceae species NOT DETECTED NOT DETECTED Final   Enterobacter cloacae complex NOT DETECTED NOT DETECTED Final   Escherichia coli NOT DETECTED NOT DETECTED Final   Klebsiella oxytoca NOT DETECTED NOT DETECTED Final   Klebsiella pneumoniae NOT DETECTED NOT DETECTED Final   Proteus species NOT DETECTED NOT DETECTED Final   Serratia marcescens NOT DETECTED NOT DETECTED Final   Haemophilus influenzae NOT DETECTED NOT DETECTED Final   Neisseria meningitidis NOT DETECTED NOT DETECTED Final   Pseudomonas aeruginosa NOT DETECTED NOT DETECTED Final   Candida albicans NOT DETECTED NOT DETECTED Final   Candida glabrata NOT DETECTED NOT DETECTED Final   Candida krusei NOT DETECTED NOT DETECTED Final   Candida parapsilosis NOT DETECTED NOT DETECTED Final   Candida tropicalis NOT DETECTED NOT DETECTED Final   Carbapenem resistance NOT DETECTED NOT DETECTED Final   Methicillin resistance NOT DETECTED NOT DETECTED Final   Vancomycin resistance NOT DETECTED NOT DETECTED Final  Culture, blood (Routine X 2) w Reflex to ID Panel     Status: None (Preliminary result)   Collection Time: 05/10/15  3:33 PM  Result Value Ref Range Status   Specimen Description BLOOD RIGHT AC  Final   Special Requests BOTTLES DRAWN AEROBIC AND ANAEROBIC 4ML  Final   Culture NO GROWTH 3 DAYS  Final   Report Status PENDING  Incomplete  Culture, blood (Routine X 2) w Reflex  to ID Panel     Status: None (Preliminary result)   Collection Time: 05/10/15  3:33 PM  Result Value Ref Range Status   Specimen Description BLOOD LEFT AC  Final   Special Requests   Final    BOTTLES DRAWN AEROBIC AND ANAEROBIC AER 10ML ANA 4ML   Culture NO GROWTH 3 DAYS  Final   Report Status PENDING  Incomplete     Studies: Ct Angio Chest Pe W/cm &/or Wo Cm  05/11/2015  CLINICAL DATA:  Shortness of breath, back pain. EXAM: CT ANGIOGRAPHY CHEST WITH CONTRAST TECHNIQUE: Multidetector CT imaging of the chest was performed using the standard protocol during bolus administration of intravenous contrast. Multiplanar CT image reconstructions and MIPs were obtained to evaluate the vascular anatomy. CONTRAST:  32mL OMNIPAQUE IOHEXOL 350 MG/ML SOLN COMPARISON:  Radiograph of same day. FINDINGS: Anterior osteophyte formation is noted in lower thoracic spine. No pneumothorax or significant pleural effusion is noted. Mild bibasilar subsegmental atelectasis is noted. Substantial amount of respiratory motion artifact is noted, limiting evaluation of the lower row lobe branches. There does appear to be a defect involving the upper lobe branch of the right pulmonary artery concerning for pulmonary embolus. There is no evidence of thoracic aortic dissection or aneurysm. No mediastinal mass or adenopathy is noted. Within the visualized portion of upper abdomen, large solitary gallstone is noted. Review of the MIP images confirms the above findings. IMPRESSION: Filling defect seen in upper lobe branch of the right pulmonary artery concerning for acute pulmonary embolus. Critical Value/emergent results were called by telephone at the time of interpretation on 05/11/2015 at 4:38 pm to Dr. Hillary Bow , who verbally acknowledged these results. Mild bibasilar subsegmental atelectasis is noted. Large solitary gallstone is noted in visualized portion of upper abdomen. Electronically Signed   By: Marijo Conception, M.D.   On:  05/11/2015 16:39  US Venous Img Lower Bilateral  05/12/2015  CLINICAL DATA:  70 year old male with a history of pulmonary embolism EXAM: BILATERAL LOWER EXTREMITY VENOUS DOPPLER ULTRASOUND TECHNIQUE: Gray-scale sonography with graded compression, as well as color Doppler and duplex ultrasound were performed to evaluate the lower extremity deep venous systems from the level of the common femoral vein and including the common femoral, femoral, profunda femoral, popliteal and calf veins including the posterior tibial, peroneal and gastrocnemius veins when visible. The superficial great saphenous vein was also interrogated. Spectral Doppler was utilized to evaluate flow at rest and with distal augmentation maneuvers in the common femoral, femoral and popliteal veins. COMPARISON:  None. FINDINGS: RIGHT LOWER EXTREMITY Common Femoral Vein: No evidence of thrombus. Normal compressibility, respiratory phasicity and response to augmentation. Saphenofemoral Junction: No evidence of thrombus. Normal compressibility and flow on color Doppler imaging. Profunda Femoral Vein: No evidence of thrombus. Normal compressibility and flow on color Doppler imaging. Femoral Vein: No evidence of thrombus. Normal compressibility, respiratory phasicity and response to augmentation. Popliteal Vein: No evidence of thrombus. Normal compressibility, respiratory phasicity and response to augmentation. Calf Veins: No evidence of thrombus. Normal compressibility and flow on color Doppler imaging. Superficial Great Saphenous Vein: No evidence of thrombus. Normal compressibility and flow on color Doppler imaging. Other Findings:  None. LEFT LOWER EXTREMITY Common Femoral Vein: No evidence of thrombus. Normal compressibility, respiratory phasicity and response to augmentation. Saphenofemoral Junction: No evidence of thrombus. Normal compressibility and flow on color Doppler imaging. Profunda Femoral Vein: No evidence of thrombus. Normal  compressibility and flow on color Doppler imaging. Femoral Vein: No evidence of thrombus. Normal compressibility, respiratory phasicity and response to augmentation. Popliteal Vein: No evidence of thrombus. Normal compressibility, respiratory phasicity and response to augmentation. Calf Veins: No evidence of thrombus. Normal compressibility and flow on color Doppler imaging. Superficial Great Saphenous Vein: No evidence of thrombus. Normal compressibility and flow on color Doppler imaging. Other Findings:  None. IMPRESSION: Sonographic survey of the bilateral lower extremities negative for DVT. Signed, Dulcy Fanny. Earleen Newport, DO [facility] Electronically Signed   By: Corrie Mckusick D.O.   On: 05/12/2015 13:57    Scheduled Meds: . amiodarone  150 mg Intravenous Once  . aspirin EC  81 mg Oral Daily  . atorvastatin  40 mg Oral q1800  . budesonide (PULMICORT) nebulizer solution  0.25 mg Nebulization BID  . cefTRIAXone (ROCEPHIN)  IV  2 g Intravenous Q24H  . diltiazem  60 mg Oral 4 times per day  . docusate sodium  100 mg Oral BID  . doxazosin  8 mg Oral Daily  . enoxaparin (LOVENOX) injection  1 mg/kg Subcutaneous Q12H  . gabapentin  100 mg Oral QHS  . insulin aspart  0-15 Units Subcutaneous TID AC & HS  . insulin aspart  4 Units Subcutaneous TID WC  . insulin detemir  40 Units Subcutaneous QHS  . metoprolol tartrate  25 mg Oral Q6H  . pantoprazole  40 mg Oral Daily   Continuous Infusions: . amiodarone     Followed by  . amiodarone      Assessment/Plan:  1. Atrial fibrillation with rapid ventricular response. No response to 2 pushes of IV metoprolol 5 mg. Start oral metoprolol. Continue oral Cardizem. Start amiodarone IV. Case discussed with Dr. Clayborn Bigness cardiology to see the patient in consultation. 2. Acute encephalopathy- could be with pain medications and steroids. Stop the fentanyl patch and I bloated. Continue oral pain medications as  needed. Could  also be fever related. Recheck blood cultures. 3. Streptococcus mutans sepsis. A TEE will need to be done to rule out endocarditis. Patient on IV Rocephin. Recheck blood cultures today with fever. 4. Pulmonary embolism seen on CT scan- patient on high-dose Lovenox 5. Low back pain with right-sided sciatica. Taper steroids off quicker with altered mental status. Stop gabapentin. 6. Hyperlipidemia unspecified on statin 7. Type 2 diabetes on Levemir and sliding scale 8. Acute on chronic kidney disease- continue to monitor 9. Essential hypertension blood pressure currently stable. 10. Acute respiratory failure- patient now requiring oxygen  Code Status:     Code Status Orders        Start     Ordered   05/06/15 2348  Full code   Continuous     05/06/15 2347    Code Status History    Date Active Date Inactive Code Status Order ID Comments User Context   This patient has a current code status but no historical code status.     Family Communication: Spoke with wife on the phone Disposition Plan: To be determined  Consultants:  Infectious disease  Cardiology  Antibiotics:  Rocephin  Time spent: 35 minutes critical care time  Loletha Grayer  [facility]

## 2015-05-13 NOTE — Progress Notes (Signed)
Notified MD. Per Dr. Earleen Newport hold insulin for now because patient is not eating. Per MD give PO Cardizem and PO metoprolol.

## 2015-05-13 NOTE — Progress Notes (Signed)
[facility] called with Bed. Family denied. Refusal document faxed to [place]. Copy in chart

## 2015-05-13 NOTE — Progress Notes (Signed)
Pt hr remains elevated after administration of metoprolol 5mg  IV twice. Dr.Wieting approved second dose. Dr. Leslye Peer currently on unit to evaluate patient.

## 2015-05-13 NOTE — Plan of Care (Signed)
Problem: Pain Managment: Goal: General experience of comfort will improve Outcome: Progressing Pain increases with movement.  Problem: Activity: Goal: Risk for activity intolerance will decrease Pt has an unsteady gait, will need a physical therapy evaluation.

## 2015-05-14 LAB — GLUCOSE, CAPILLARY
GLUCOSE-CAPILLARY: 227 mg/dL — AB (ref 65–99)
Glucose-Capillary: 233 mg/dL — ABNORMAL HIGH (ref 65–99)
Glucose-Capillary: 172 mg/dL — ABNORMAL HIGH (ref 65–99)
Glucose-Capillary: 242 mg/dL — ABNORMAL HIGH (ref 65–99)

## 2015-05-14 MED ORDER — NYSTATIN 100000 UNIT/ML MT SUSP
5.0000 mL | Freq: Four times a day (QID) | OROMUCOSAL | Status: DC
  Administered 2015-05-14 – 2015-05-17 (×12): 500000 [IU] via ORAL
  Filled 2015-05-14 (×16): qty 5

## 2015-05-14 MED ORDER — APIXABAN 5 MG PO TABS
10.0000 mg | ORAL_TABLET | Freq: Two times a day (BID) | ORAL | Status: DC
Start: 2015-05-14 — End: 2015-05-17
  Administered 2015-05-14 – 2015-05-17 (×6): 10 mg via ORAL
  Filled 2015-05-14 (×6): qty 2

## 2015-05-14 NOTE — Progress Notes (Signed)
Patient ID: Eric Glover, male   DOB: 1945/10/26, 70 y.o.   MRN: [id number]  [facility] PROGRESS NOTE  Eric Glover [id number] DOB: 01-28-1946 DOA: 05/06/2015 PCP: Ashok Norris, MD  HPI/Subjective: Patient seen earlier. He was doing well at that time. He slept well. He was answering all questions appropriately. Breathing better than yesterday. Heart rate also better than yesterday. Back pain better controlled than yesterday.  Objective: Filed Vitals:   05/14/15 0403 05/14/15 1055  BP: 125/73 133/65  Pulse: 67 70  Temp: 98.3 F (36.8 C) 98.1 F (36.7 C)  Resp: 20 20    Filed Weights   05/06/15 1508 05/06/15 2349 05/09/15 1259  Weight: 104.327 kg (230 lb) 102.785 kg (226 lb 9.6 oz) 107.049 kg (236 lb)    ROS: Review of Systems  Constitutional: Negative for fever and chills.  Eyes: Negative for blurred vision.  Respiratory: Negative for cough and shortness of breath.   Cardiovascular: Negative for chest pain.  Gastrointestinal: Negative for nausea, vomiting, abdominal pain, diarrhea and constipation.  Genitourinary: Negative for dysuria.  Musculoskeletal: Positive for back pain. Negative for joint pain.  Neurological: Negative for dizziness and headaches.   Exam: Physical Exam  Constitutional: He is oriented to person, place, and time.  HENT:  Nose: No mucosal edema.  Mouth/Throat: No oropharyngeal exudate or posterior oropharyngeal edema.  Eyes: Conjunctivae, EOM and lids are normal. Pupils are equal, round, and reactive to light.  Neck: No JVD present. Carotid bruit is not present. No edema present. No thyroid mass and no thyromegaly present.  Cardiovascular: S1 normal and S2 normal.  An irregularly irregular rhythm present. Exam reveals no gallop.   Murmur heard.  Systolic murmur is present with a grade of 2/6  Pulses:      Dorsalis pedis pulses are 1+ on the right side, and 1+ on the left side.  Respiratory: No respiratory distress. He  has no wheezes. He has no rhonchi. He has no rales.  GI: Soft. Bowel sounds are normal. There is no tenderness.  Musculoskeletal:       Right ankle: He exhibits swelling.       Left ankle: He exhibits swelling.  Lymphadenopathy:    He has no cervical adenopathy.  Neurological: He is alert and oriented to person, place, and time.  Able to straight leg raise bilaterally.  Skin: Skin is warm. No rash noted. Nails show no clubbing.  Psychiatric: He has a normal mood and affect.      Data Reviewed: Basic Metabolic Panel:  Recent Labs Lab 05/08/15 0932 05/09/15 0545 05/10/15 0503 05/11/15 0500 05/13/15 0714  NA 136 131* 135 134* 137  K 4.1 4.2 4.7 4.5 4.4  CL 111 105 110 108 106  CO2 19* 18* 19* 21* 24  GLUCOSE 252* 159* 271* 267* 172*  BUN 46* 47* 43* 38* 29*  CREATININE 2.07* 1.71* 1.45* 1.39* 1.24  CALCIUM 8.6* 8.0* 8.1* 8.0* 8.1*    CBC:  Recent Labs Lab 05/08/15 0932 05/09/15 0545 05/10/15 0503 05/11/15 0500  WBC 15.5* 8.8 9.6 9.2  HGB 10.6* 10.7* 10.9* 11.1*  HCT 32.1* 32.0* 32.8* 33.4*  MCV 81.2 81.9 81.7 82.3  PLT 288 277 260 247   Cardiac Enzymes:  Recent Labs Lab 05/11/15 1532 05/11/15 2107 05/12/15 0337  TROPONINI 0.07* 0.06* 0.05*    CBG:  Recent Labs Lab 05/13/15 1122 05/13/15 1608 05/13/15 2143 05/14/15 0724 05/14/15 1055  GLUCAP 115* 156* 259* 233* 227*    Recent Results (from the  past 240 hour(s))  CULTURE, BLOOD (ROUTINE X 2) w Reflex to PCR ID Panel     Status: None   Collection Time: 05/08/15  9:34 AM  Result Value Ref Range Status   Specimen Description BLOOD LEFT ARM  Final   Special Requests   Final    BOTTLES DRAWN AEROBIC AND ANAEROBIC 7ML ARE 5ML ANA   Culture  Setup Time   Final    GRAM POSITIVE COCCI IN BOTH AEROBIC AND ANAEROBIC BOTTLES CRITICAL RESULT CALLED TO, READ BACK BY AND VERIFIED WITH: JASON ROBBINS AT 1949 ON 2/14 17 MLZ    Culture   Final    STREPTOCOCCUS MUTANS IN BOTH AEROBIC AND ANAEROBIC  BOTTLES    Report Status 05/12/2015 FINAL  Final   Organism ID, Bacteria STREPTOCOCCUS MUTANS  Final      Susceptibility   Streptococcus mutans - MIC*    ERYTHROMYCIN Value in next row Sensitive      SENSITIVE<=0.12    LEVOFLOXACIN Value in next row Sensitive      SENSITIVE2    VANCOMYCIN Value in next row Sensitive      SENSITIVE1    * STREPTOCOCCUS MUTANS  CULTURE, BLOOD (ROUTINE X 2) w Reflex to PCR ID Panel     Status: None   Collection Time: 05/08/15  9:34 AM  Result Value Ref Range Status   Specimen Description BLOOD RT ARM  Final   Special Requests   Final    BOTTLES DRAWN AEROBIC AND ANAEROBIC 6ML ANA 9ML AER   Culture  Setup Time   Final    GRAM POSITIVE COCCI IN BOTH AEROBIC AND ANAEROBIC BOTTLES CRITICAL RESULT CALLED TO, READ BACK BY AND VERIFIED WITH: JASON ROBBINS AT 1949 ON 05/09/15 MLZ    Culture   Final    STREPTOCOCCUS MUTANS IN BOTH AEROBIC AND ANAEROBIC BOTTLES    Report Status 05/12/2015 FINAL  Final   Organism ID, Bacteria STREPTOCOCCUS MUTANS  Final      Susceptibility   Streptococcus mutans - MIC*    ERYTHROMYCIN Value in next row Sensitive      SENSITIVE<=0.12    LEVOFLOXACIN Value in next row Sensitive      SENSITIVE2    VANCOMYCIN Value in next row Sensitive      SENSITIVE1    * STREPTOCOCCUS MUTANS  Blood Culture ID Panel (Reflexed)     Status: Abnormal   Collection Time: 05/08/15  9:34 AM  Result Value Ref Range Status   Enterococcus species NOT DETECTED NOT DETECTED Final   Listeria monocytogenes NOT DETECTED NOT DETECTED Final   Staphylococcus species NOT DETECTED NOT DETECTED Final   Staphylococcus aureus NOT DETECTED NOT DETECTED Final   Streptococcus species DETECTED (A) NOT DETECTED Final    Comment: CRITICAL RESULT CALLED TO, READ BACK BY AND VERIFIED WITH: CALLED TO JASON ROBBINS AT 1949 ON 2/14 17 MLZ    Streptococcus agalactiae NOT DETECTED NOT DETECTED Final   Streptococcus pneumoniae NOT DETECTED NOT DETECTED Final    Streptococcus pyogenes NOT DETECTED NOT DETECTED Final   Acinetobacter baumannii NOT DETECTED NOT DETECTED Final   Enterobacteriaceae species NOT DETECTED NOT DETECTED Final   Enterobacter cloacae complex NOT DETECTED NOT DETECTED Final   Escherichia coli NOT DETECTED NOT DETECTED Final   Klebsiella oxytoca NOT DETECTED NOT DETECTED Final   Klebsiella pneumoniae NOT DETECTED NOT DETECTED Final   Proteus species NOT DETECTED NOT DETECTED Final   Serratia marcescens NOT DETECTED NOT DETECTED Final   Haemophilus influenzae  NOT DETECTED NOT DETECTED Final   Neisseria meningitidis NOT DETECTED NOT DETECTED Final   Pseudomonas aeruginosa NOT DETECTED NOT DETECTED Final   Candida albicans NOT DETECTED NOT DETECTED Final   Candida glabrata NOT DETECTED NOT DETECTED Final   Candida krusei NOT DETECTED NOT DETECTED Final   Candida parapsilosis NOT DETECTED NOT DETECTED Final   Candida tropicalis NOT DETECTED NOT DETECTED Final   Carbapenem resistance NOT DETECTED NOT DETECTED Final   Methicillin resistance NOT DETECTED NOT DETECTED Final   Vancomycin resistance NOT DETECTED NOT DETECTED Final  Culture, blood (Routine X 2) w Reflex to ID Panel     Status: None (Preliminary result)   Collection Time: 05/10/15  3:33 PM  Result Value Ref Range Status   Specimen Description BLOOD RIGHT AC  Final   Special Requests BOTTLES DRAWN AEROBIC AND ANAEROBIC 4ML  Final   Culture NO GROWTH 4 DAYS  Final   Report Status PENDING  Incomplete  Culture, blood (Routine X 2) w Reflex to ID Panel     Status: None (Preliminary result)   Collection Time: 05/10/15  3:33 PM  Result Value Ref Range Status   Specimen Description BLOOD LEFT AC  Final   Special Requests   Final    BOTTLES DRAWN AEROBIC AND ANAEROBIC AER 10ML ANA 4ML   Culture NO GROWTH 4 DAYS  Final   Report Status PENDING  Incomplete  CULTURE, BLOOD (ROUTINE X 2) w Reflex to PCR ID Panel     Status: None (Preliminary result)   Collection Time: 05/13/15   1:08 PM  Result Value Ref Range Status   Specimen Description BLOOD RIGHT ARM  Final   Special Requests BOTTLES DRAWN AEROBIC AND ANAEROBIC [place]  Final   Culture NO GROWTH < 24 HOURS  Final   Report Status PENDING  Incomplete  CULTURE, BLOOD (ROUTINE X 2) w Reflex to PCR ID Panel     Status: None (Preliminary result)   Collection Time: 05/13/15  1:19 PM  Result Value Ref Range Status   Specimen Description BLOOD LEFT HAND  Final   Special Requests BOTTLES DRAWN AEROBIC AND ANAEROBIC [place]  Final   Culture NO GROWTH < 24 HOURS  Final   Report Status PENDING  Incomplete     Studies: Dg Chest 1 View  05/13/2015  CLINICAL DATA:  Cough EXAM: CHEST 1 VIEW COMPARISON:  05/11/15 chest radiograph. FINDINGS: Slightly low lung volumes. Stable cardiomediastinal silhouette with mild cardiomegaly. No pneumothorax. No pleural effusion. Stable mild bibasilar atelectasis. No pulmonary edema. No acute consolidative airspace disease. IMPRESSION: Slightly low lung volumes with stable mild bibasilar atelectasis. Stable mild cardiomegaly without pulmonary edema. Electronically Signed   By: Ilona Sorrel M.D.   On: 05/13/2015 14:50   US Venous Img Lower Bilateral  05/12/2015  CLINICAL DATA:  70 year old male with a history of pulmonary embolism EXAM: BILATERAL LOWER EXTREMITY VENOUS DOPPLER ULTRASOUND TECHNIQUE: Gray-scale sonography with graded compression, as well as color Doppler and duplex ultrasound were performed to evaluate the lower extremity deep venous systems from the level of the common femoral vein and including the common femoral, femoral, profunda femoral, popliteal and calf veins including the posterior tibial, peroneal and gastrocnemius veins when visible. The superficial great saphenous vein was also interrogated. Spectral Doppler was utilized to evaluate flow at rest and with distal augmentation maneuvers in the common femoral, femoral and popliteal veins. COMPARISON:  None. FINDINGS: RIGHT LOWER  EXTREMITY Common Femoral Vein: No evidence of thrombus. Normal compressibility, respiratory  phasicity and response to augmentation. Saphenofemoral Junction: No evidence of thrombus. Normal compressibility and flow on color Doppler imaging. Profunda Femoral Vein: No evidence of thrombus. Normal compressibility and flow on color Doppler imaging. Femoral Vein: No evidence of thrombus. Normal compressibility, respiratory phasicity and response to augmentation. Popliteal Vein: No evidence of thrombus. Normal compressibility, respiratory phasicity and response to augmentation. Calf Veins: No evidence of thrombus. Normal compressibility and flow on color Doppler imaging. Superficial Great Saphenous Vein: No evidence of thrombus. Normal compressibility and flow on color Doppler imaging. Other Findings:  None. LEFT LOWER EXTREMITY Common Femoral Vein: No evidence of thrombus. Normal compressibility, respiratory phasicity and response to augmentation. Saphenofemoral Junction: No evidence of thrombus. Normal compressibility and flow on color Doppler imaging. Profunda Femoral Vein: No evidence of thrombus. Normal compressibility and flow on color Doppler imaging. Femoral Vein: No evidence of thrombus. Normal compressibility, respiratory phasicity and response to augmentation. Popliteal Vein: No evidence of thrombus. Normal compressibility, respiratory phasicity and response to augmentation. Calf Veins: No evidence of thrombus. Normal compressibility and flow on color Doppler imaging. Superficial Great Saphenous Vein: No evidence of thrombus. Normal compressibility and flow on color Doppler imaging. Other Findings:  None. IMPRESSION: Sonographic survey of the bilateral lower extremities negative for DVT. Signed, Dulcy Fanny. Earleen Newport, DO [facility] Electronically Signed   By: Corrie Mckusick D.O.   On: 05/12/2015 13:57    Scheduled Meds: . aspirin EC  81 mg Oral Daily  .  atorvastatin  40 mg Oral q1800  . budesonide (PULMICORT) nebulizer solution  0.25 mg Nebulization BID  . cefTRIAXone (ROCEPHIN)  IV  2 g Intravenous Q24H  . docusate sodium  100 mg Oral BID  . doxazosin  8 mg Oral Daily  . enoxaparin (LOVENOX) injection  1 mg/kg Subcutaneous Q12H  . insulin aspart  0-15 Units Subcutaneous TID AC & HS  . insulin aspart  4 Units Subcutaneous TID WC  . metoprolol tartrate  25 mg Oral BID  . nystatin  5 mL Oral QID  . pantoprazole  40 mg Oral Daily  . QUEtiapine  25 mg Oral QHS   Continuous Infusions: . amiodarone 30 mg/hr (05/14/15 1147)    Assessment/Plan:  1. Atrial fibrillation with rapid ventricular response. Patient is on IV amiodarone and heart rate has responded. I will discontinue the oral Cardizem and continue the oral metoprolol. 2. Acute encephalopathy- this has improved. He got a good night's sleep with a dose of Seroquel at night. 3. Streptococcus mutans sepsis. A TEE will need to be done to rule out endocarditis. Patient on IV Rocephin. Repeat blood cultures negative so far 4. Pulmonary embolism seen on CT scan- start eliquis tonight instead of the high-dose Lovenox 5. Low back pain with right-sided sciatica. Oral pain medications. Off prednisone and gabapentin. 6. Hyperlipidemia unspecified on statin 7. Type 2 diabetes - restart low-dose Levemir and sliding scale 8. Acute on chronic kidney disease- continue to monitor 9. Essential hypertension blood pressure currently stable. 10. Acute respiratory failure yesterday- now improved and breathing comfortably on room air 11. Thrush- nystatin swish and swallow  Code Status:     Code Status Orders        Start     Ordered   05/06/15 2348  Full code   Continuous     05/06/15 2347    Code Status History    Date Active Date Inactive Code Status Order ID Comments User Context   This patient has a  current code status but no historical code status.     Family Communication: Spoke with  wife at the bedside yesterday Disposition Plan: To be determined  Consultants:  Infectious disease  Cardiology  Antibiotics:  Rocephin  Time spent: 24 minutes  Loletha Grayer  [facility]

## 2015-05-14 NOTE — Progress Notes (Signed)
Patient rested quietly tonight and pain managed with PRN Oxy IR. A&Ox4, VSS, and NSR on tele. Nursing staff will continue to monitor. Earleen Reaper, RN

## 2015-05-14 NOTE — Progress Notes (Signed)
Spoke with dr. Leslye Peer to make aware patients heart rate is mid to high 60's. Currently on amiodarone drip at 16.7 and on scheduled 60mg  po cardizem. Scheduled dose has been given however per md will continue amiodarone drip at this time and discontinue po cardizem

## 2015-05-15 ENCOUNTER — Inpatient Hospital Stay
Admit: 2015-05-15 | Discharge: 2015-05-15 | Disposition: A | Discharge status: Home / Self Care | Payer: Non-veteran care | Attending: Internal Medicine | Admitting: Internal Medicine

## 2015-05-15 ENCOUNTER — Inpatient Hospital Stay: Payer: Non-veteran care

## 2015-05-15 ENCOUNTER — Encounter
Admission: EM | Disposition: A | Discharge status: Home Health | Payer: Self-pay | Source: Home / Self Care | Attending: Internal Medicine

## 2015-05-15 HISTORY — PX: TEE WITHOUT CARDIOVERSION: SHX5443

## 2015-05-15 LAB — CULTURE, BLOOD (ROUTINE X 2)
CULTURE: NO GROWTH
CULTURE: NO GROWTH

## 2015-05-15 LAB — GLUCOSE, CAPILLARY
GLUCOSE-CAPILLARY: 267 mg/dL — AB (ref 65–99)
Glucose-Capillary: 103 mg/dL — ABNORMAL HIGH (ref 65–99)
Glucose-Capillary: 284 mg/dL — ABNORMAL HIGH (ref 65–99)
Glucose-Capillary: 332 mg/dL — ABNORMAL HIGH (ref 65–99)

## 2015-05-15 SURGERY — ECHOCARDIOGRAM, TRANSESOPHAGEAL
Anesthesia: Moderate Sedation

## 2015-05-15 MED ORDER — MORPHINE SULFATE (PF) 2 MG/ML IV SOLN
2.0000 mg | INTRAVENOUS | Status: DC | PRN
  Administered 2015-05-15: 2 mg via INTRAVENOUS
  Filled 2015-05-15: qty 1

## 2015-05-15 MED ORDER — FENTANYL CITRATE (PF) 100 MCG/2ML IJ SOLN
INTRAMUSCULAR | Status: AC
  Filled 2015-05-15: qty 4

## 2015-05-15 MED ORDER — INSULIN DETEMIR 100 UNIT/ML ~~LOC~~ SOLN
13.0000 [IU] | Freq: Every day | SUBCUTANEOUS | Status: DC
  Administered 2015-05-15: 13 [IU] via SUBCUTANEOUS
  Filled 2015-05-15 (×2): qty 0.13

## 2015-05-15 MED ORDER — MIDAZOLAM HCL 2 MG/2ML IJ SOLN
INTRAMUSCULAR | Status: AC
  Filled 2015-05-15: qty 4

## 2015-05-15 MED ORDER — BUTAMBEN-TETRACAINE-BENZOCAINE 2-2-14 % EX AERO
INHALATION_SPRAY | CUTANEOUS | Status: AC
  Filled 2015-05-15: qty 20

## 2015-05-15 MED ORDER — PREDNISONE 20 MG PO TABS
20.0000 mg | ORAL_TABLET | Freq: Every day | ORAL | Status: DC
  Administered 2015-05-15 – 2015-05-17 (×3): 20 mg via ORAL
  Filled 2015-05-15 (×3): qty 1

## 2015-05-15 MED ORDER — HYDROMORPHONE HCL 2 MG PO TABS
2.0000 mg | ORAL_TABLET | Freq: Four times a day (QID) | ORAL | Status: DC | PRN
  Administered 2015-05-15 – 2015-05-17 (×5): 2 mg via ORAL
  Filled 2015-05-15 (×5): qty 1

## 2015-05-15 MED ORDER — AMIODARONE HCL 200 MG PO TABS
400.0000 mg | ORAL_TABLET | Freq: Every day | ORAL | Status: DC
  Administered 2015-05-15 – 2015-05-17 (×3): 400 mg via ORAL
  Filled 2015-05-15 (×3): qty 2

## 2015-05-15 MED ORDER — LIDOCAINE VISCOUS 2 % MT SOLN
OROMUCOSAL | Status: AC
  Filled 2015-05-15: qty 15

## 2015-05-15 MED ORDER — LIDOCAINE 5 % EX PTCH
1.0000 | MEDICATED_PATCH | CUTANEOUS | Status: DC
  Administered 2015-05-15 – 2015-05-17 (×3): 1 via TRANSDERMAL
  Filled 2015-05-15 (×3): qty 1

## 2015-05-15 NOTE — Care Management (Signed)
Patient was able to receive treatment by physical therapy today due to uncontrolled pain.  it is reported to CM by CSW that patient and wife have declined skilled nursing and wish to discharge home.  Will speak with patient and his wife to determine prior level of function and determine whether care needs can be met.  There is a care team sticky note stating to call patient's daughter prior to discharge

## 2015-05-15 NOTE — Care Management (Signed)
It appears patient was transferred to 2A due to new Atrial fib with rvr.  TEE pending. Amiodarone drip.  Eliquis new this admission.  Continued physical therapy treatments pending due to a fib

## 2015-05-15 NOTE — Progress Notes (Signed)
[place] INFECTIOUS DISEASE PROGRESS NOTE Date of Admission:  05/06/2015     ID: Eric Glover is a 70 y.o. male with LBP and viridans strep bacteremia  Principal Problem:   Acute on chronic kidney failure ([place]) Active Problems:   HLD (hyperlipidemia)   Type 2 diabetes mellitus (HCC)   HTN (hypertension)   Low back pain   Acute on chronic renal failure (HCC)   Fever   DDD (degenerative disc disease), lumbar   Sciatica associated with disorder of lumbar spine   Subjective: NO more fevers, still with back pain. FU bcx done 2/15.   ROS  Eleven systems are reviewed and negative except per hpi  Medications:  Antibiotics Given (last 72 hours)    Date/Time Action Medication Dose Rate   05/12/15 1748 Given   cefTRIAXone (ROCEPHIN) 2 g in dextrose 5 % 50 mL IVPB 2 g 100 mL/hr   05/13/15 1851 Given   cefTRIAXone (ROCEPHIN) 2 g in dextrose 5 % 50 mL IVPB 2 g 100 mL/hr   05/14/15 1655 Given   cefTRIAXone (ROCEPHIN) 2 g in dextrose 5 % 50 mL IVPB 2 g 100 mL/hr     . amiodarone  400 mg Oral Daily  . apixaban  10 mg Oral BID  . aspirin EC  81 mg Oral Daily  . atorvastatin  40 mg Oral q1800  . budesonide (PULMICORT) nebulizer solution  0.25 mg Nebulization BID  . cefTRIAXone (ROCEPHIN)  IV  2 g Intravenous Q24H  . docusate sodium  100 mg Oral BID  . doxazosin  8 mg Oral Daily  . fentaNYL      . insulin aspart  0-15 Units Subcutaneous TID AC & HS  . insulin aspart  4 Units Subcutaneous TID WC  . insulin detemir  13 Units Subcutaneous QHS  . lidocaine  1 patch Transdermal Q24H  . lidocaine      . metoprolol tartrate  25 mg Oral BID  . midazolam      . nystatin  5 mL Oral QID  . pantoprazole  40 mg Oral Daily  . predniSONE  20 mg Oral Q breakfast  . QUEtiapine  25 mg Oral QHS    Objective: Vital signs in last 24 hours: Temp:  [97.6 F (36.4 C)-99 F (37.2 C)] 99 F (37.2 C) (02/20 1501) Pulse Rate:  [65-73] 68 (02/20 1501) Resp:  [17-22] 19 (02/20 1445) BP:  (95-141)/(56-99) 117/56 mmHg (02/20 1501) SpO2:  [90 %-96 %] 96 % (02/20 1501) Constitutional: He is oriented to person, place, and time. He appears well-developed and well-nourished. No distress. IN pain from back  HENT: anictoric, perrla Mouth/Throat: Oropharynx is clear and moist. No oropharyngeal exudate.  Cardiovascular: Normal rate, regular rhythm and normal heart sounds.2/6 smPulmonary/Chest: Effort normal and breath sounds normal. No respiratory distress. He has no wheezes.  Abdominal: Soft. Bowel sounds are normal. He exhibits no distension. There is no tenderness.  Lymphadenopathy: He has no cervical adenopathy.  Neurological: He is alert and oriented to person, place, and time.  Skin: Skin is warm and dry. No rash noted. No erythema.  Psychiatric: He has a normal mood and affect. His behavior is normal.  BAck pain   Lab Results  Recent Labs  05/13/15 0714  NA 137  K 4.4  CL 106  CO2 24  BUN 29*  CREATININE 1.24   Echo Impressions:  - There was no evidence of a vegetation. The right ventricular systolic pressure was increased consistent with mild pulmonary  hypertension. Normal EF but wall motion abnormalities suggestive of CAD.  Microbiology: Results for orders placed or performed during the hospital encounter of 05/06/15  CULTURE, BLOOD (ROUTINE X 2) w Reflex to PCR ID Panel     Status: None   Collection Time: 05/08/15  9:34 AM  Result Value Ref Range Status   Specimen Description BLOOD LEFT ARM  Final   Special Requests   Final    BOTTLES DRAWN AEROBIC AND ANAEROBIC 7ML ARE 5ML ANA   Culture  Setup Time   Final    GRAM POSITIVE COCCI IN BOTH AEROBIC AND ANAEROBIC BOTTLES CRITICAL RESULT CALLED TO, READ BACK BY AND VERIFIED WITH: JASON ROBBINS AT 1949 ON 2/14 17 MLZ    Culture   Final    STREPTOCOCCUS MUTANS IN BOTH AEROBIC AND ANAEROBIC BOTTLES    Report Status 05/12/2015 FINAL  Final   Organism ID, Bacteria STREPTOCOCCUS MUTANS  Final       Susceptibility   Streptococcus mutans - MIC*    ERYTHROMYCIN Value in next row Sensitive      SENSITIVE<=0.12    LEVOFLOXACIN Value in next row Sensitive      SENSITIVE2    VANCOMYCIN Value in next row Sensitive      SENSITIVE1    * STREPTOCOCCUS MUTANS  CULTURE, BLOOD (ROUTINE X 2) w Reflex to PCR ID Panel     Status: None   Collection Time: 05/08/15  9:34 AM  Result Value Ref Range Status   Specimen Description BLOOD RT ARM  Final   Special Requests   Final    BOTTLES DRAWN AEROBIC AND ANAEROBIC 6ML ANA 9ML AER   Culture  Setup Time   Final    GRAM POSITIVE COCCI IN BOTH AEROBIC AND ANAEROBIC BOTTLES CRITICAL RESULT CALLED TO, READ BACK BY AND VERIFIED WITH: JASON ROBBINS AT 1949 ON 05/09/15 MLZ    Culture   Final    STREPTOCOCCUS MUTANS IN BOTH AEROBIC AND ANAEROBIC BOTTLES    Report Status 05/12/2015 FINAL  Final   Organism ID, Bacteria STREPTOCOCCUS MUTANS  Final      Susceptibility   Streptococcus mutans - MIC*    ERYTHROMYCIN Value in next row Sensitive      SENSITIVE<=0.12    LEVOFLOXACIN Value in next row Sensitive      SENSITIVE2    VANCOMYCIN Value in next row Sensitive      SENSITIVE1    * STREPTOCOCCUS MUTANS  Blood Culture ID Panel (Reflexed)     Status: Abnormal   Collection Time: 05/08/15  9:34 AM  Result Value Ref Range Status   Enterococcus species NOT DETECTED NOT DETECTED Final   Listeria monocytogenes NOT DETECTED NOT DETECTED Final   Staphylococcus species NOT DETECTED NOT DETECTED Final   Staphylococcus aureus NOT DETECTED NOT DETECTED Final   Streptococcus species DETECTED (A) NOT DETECTED Final    Comment: CRITICAL RESULT CALLED TO, READ BACK BY AND VERIFIED WITH: CALLED TO JASON ROBBINS AT 1949 ON 2/14 17 MLZ    Streptococcus agalactiae NOT DETECTED NOT DETECTED Final   Streptococcus pneumoniae NOT DETECTED NOT DETECTED Final   Streptococcus pyogenes NOT DETECTED NOT DETECTED Final   Acinetobacter baumannii NOT DETECTED NOT DETECTED Final    Enterobacteriaceae species NOT DETECTED NOT DETECTED Final   Enterobacter cloacae complex NOT DETECTED NOT DETECTED Final   Escherichia coli NOT DETECTED NOT DETECTED Final   Klebsiella oxytoca NOT DETECTED NOT DETECTED Final   Klebsiella pneumoniae NOT DETECTED NOT DETECTED Final  Proteus species NOT DETECTED NOT DETECTED Final   Serratia marcescens NOT DETECTED NOT DETECTED Final   Haemophilus influenzae NOT DETECTED NOT DETECTED Final   Neisseria meningitidis NOT DETECTED NOT DETECTED Final   Pseudomonas aeruginosa NOT DETECTED NOT DETECTED Final   Candida albicans NOT DETECTED NOT DETECTED Final   Candida glabrata NOT DETECTED NOT DETECTED Final   Candida krusei NOT DETECTED NOT DETECTED Final   Candida parapsilosis NOT DETECTED NOT DETECTED Final   Candida tropicalis NOT DETECTED NOT DETECTED Final   Carbapenem resistance NOT DETECTED NOT DETECTED Final   Methicillin resistance NOT DETECTED NOT DETECTED Final   Vancomycin resistance NOT DETECTED NOT DETECTED Final  Culture, blood (Routine X 2) w Reflex to ID Panel     Status: None   Collection Time: 05/10/15  3:33 PM  Result Value Ref Range Status   Specimen Description BLOOD RIGHT AC  Final   Special Requests BOTTLES DRAWN AEROBIC AND ANAEROBIC 4ML  Final   Culture NO GROWTH 5 DAYS  Final   Report Status 05/15/2015 FINAL  Final  Culture, blood (Routine X 2) w Reflex to ID Panel     Status: None   Collection Time: 05/10/15  3:33 PM  Result Value Ref Range Status   Specimen Description BLOOD LEFT AC  Final   Special Requests   Final    BOTTLES DRAWN AEROBIC AND ANAEROBIC AER 10ML ANA 4ML   Culture NO GROWTH 5 DAYS  Final   Report Status 05/15/2015 FINAL  Final  CULTURE, BLOOD (ROUTINE X 2) w Reflex to PCR ID Panel     Status: None (Preliminary result)   Collection Time: 05/13/15  1:08 PM  Result Value Ref Range Status   Specimen Description BLOOD RIGHT ARM  Final   Special Requests BOTTLES DRAWN AEROBIC AND ANAEROBIC [place]   Final   Culture NO GROWTH 2 DAYS  Final   Report Status PENDING  Incomplete  CULTURE, BLOOD (ROUTINE X 2) w Reflex to PCR ID Panel     Status: None (Preliminary result)   Collection Time: 05/13/15  1:19 PM  Result Value Ref Range Status   Specimen Description BLOOD LEFT HAND  Final   Special Requests BOTTLES DRAWN AEROBIC AND ANAEROBIC [place]  Final   Culture NO GROWTH 2 DAYS  Final   Report Status PENDING  Incomplete    Studies/Results: No results found.  Assessment/Plan: Assessment:  Eric Glover is a 70 y.o. male admitted with LBP and found to have fevers, strep bacteremia MRI neg for osteomyelitis. Had recent dental work. Echo neg, TEE negative  BCX with strep mutans -  discussed with Micro - amp MIC is <0.25 and CTX sensitive Repeat bcx neg from 2/15  Recommendations Even with negative TEE would rec a 2 week course of IV abx from first negative blood culture Still some concern for dicitis despite negative MRI- can repeat in 2 weeks if still with back pain Cont ceftriaxone 2 gm q 24  Thank you very much for the consult. Will follow with you.  [place], [place]   05/15/2015, 3:15 PM

## 2015-05-15 NOTE — Progress Notes (Signed)
Dr. Leslye Peer notified of patient's change in mental status. Stated to wait for TEE results before any other interventions at this time. Eric Glover

## 2015-05-15 NOTE — Progress Notes (Signed)
Patient c/o severe back pain this am rating 10/10. Dr. Leslye Peer notified and lidoderm patch ordered along with predisone and morphine. Patient has received all of these at this time. Small improvement of pain level, but wife is concerned patient is not accurately describing pain. According to wife patient mental status is altered, patient was able to answer all questions appropriately this morning, however is forgetful. Will continue to assess. Patient currently off floor for TEE.  Eric Glover

## 2015-05-15 NOTE — Progress Notes (Signed)
CSW went to discuss discharge plans with patient. Patient was not in his room he was down for a procedure. CSW called patient's wife Bethena Roys. Per Bethena Roys patient is going to return home at discharge. She reports that she feels they can manage at home. She reports that they are interested in [facility] services. RN Case Manager informed of above. There are no CSW needs at this time. CSW will be available if a need were to arise. CSW is signing off.   Ernest Pine, MSW, [place] Social Work Department [phone]

## 2015-05-15 NOTE — Progress Notes (Signed)
Pt awakened confused and attempting to get out of bed unassisted.  Reports "I am being held prisoner".  Attempts to reorient unsuccessful.  Pt able to recite home phone number.  Wife phoned and spoke with patient.  Wife agrees to come sit with patient. Pt calm and cooperative after speaking with wife.  Will continue to monitor.

## 2015-05-15 NOTE — Progress Notes (Signed)
PT Cancellation Note  Patient Details Name: [name] MRN: [id number] DOB: Dec 16, 1945   Cancelled Treatment:    Reason Eval/Treat Not Completed: Other (comment). Pt reports pain at 9.5/10 at this time. Pt refuses any therapy despite encouragement from PT. Pt reports he is unable to walk or do bed there-ex secondary to uncontrolled pain. Request to have therapy at another time.   Ashling Roane 05/15/2015, 11:14 AM  Greggory Stallion, PT, DPT [phone]

## 2015-05-15 NOTE — Progress Notes (Signed)
*  PRELIMINARY RESULTS* Echocardiogram Echocardiogram Transesophageal has been performed.  Eric Glover 05/15/2015, 2:02 PM

## 2015-05-15 NOTE — Progress Notes (Signed)
Patient ID: Eric Glover, male   DOB: 18-Dec-1945, 70 y.o.   MRN: [id number]  [facility] PROGRESS NOTE  Eric Glover [id number] DOB: 1945/10/26 DOA: 05/06/2015 PCP: Ashok Norris, MD  HPI/Subjective: Patient having a lot of back pain. Patient still has some confusion. When I walked in the room earlier he urinated on the floor. He was unable to maneuver the urine on the right physician to urinate in it. Still with some cough.  Objective: Filed Vitals:   05/15/15 1401 05/15/15 1416  BP: 128/89 95/71  Pulse: 71 69  Temp:    Resp: 22 20    Filed Weights   05/06/15 1508 05/06/15 2349 05/09/15 1259  Weight: 104.327 kg (230 lb) 102.785 kg (226 lb 9.6 oz) 107.049 kg (236 lb)    ROS: Review of Systems  Constitutional: Negative for fever and chills.  Eyes: Negative for blurred vision.  Respiratory: Positive for cough. Negative for shortness of breath.   Cardiovascular: Negative for chest pain.  Gastrointestinal: Negative for nausea, vomiting, abdominal pain, diarrhea and constipation.  Genitourinary: Negative for dysuria.  Musculoskeletal: Positive for back pain. Negative for joint pain.  Neurological: Negative for dizziness and headaches.   Exam: Physical Exam  Constitutional: He is oriented to person, place, and time.  HENT:  Nose: No mucosal edema.  Mouth/Throat: No oropharyngeal exudate or posterior oropharyngeal edema.  Eyes: Conjunctivae, EOM and lids are normal. Pupils are equal, round, and reactive to light.  Neck: No JVD present. Carotid bruit is not present. No edema present. No thyroid mass and no thyromegaly present.  Cardiovascular: S1 normal and S2 normal.  An irregularly irregular rhythm present. Exam reveals no gallop.   Murmur heard.  Systolic murmur is present with a grade of 2/6  Pulses:      Dorsalis pedis pulses are 1+ on the right side, and 1+ on the left side.  Respiratory: No respiratory distress. He has no wheezes. He has no  rhonchi. He has no rales.  GI: Soft. Bowel sounds are normal. There is no tenderness.  Musculoskeletal:       Right ankle: He exhibits swelling.       Left ankle: He exhibits swelling.  Lymphadenopathy:    He has no cervical adenopathy.  Neurological: He is alert and oriented to person, place, and time.  Able to straight leg raise bilaterally.  Skin: Skin is warm. No rash noted. Nails show no clubbing.  Psychiatric: He has a normal mood and affect.      Data Reviewed: Basic Metabolic Panel:  Recent Labs Lab 05/09/15 0545 05/10/15 0503 05/11/15 0500 05/13/15 0714  NA 131* 135 134* 137  K 4.2 4.7 4.5 4.4  CL 105 110 108 106  CO2 18* 19* 21* 24  GLUCOSE 159* 271* 267* 172*  BUN 47* 43* 38* 29*  CREATININE 1.71* 1.45* 1.39* 1.24  CALCIUM 8.0* 8.1* 8.0* 8.1*    CBC:  Recent Labs Lab 05/09/15 0545 05/10/15 0503 05/11/15 0500  WBC 8.8 9.6 9.2  HGB 10.7* 10.9* 11.1*  HCT 32.0* 32.8* 33.4*  MCV 81.9 81.7 82.3  PLT 277 260 247   Cardiac Enzymes:  Recent Labs Lab 05/11/15 1532 05/11/15 2107 05/12/15 0337  TROPONINI 0.07* 0.06* 0.05*    CBG:  Recent Labs Lab 05/14/15 1055 05/14/15 1614 05/14/15 2114 05/15/15 0731 05/15/15 1109  GLUCAP 227* 172* 242* 267* 103*    Recent Results (from the past 240 hour(s))  CULTURE, BLOOD (ROUTINE X 2) w Reflex to PCR ID  Panel     Status: None   Collection Time: 05/08/15  9:34 AM  Result Value Ref Range Status   Specimen Description BLOOD LEFT ARM  Final   Special Requests   Final    BOTTLES DRAWN AEROBIC AND ANAEROBIC 7ML ARE 5ML ANA   Culture  Setup Time   Final    GRAM POSITIVE COCCI IN BOTH AEROBIC AND ANAEROBIC BOTTLES CRITICAL RESULT CALLED TO, READ BACK BY AND VERIFIED WITH: JASON ROBBINS AT 1949 ON 2/14 17 MLZ    Culture   Final    STREPTOCOCCUS MUTANS IN BOTH AEROBIC AND ANAEROBIC BOTTLES    Report Status 05/12/2015 FINAL  Final   Organism ID, Bacteria STREPTOCOCCUS MUTANS  Final      Susceptibility    Streptococcus mutans - MIC*    ERYTHROMYCIN Value in next row Sensitive      SENSITIVE<=0.12    LEVOFLOXACIN Value in next row Sensitive      SENSITIVE2    VANCOMYCIN Value in next row Sensitive      SENSITIVE1    * STREPTOCOCCUS MUTANS  CULTURE, BLOOD (ROUTINE X 2) w Reflex to PCR ID Panel     Status: None   Collection Time: 05/08/15  9:34 AM  Result Value Ref Range Status   Specimen Description BLOOD RT ARM  Final   Special Requests   Final    BOTTLES DRAWN AEROBIC AND ANAEROBIC 6ML ANA 9ML AER   Culture  Setup Time   Final    GRAM POSITIVE COCCI IN BOTH AEROBIC AND ANAEROBIC BOTTLES CRITICAL RESULT CALLED TO, READ BACK BY AND VERIFIED WITH: JASON ROBBINS AT 1949 ON 05/09/15 MLZ    Culture   Final    STREPTOCOCCUS MUTANS IN BOTH AEROBIC AND ANAEROBIC BOTTLES    Report Status 05/12/2015 FINAL  Final   Organism ID, Bacteria STREPTOCOCCUS MUTANS  Final      Susceptibility   Streptococcus mutans - MIC*    ERYTHROMYCIN Value in next row Sensitive      SENSITIVE<=0.12    LEVOFLOXACIN Value in next row Sensitive      SENSITIVE2    VANCOMYCIN Value in next row Sensitive      SENSITIVE1    * STREPTOCOCCUS MUTANS  Blood Culture ID Panel (Reflexed)     Status: Abnormal   Collection Time: 05/08/15  9:34 AM  Result Value Ref Range Status   Enterococcus species NOT DETECTED NOT DETECTED Final   Listeria monocytogenes NOT DETECTED NOT DETECTED Final   Staphylococcus species NOT DETECTED NOT DETECTED Final   Staphylococcus aureus NOT DETECTED NOT DETECTED Final   Streptococcus species DETECTED (A) NOT DETECTED Final    Comment: CRITICAL RESULT CALLED TO, READ BACK BY AND VERIFIED WITH: CALLED TO JASON ROBBINS AT 1949 ON 2/14 17 MLZ    Streptococcus agalactiae NOT DETECTED NOT DETECTED Final   Streptococcus pneumoniae NOT DETECTED NOT DETECTED Final   Streptococcus pyogenes NOT DETECTED NOT DETECTED Final   Acinetobacter baumannii NOT DETECTED NOT DETECTED Final   Enterobacteriaceae  species NOT DETECTED NOT DETECTED Final   Enterobacter cloacae complex NOT DETECTED NOT DETECTED Final   Escherichia coli NOT DETECTED NOT DETECTED Final   Klebsiella oxytoca NOT DETECTED NOT DETECTED Final   Klebsiella pneumoniae NOT DETECTED NOT DETECTED Final   Proteus species NOT DETECTED NOT DETECTED Final   Serratia marcescens NOT DETECTED NOT DETECTED Final   Haemophilus influenzae NOT DETECTED NOT DETECTED Final   Neisseria meningitidis NOT DETECTED NOT DETECTED Final  Pseudomonas aeruginosa NOT DETECTED NOT DETECTED Final   Candida albicans NOT DETECTED NOT DETECTED Final   Candida glabrata NOT DETECTED NOT DETECTED Final   Candida krusei NOT DETECTED NOT DETECTED Final   Candida parapsilosis NOT DETECTED NOT DETECTED Final   Candida tropicalis NOT DETECTED NOT DETECTED Final   Carbapenem resistance NOT DETECTED NOT DETECTED Final   Methicillin resistance NOT DETECTED NOT DETECTED Final   Vancomycin resistance NOT DETECTED NOT DETECTED Final  Culture, blood (Routine X 2) w Reflex to ID Panel     Status: None   Collection Time: 05/10/15  3:33 PM  Result Value Ref Range Status   Specimen Description BLOOD RIGHT AC  Final   Special Requests BOTTLES DRAWN AEROBIC AND ANAEROBIC 4ML  Final   Culture NO GROWTH 5 DAYS  Final   Report Status 05/15/2015 FINAL  Final  Culture, blood (Routine X 2) w Reflex to ID Panel     Status: None   Collection Time: 05/10/15  3:33 PM  Result Value Ref Range Status   Specimen Description BLOOD LEFT AC  Final   Special Requests   Final    BOTTLES DRAWN AEROBIC AND ANAEROBIC AER 10ML ANA 4ML   Culture NO GROWTH 5 DAYS  Final   Report Status 05/15/2015 FINAL  Final  CULTURE, BLOOD (ROUTINE X 2) w Reflex to PCR ID Panel     Status: None (Preliminary result)   Collection Time: 05/13/15  1:08 PM  Result Value Ref Range Status   Specimen Description BLOOD RIGHT ARM  Final   Special Requests BOTTLES DRAWN AEROBIC AND ANAEROBIC [place]  Final   Culture NO  GROWTH 2 DAYS  Final   Report Status PENDING  Incomplete  CULTURE, BLOOD (ROUTINE X 2) w Reflex to PCR ID Panel     Status: None (Preliminary result)   Collection Time: 05/13/15  1:19 PM  Result Value Ref Range Status   Specimen Description BLOOD LEFT HAND  Final   Special Requests BOTTLES DRAWN AEROBIC AND ANAEROBIC [place]  Final   Culture NO GROWTH 2 DAYS  Final   Report Status PENDING  Incomplete     Studies: Dg Chest 1 View  05/13/2015  CLINICAL DATA:  Cough EXAM: CHEST 1 VIEW COMPARISON:  05/11/15 chest radiograph. FINDINGS: Slightly low lung volumes. Stable cardiomediastinal silhouette with mild cardiomegaly. No pneumothorax. No pleural effusion. Stable mild bibasilar atelectasis. No pulmonary edema. No acute consolidative airspace disease. IMPRESSION: Slightly low lung volumes with stable mild bibasilar atelectasis. Stable mild cardiomegaly without pulmonary edema. Electronically Signed   By: Ilona Sorrel M.D.   On: 05/13/2015 14:50    Scheduled Meds: . [MAR Hold] amiodarone  400 mg Oral Daily  . [MAR Hold] apixaban  10 mg Oral BID  . [MAR Hold] aspirin EC  81 mg Oral Daily  . [MAR Hold] atorvastatin  40 mg Oral q1800  . [MAR Hold] budesonide (PULMICORT) nebulizer solution  0.25 mg Nebulization BID  . [MAR Hold] cefTRIAXone (ROCEPHIN)  IV  2 g Intravenous Q24H  . [MAR Hold] docusate sodium  100 mg Oral BID  . [MAR Hold] doxazosin  8 mg Oral Daily  . fentaNYL      . [MAR Hold] insulin aspart  0-15 Units Subcutaneous TID AC & HS  . [MAR Hold] insulin aspart  4 Units Subcutaneous TID WC  . insulin detemir  13 Units Subcutaneous QHS  . [MAR Hold] lidocaine  1 patch Transdermal Q24H  . lidocaine      . [  MAR Hold] metoprolol tartrate  25 mg Oral BID  . midazolam      . [MAR Hold] nystatin  5 mL Oral QID  . [MAR Hold] pantoprazole  40 mg Oral Daily  . [MAR Hold] predniSONE  20 mg Oral Q breakfast  . [MAR Hold] QUEtiapine  25 mg Oral QHS    Assessment/Plan:  1. Atrial  fibrillation with rapid ventricular response. Patient converted to normal sinus rhythm. Convert IV amiodarone to oral amiodarone. Continue the oral metoprolol. 2. Acute encephalopathy- this has improved since Saturday but still impaired. He was unable to maneuver the urinal to urinate and it and urinated on the floor. I will get an MRI of the brain for further evaluation 3. Streptococcus mutans sepsis. A TEE is negative for endocarditis. Patient on IV Rocephin. Case discussed with Dr. Ola Spurr infectious disease and he would like to weeks worth of IV antibiotics and return to the office at that time for further evaluation. Repeat blood cultures are negative. 4. Pulmonary embolism seen on CT scan- on eliquis. 5. Low back pain with right-sided sciatica. Oral pain medications. When necessary IV morphine is not helping. We'll give oral dilaudid. Try Lidoderm patch. Restart prednisone. 6. Hyperlipidemia unspecified on statin 7. Type 2 diabetes - restart low-dose Levemir and sliding scale 8. Acute on chronic kidney disease- continue to monitor 9. Essential hypertension blood pressure currently stable. 10. Acute respiratory failure yesterday- now improved and breathing comfortably on room air 11. Thrush- nystatin swish and swallow  Code Status:     Code Status Orders        Start     Ordered   05/06/15 2348  Full code   Continuous     05/06/15 2347    Code Status History    Date Active Date Inactive Code Status Order ID Comments User Context   This patient has a current code status but no historical code status.     Family Communication: Spoke with wife at the bedside this morning Disposition Plan: To be determined  Consultants:  Infectious disease  Cardiology  Antibiotics:  Rocephin  Time spent: 26 minutes  Loletha Grayer  [facility]

## 2015-05-15 NOTE — Care Management (Signed)
It is verbally reported that that patient has declined skilled nursing placement recommendation.  Patient  back o unit after TEE.  He is sedated at present.  TEE showed no evidence of thrombus or vegetation.

## 2015-05-15 NOTE — Procedures (Signed)
    TRANSESOPHAGEAL ECHOCARDIOGRAM   NAME:  Eric Glover   MRN: [id number] DOB:  January 18, 1946   ADMIT DATE: 05/06/2015  INDICATIONS:  [name]   PROCEDURE:   Informed consent was obtained prior to the procedure. The risks, benefits and alternatives for the procedure were discussed and the patient comprehended these risks.  Risks include, but are not limited to, cough, sore throat, vomiting, nausea, somnolence, esophageal and stomach trauma or perforation, bleeding, low blood pressure, aspiration, pneumonia, infection, trauma to the teeth and death.    After a procedural time-out, the patient was given 2 mg versed and 50 mcg fentanyl for moderate sedation.  The oropharynx was anesthetized 1 cc of topical 1% viscous lidocaine.  The transesophageal probe was inserted in the esophagus and stomach without difficulty and multiple views were obtained.    COMPLICATIONS:    There were no immediate complications.  FINDINGS:  LEFT VENTRICLE: EF = 55%. No regional wall motion abnormalities.  RIGHT VENTRICLE: Normal size and function.   LEFT ATRIUM: Normal size, no thrombus  LEFT ATRIAL APPENDAGE: No thrombus.   RIGHT ATRIUM: Normal size  AORTIC VALVE:  Trileaflet. No vegetations or thrombus  MITRAL VALVE:    Normal. Mo vegetations or thrombus noted  TRICUSPID VALVE: Normal. No vegetations  PULMONIC VALVE: Grossly normal.  INTERATRIAL SEPTUM: No PFO or ASD. Agitated saline contrast used  PERICARDIUM: No effusion  DESCENDING AORTA: Not well visualized   CONCLUSION:  No evidence of vegetations or thrombus Left atrial appendage with no thrombus Full note to follow

## 2015-05-15 NOTE — Progress Notes (Signed)
Patient now reporting improvement of back pain. Mental status is back to baseline at this time. Patient alert and oriented with family at bedside. Wilnette Kales

## 2015-05-15 NOTE — Progress Notes (Addendum)
Inpatient Diabetes Program Recommendations  AACE/ADA: New Consensus Statement on Inpatient Glycemic Control (2015)  Target Ranges:  Prepandial:   less than 140 mg/dL      Peak postprandial:   less than 180 mg/dL (1-2 hours)      Critically ill patients:  140 - 180 mg/dL   Results for Eric Glover, Eric Glover (MRN [id number]) as of 05/15/2015 11:35  Ref. Range 05/14/2015 07:24 05/14/2015 10:55 05/14/2015 16:14 05/14/2015 21:14  Glucose-Capillary Latest Ref Range: 65-99 mg/dL 233 (H) 227 (H) 172 (H) 242 (H)   Results for Eric Glover, Eric Glover (MRN [id number]) as of 05/15/2015 11:35  Ref. Range 05/15/2015 07:31 05/15/2015 11:09  Glucose-Capillary Latest Ref Range: 65-99 mg/dL 267 (H) 103 (H)    Home Diabetes meds: Levemir 40 units q HS  Current Insulin Orders: Novolog 4 units tid with meals      Novolog Moderate Correction Scale/ SSI (0-15 units) tidwc and at bedtime     -Levemir stopped a few days ago.  Last dose Levemir was given bedtime on 02/17.  -Unsure why Levemir was stopped.  Do not see any documented Hypoglycemic events.  -Now patient having elevated fasting glucose levels the last two days.     MD- Please consider restarting a small portion of patient's home dose of Levemir-  Levemir 13 units daily (approximately 1/3 total home dose)      --Will follow patient during hospitalization--  Wyn Quaker RN, MSN, CDE Diabetes Coordinator Inpatient Glycemic Control Team Team Pager: [phone] (8a-5p)

## 2015-05-16 ENCOUNTER — Encounter: Payer: Self-pay | Admitting: Cardiology

## 2015-05-16 ENCOUNTER — Inpatient Hospital Stay: Payer: Non-veteran care

## 2015-05-16 LAB — CBC WITH DIFFERENTIAL/PLATELET
BASOS ABS: 0 10*3/uL (ref 0–0.1)
BASOS PCT: 0 %
Eosinophils Absolute: 0 10*3/uL (ref 0–0.7)
Eosinophils Relative: 0 %
HEMATOCRIT: 30.6 % — AB (ref 40.0–52.0)
HEMOGLOBIN: 10.1 g/dL — AB (ref 13.0–18.0)
LYMPHS PCT: 8 %
Lymphs Abs: 0.6 10*3/uL — ABNORMAL LOW (ref 1.0–3.6)
MCH: 26.5 pg (ref 26.0–34.0)
MCHC: 33.1 g/dL (ref 32.0–36.0)
MCV: 80.2 fL (ref 80.0–100.0)
MONOS PCT: 5 %
Monocytes Absolute: 0.5 10*3/uL (ref 0.2–1.0)
NEUTROS ABS: 7.3 10*3/uL — AB (ref 1.4–6.5)
NEUTROS PCT: 87 %
Platelets: 274 10*3/uL (ref 150–440)
RBC: 3.81 MIL/uL — ABNORMAL LOW (ref 4.40–5.90)
RDW: 15.7 % — ABNORMAL HIGH (ref 11.5–14.5)
WBC: 8.4 10*3/uL (ref 3.8–10.6)

## 2015-05-16 LAB — GLUCOSE, CAPILLARY
GLUCOSE-CAPILLARY: 383 mg/dL — AB (ref 65–99)
GLUCOSE-CAPILLARY: 222 mg/dL — AB (ref 65–99)
GLUCOSE-CAPILLARY: 310 mg/dL — AB (ref 65–99)
Glucose-Capillary: 342 mg/dL — ABNORMAL HIGH (ref 65–99)

## 2015-05-16 LAB — CREATININE, SERUM
Creatinine, Ser: 1.33 mg/dL — ABNORMAL HIGH (ref 0.61–1.24)
GFR, EST NON AFRICAN AMERICAN: 53 mL/min — AB (ref >60)

## 2015-05-16 MED ORDER — GADOBENATE DIMEGLUMINE 529 MG/ML IV SOLN
20.0000 mL | Freq: Once | INTRAVENOUS | Status: AC | PRN
  Administered 2015-05-16: 20 mL via INTRAVENOUS

## 2015-05-16 MED ORDER — APIXABAN 5 MG PO TABS: 5.0000 mg | ORAL_TABLET | Freq: Two times a day (BID) | ORAL | Status: DC

## 2015-05-16 MED ORDER — FUROSEMIDE 40 MG PO TABS
40.0000 mg | ORAL_TABLET | Freq: Once | ORAL | Status: AC
  Administered 2015-05-16: 40 mg via ORAL
  Filled 2015-05-16: qty 1

## 2015-05-16 MED ORDER — INSULIN DETEMIR 100 UNIT/ML ~~LOC~~ SOLN
25.0000 [IU] | Freq: Every day | SUBCUTANEOUS | Status: DC
  Administered 2015-05-16: 25 [IU] via SUBCUTANEOUS
  Filled 2015-05-16 (×2): qty 0.25

## 2015-05-16 NOTE — Progress Notes (Signed)
Spoke with [facility] about PICC access, stated that ETA is around 1:30p. Consent signed. Wilnette Kales

## 2015-05-16 NOTE — Clinical Documentation Improvement (Signed)
Internal Medicine  Can the diagnosis of Acute Encephalopathy be further specified? Please document response in next progress note NOT in BPA drop down box. Thanks!   Metabolic Encephalopathy  Encephalopathy due to drugs - please identify substance if applicable  Other Specified  Clinically Undetermined  Supporting Information:  Please exercise your independent, professional judgment when responding. A specific answer is not anticipated or expected.  Thank You, Zoila Shutter RN, BSN, [place] [phone]; Cell: [phone]

## 2015-05-16 NOTE — Consult Note (Signed)
WOC wound consult note Reason for Consult: Stage 2 pressure injury to left upper buttocks.  Nonblanchable redness to coccyx.  Wound type:Stage 2 pressure injury Pressure Ulcer POA: No Measurement:2 cm x 1 cm x 0.1 cm denuded skin Wound [id number] and moist Drainage (amount, consistency, odor) scant serous Periwound:Nonblanchable erythema.  During hospital stay, wore disposable briefs due to diarrhea and states that is when he noticed tenderness.  Dressing procedure/placement/frequency:Cleanse pressure injury to left buttocks with soap and water.  Pat gently dry.  Apply barrier cream twice daily. Patient discharging Wednesday, please send home with barrier cream.  Will not follow at this time.  Please re-consult if needed.  Domenic Moras RN BSN [place] Pager [phone]

## 2015-05-16 NOTE — Progress Notes (Signed)
Physical Therapy Treatment Patient Details Name: Eric Glover MRN: [id number] DOB: 1945-07-18 Today's Date: 05/16/2015    History of Present Illness presented to ER and admitted under observation for acute exacerbation of LBP (with radicular symptoms); also noted in acute/chronic renal failure. MRI positive for possible diskitis? Pt is now s/p epidural for pain control. Further imaging performed including MRI and TEE; both negative    PT Comments    Pt is making good progress towards goals with increased ambulation distance this session. Pt with improved technique using rw with no signs of buckling or LOB noted. +2 for safety used. Pt motivated to perform therapy and reports pain has improved since previous date. Good endurance noted with there-ex.  Follow Up Recommendations  Home health PT     Equipment Recommendations       Recommendations for Other Services       Precautions / Restrictions Precautions Precautions: Fall Restrictions Weight Bearing Restrictions: No    Mobility  Bed Mobility Overal bed mobility: Modified Independent Bed Mobility: Supine to Sit;Sit to Supine     Supine to sit: Supervision Sit to supine: Supervision   General bed mobility comments: safe technique performed. Once seated at EOB, pt able to sit safely  Transfers Overall transfer level: Needs assistance Equipment used: Rolling walker (2 wheeled) Transfers: Sit to/from Stand Sit to Stand: Supervision         General transfer comment: transfers performed with rw and safe technique. Once standing, pt able to stand safely  Ambulation/Gait Ambulation/Gait assistance: Min guard Ambulation Distance (Feet): 200 Feet Assistive device: Rolling walker (2 wheeled) Gait Pattern/deviations: Step-through pattern     General Gait Details: ambulated with reciprocal gait pattern with safe technique. No buckling or LOB noted. Very fluid gait pattern noted   Stairs            Wheelchair  Mobility    Modified Rankin (Stroke Patients Only)       Balance                                    Cognition Arousal/Alertness: Awake/alert Behavior During Therapy: WFL for tasks assessed/performed Overall Cognitive Status: Within Functional Limits for tasks assessed                      Exercises Other Exercises Other Exercises: Supine ther-ex performed including B quad sets, SLRs, hip abd/add, and B UE shoulder flexion    General Comments        Pertinent Vitals/Pain Pain Assessment: 0-10 Pain Score: 4  Pain Location: low back Pain Descriptors / Indicators: Constant;Dull Pain Intervention(s): Limited activity within patient's tolerance    Home Living                      Prior Function            PT Goals (current goals can now be found in the care plan section) Acute Rehab PT Goals Patient Stated Goal: to make the pain better PT Goal Formulation: With patient/family Time For Goal Achievement: 05/21/15 Potential to Achieve Goals: Good Progress towards PT goals: Progressing toward goals    Frequency  Min 2X/week    PT Plan Current plan remains appropriate    Co-evaluation             End of Session Equipment Utilized During Treatment: Gait belt Activity Tolerance: Patient tolerated treatment  well Patient left: in bed;with call bell/phone within reach;with bed alarm set;with family/visitor present     Time: [id number] PT Time Calculation (min) (ACUTE ONLY): 23 min  Charges:  $Gait Training: 8-22 mins $Therapeutic Exercise: 8-22 mins                    G Codes:      Trestin Vences May 18, 2015, 4:48 PM  Greggory Stallion, PT, DPT [phone]

## 2015-05-16 NOTE — Care Management (Addendum)
CM verbally informed that patient to have PICC line placed today and to discharge home tomorrow on home IV antibiotics.  Spoke with patient and wife.  Both are in agreement with discharge home and agreeable to learn administration . Discussed CM concerns that patient has not been able to participate with physical therapy due to pain.  There are concerns as to how patient and his wife will manage patient's functional status at home.  Both says that pain is under better control today.  Physical therapy is to treat today.  Discussed with patient the need to really extend himself to participate fully with therapy today.     Agency preference is [facility].  [facility] is not able to staff nursing.  Agency preference then would be [facility].  Patient will require SN, PT, BSC and front wheeled rolling walker. [facility] pharmacy referral made.  Per ID, anticipate IV antibiotic will be Rocephin.  Dr Ola Spurr has agreed to sign the home health orders.  Discussed medicare D coverage with patient.  He says he receives his meds through the [place] and [employer] order.   Updated Margarita Grizzle at [facility] on plan.

## 2015-05-16 NOTE — Progress Notes (Signed)
Verbal order received from Dr. Leslye Peer to consult wound nurse. Patient c/o pain on buttocks. Stage II present on L buttocks. Eric Glover

## 2015-05-16 NOTE — Progress Notes (Signed)
Infectious Disease Long Term IV Antibiotic Orders  Diagnosis: Strep mutans bacteremia Eric Glover is a 70 y.o. male admitted with LBP and found to have fevers, strep bacteremia MRI neg for osteomyelitis. Had recent dental work. Echo neg, TEE negative BCX with strep mutans - discussed with Micro - amp MIC is <0.25 and CTX sensitive Repeat bcx neg from 2/15 Recommendations Even with negative TEE would rec a 2 week course of IV abx from first negative blood culture Still some concern for dicitis despite negative MRI- can repeat in 2 weeks if still with back pain Cont ceftriaxone 2 gm q 24   Dispo - home with [facility]   Culture results Strep mutans  Allergies:  Allergies  Allergen Reactions  . Neomycin-Bacitracin Zn-Polymyx Rash    Discharge antibiotics  Ceftriaxone 2 grams every      24   hours  PICC Care per protocol Labs weekly while on IV antibiotics      CBC w diff   Comprehensive met panel  Planned duration of antibiotics 2 weeks from 2/15  Stop date 05/24/15  Follow up clinic date TBD  FAX weekly labs to [phone]  Leonel Ramsay, MD

## 2015-05-16 NOTE — Clinical Documentation Improvement (Signed)
Internal Medicine  Can the diagnosis of Atrial Fibrillation be further specified? Please document response in next progress note NOT in BPA drop down box. Thanks!   Chronic Atrial fibrillation  Paroxysmal Atrial fibrillation  Permanent Atrial fibrillation  Persistent Atrial fibrillation  Other  Clinically Undetermined  Document any associated diagnoses/conditions.  Supporting Information:  Convert IV amiodarone to oral amiodarone. Continue the oral metoprolol.   Please exercise your independent, professional judgment when responding. A specific answer is not anticipated or expected.  Thank You,  Zoila Shutter RN, BSN, [place] [phone]; Cell: [phone]

## 2015-05-16 NOTE — Progress Notes (Signed)
PT Cancellation Note  Patient Details Name: Verley Laudon MRN: [id number] DOB: [birth year]   Cancelled Treatment:    Reason Eval/Treat Not Completed: Other (comment). Treatment attempted, however pt receiving PICC line at this time, requested to return later in PM.   Yovan Leeman 05/16/2015, 2:13 PM  Greggory Stallion, PT, DPT [phone]

## 2015-05-16 NOTE — Progress Notes (Signed)
Patient ID: Palash Douse, male   DOB: Aug 28, 1945, 70 y.o.   MRN: [id number]  [facility] PROGRESS NOTE  Rudolpho Congo [id number] DOB: 06-20-1945 DOA: 05/06/2015 PCP: Ashok Norris, MD  HPI/Subjective: Patient's back pain was better today. Patient answered all questions appropriately. Some cough.  Objective: Filed Vitals:   2015-05-20 0837 05-20-15 1108  BP: 150/70 153/66  Pulse: 63 65  Temp:  97.5 F (36.4 C)  Resp:  20    Filed Weights   05/06/15 1508 05/06/15 2349 05/09/15 1259  Weight: 104.327 kg (230 lb) 102.785 kg (226 lb 9.6 oz) 107.049 kg (236 lb)    ROS: Review of Systems  Constitutional: Negative for fever and chills.  Eyes: Negative for blurred vision.  Respiratory: Positive for cough. Negative for shortness of breath.   Cardiovascular: Negative for chest pain.  Gastrointestinal: Negative for nausea, vomiting, abdominal pain, diarrhea and constipation.  Genitourinary: Negative for dysuria.  Musculoskeletal: Positive for back pain. Negative for joint pain.  Neurological: Negative for dizziness and headaches.   Exam: Physical Exam  Constitutional: He is oriented to person, place, and time.  HENT:  Nose: No mucosal edema.  Mouth/Throat: No oropharyngeal exudate or posterior oropharyngeal edema.  Eyes: Conjunctivae, EOM and lids are normal. Pupils are equal, round, and reactive to light.  Neck: No JVD present. Carotid bruit is not present. No edema present. No thyroid mass and no thyromegaly present.  Cardiovascular: S1 normal and S2 normal.  An irregularly irregular rhythm present. Exam reveals no gallop.   Murmur heard.  Systolic murmur is present with a grade of 2/6  Pulses:      Dorsalis pedis pulses are 1+ on the right side, and 1+ on the left side.  Respiratory: No respiratory distress. He has no wheezes. He has no rhonchi. He has no rales.  GI: Soft. Bowel sounds are normal. There is no tenderness.  Musculoskeletal:       Right  ankle: He exhibits swelling.       Left ankle: He exhibits swelling.  Lymphadenopathy:    He has no cervical adenopathy.  Neurological: He is alert and oriented to person, place, and time.  Able to straight leg raise bilaterally.  Skin: Skin is warm. No rash noted. Nails show no clubbing.  Psychiatric: He has a normal mood and affect.      Data Reviewed: Basic Metabolic Panel:  Recent Labs Lab 05/10/15 0503 05/11/15 0500 05/13/15 0714 2015/05/20 0429  NA 135 134* 137  --   K 4.7 4.5 4.4  --   CL 110 108 106  --   CO2 19* 21* 24  --   GLUCOSE 271* 267* 172*  --   BUN 43* 38* 29*  --   CREATININE 1.45* 1.39* 1.24 1.33*  CALCIUM 8.1* 8.0* 8.1*  --     CBC:  Recent Labs Lab 05/10/15 0503 05/11/15 0500 05/20/15 0429  WBC 9.6 9.2 8.4  NEUTROABS  --   --  7.3*  HGB 10.9* 11.1* 10.1*  HCT 32.8* 33.4* 30.6*  MCV 81.7 82.3 80.2  PLT 260 247 274   Cardiac Enzymes:  Recent Labs Lab 05/11/15 1532 05/11/15 2107 05/12/15 0337  TROPONINI 0.07* 0.06* 0.05*    CBG:  Recent Labs Lab 05/15/15 1109 05/15/15 1635 05/15/15 2110 05-20-15 0729 05/20/15 1152  GLUCAP 103* 284* 332* 383* 222*       Studies: Mr Kizzie Fantasia Contrast  05-20-2015  CLINICAL DATA:  Acute encephalopathy.  Mental status change. EXAM:  MRI HEAD WITHOUT AND WITH CONTRAST TECHNIQUE: Multiplanar, multiecho pulse sequences of the brain and surrounding structures were obtained without and with intravenous contrast. CONTRAST:  42mL MULTIHANCE GADOBENATE DIMEGLUMINE 529 MG/ML IV SOLN COMPARISON:  None. FINDINGS: Cerebral volume and ventricles normal for age. Negative for hydrocephalus Negative for acute infarct. Scattered small white matter hyperintensities bilaterally consistent with chronic microvascular ischemia. Basal ganglia and brainstem intact. Negative for intracranial hemorrhage. Negative for mass or edema. No shift of the midline structures. Pituitary not enlarged.  Normal skullbase. Postcontrast  imaging reveals normal enhancement. No enhancing mass lesion. Vascular enhancement is normal. Paranasal sinuses clear.  Normal orbital structures. IMPRESSION: No acute intracranial abnormality. Mild chronic microvascular ischemia. Electronically Signed   By: Franchot Gallo M.D.   On: 05/16/2015 10:24   Dg Chest Port 1 View  05/16/2015  CLINICAL DATA:  PICC line placement EXAM: PORTABLE CHEST 1 VIEW COMPARISON:  05/16/2015 FINDINGS: The heart size and vascular pattern are normal. Mild bibasilar atelectasis. Right PICC line has been placed with tip 5 cm above the cavoatrial junction. No pneumothorax. IMPRESSION: PICC line as described Electronically Signed   By: Skipper Cliche M.D.   On: 05/16/2015 16:05   Dg Chest Port 1 View  05/16/2015  CLINICAL DATA:  PICC EXAM: PORTABLE CHEST 1 VIEW COMPARISON:  Earlier today FINDINGS: Right upper extremity PICC traverses into the right jugular vein. The tip projects beyond the upper limit of the study. Lungs under aerated with basilar atelectasis. Normal heart size. IMPRESSION: Right upper extremity PICC now traverses into the right jugular vein. Electronically Signed   By: Marybelle Killings M.D.   On: 05/16/2015 16:02   Dg Chest Port 1 View  05/16/2015  CLINICAL DATA:  PICC placed EXAM: PORTABLE CHEST 1 VIEW COMPARISON:  05/13/2015 FINDINGS: Right upper extremity PICC placed. It is coiled in IJ but traverses into the right innominate vein. The tip is in the superior SVC. Normal heart size.  Lungs under aerated with basilar atelectasis. IMPRESSION: Right upper extremity PICC with its tip at the upper SVC. Bibasilar atelectasis. Electronically Signed   By: Marybelle Killings M.D.   On: 05/16/2015 14:46    Scheduled Meds: . amiodarone  400 mg Oral Daily  . apixaban  10 mg Oral BID  . [START ON 05/21/2015] apixaban  5 mg Oral BID  . aspirin EC  81 mg Oral Daily  . atorvastatin  40 mg Oral q1800  . budesonide (PULMICORT) nebulizer solution  0.25 mg Nebulization BID  .  cefTRIAXone (ROCEPHIN)  IV  2 g Intravenous Q24H  . docusate sodium  100 mg Oral BID  . doxazosin  8 mg Oral Daily  . insulin aspart  0-15 Units Subcutaneous TID AC & HS  . insulin aspart  4 Units Subcutaneous TID WC  . insulin detemir  25 Units Subcutaneous QHS  . lidocaine  1 patch Transdermal Q24H  . metoprolol tartrate  25 mg Oral BID  . nystatin  5 mL Oral QID  . pantoprazole  40 mg Oral Daily  . predniSONE  20 mg Oral Q breakfast  . QUEtiapine  25 mg Oral QHS    Assessment/Plan:  1. Atrial fibrillation with rapid ventricular response. Patient converted to normal sinus rhythm. Oral amiodarone. Continue the oral metoprolol. 2. Acute encephalopathy- this has improved. MRI brain negative 3. Streptococcus mutans sepsis. A TEE is negative for endocarditis. Patient on IV Rocephin and will continue this through 05/24/2015. PICC line to be placed today. Potential home tomorrow  4. Pulmonary embolism seen on CT scan- on eliquis. 5. Low back pain with right-sided sciatica. Oral pain medications. We'll give oral dilaudid. Try Lidoderm patch. Restart prednisone. 6. Hyperlipidemia unspecified on statin 7. Type 2 diabetes - restart low-dose Levemir and sliding scale 8. Acute on chronic kidney disease- continue to monitor 9. Essential hypertension blood pressure currently stable. 10. Acute respiratory failure yesterday- now improved and breathing comfortably on room air. Dose of Lasix for possible fluid overload  11. Thrush- nystatin swish and swallow  Code Status:     Code Status Orders        Start     Ordered   05/06/15 2348  Full code   Continuous     05/06/15 2347    Code Status History    Date Active Date Inactive Code Status Order ID Comments User Context   This patient has a current code status but no historical code status.     Family Communication: Spoke with wife at the bedside. Disposition Plan: To be determined  Consultants:  Infectious  disease  Cardiology  Antibiotics:  Rocephin  Time spent: 25 minutes  Loletha Grayer  [facility]

## 2015-05-16 NOTE — Progress Notes (Signed)
PT Cancellation Note  Patient Details Name: Eric Glover MRN: [id number] DOB: Nov 27, 1945   Cancelled Treatment:    Reason Eval/Treat Not Completed: Other (comment). Pt out of room for MRI. Discussed with RN and will re-attempt in PM.   Michiel Sivley 05/16/2015, 10:23 AM  Greggory Stallion, PT, DPT [phone]

## 2015-05-17 LAB — GLUCOSE, CAPILLARY
GLUCOSE-CAPILLARY: 257 mg/dL — AB (ref 65–99)
Glucose-Capillary: 156 mg/dL — ABNORMAL HIGH (ref 65–99)

## 2015-05-17 MED ORDER — METOPROLOL TARTRATE 25 MG PO TABS: 25.0000 mg | ORAL_TABLET | Freq: Two times a day (BID) | ORAL | Status: DC

## 2015-05-17 MED ORDER — POLYETHYLENE GLYCOL 3350 17 G PO PACK: 17.0000 g | PACK | Freq: Every day | ORAL | Status: DC | PRN

## 2015-05-17 MED ORDER — NYSTATIN 100000 UNIT/ML MT SUSP: 5.0000 mL | Freq: Four times a day (QID) | OROMUCOSAL | Status: DC

## 2015-05-17 MED ORDER — HYDROMORPHONE HCL 2 MG PO TABS: 2.0000 mg | ORAL_TABLET | Freq: Four times a day (QID) | ORAL | Status: DC | PRN

## 2015-05-17 MED ORDER — APIXABAN 5 MG PO TABS: ORAL_TABLET | ORAL | Status: DC

## 2015-05-17 MED ORDER — INSULIN DETEMIR 100 UNIT/ML ~~LOC~~ SOLN: 25.0000 [IU] | Freq: Every day | SUBCUTANEOUS | Status: DC

## 2015-05-17 MED ORDER — FUROSEMIDE 20 MG PO TABS
20.0000 mg | ORAL_TABLET | Freq: Once | ORAL | Status: AC
  Administered 2015-05-17: 20 mg via ORAL
  Filled 2015-05-17: qty 1

## 2015-05-17 MED ORDER — PREDNISONE 5 MG PO TABS: ORAL_TABLET | ORAL | Status: DC

## 2015-05-17 MED ORDER — DEXTROSE 5 % IV SOLN
2.0000 g | INTRAVENOUS | Status: DC
  Administered 2015-05-17: 2 g via INTRAVENOUS
  Filled 2015-05-17: qty 2

## 2015-05-17 MED ORDER — DEXTROSE 5 % IV SOLN: 2.0000 g | INTRAVENOUS | Status: DC

## 2015-05-17 MED ORDER — LIDOCAINE 5 % EX PTCH: 1.0000 | MEDICATED_PATCH | CUTANEOUS | Status: AC

## 2015-05-17 MED ORDER — AMIODARONE HCL 200 MG PO TABS: ORAL_TABLET | ORAL | Status: DC

## 2015-05-17 NOTE — Discharge Instructions (Signed)
picc line care as per protocol

## 2015-05-17 NOTE — Care Management (Signed)
Patient for discharge home today with home health through [facility].  Patient received his dose of IV Rocephin at 3pm and [facility] will see patient 2/23 to begin instruction of administration.  Wife requested aide.  Added Aide and OT to home health referral

## 2015-05-17 NOTE — Discharge Summary (Signed)
[facility] NAME: Eric Glover    MR#:  [id number]  DATE OF BIRTH:  03-Jan-1946  DATE OF ADMISSION:  05/06/2015 ADMITTING PHYSICIAN: Lance Coon, MD  DATE OF DISCHARGE: 05/17/2015  PRIMARY CARE PHYSICIAN: Ashok Norris, MD    ADMISSION DIAGNOSIS:  Acute back pain [M54.9] Acute renal failure, unspecified acute renal failure type ([place]) [N17.9]  DISCHARGE DIAGNOSIS:  Principal Problem:   Acute on chronic kidney failure ([place]) Active Problems:   HLD (hyperlipidemia)   Type 2 diabetes mellitus (HCC)   HTN (hypertension)   Low back pain   Acute on chronic renal failure (HCC)   Fever   DDD (degenerative disc disease), lumbar   Sciatica associated with disorder of lumbar spine   SECONDARY DIAGNOSIS:   Past Medical History  Diagnosis Date  . Diabetes mellitus without complication ([place])   . GERD (gastroesophageal reflux disease)   . Hyperlipidemia   . Hypertension   . CKD (chronic kidney disease), stage III   . Gout     HOSPITAL COURSE:   1. Streptococcus mutation sepsis. A TEE was negative for endocarditis. Patient will be treated with 2 weeks worth of IV Rocephin from the negative culture date through 05/24/2015. PICC line placed yesterday. Patient will follow-up with Dr. Ola Spurr infectious disease as outpatient and prior to antibiotics being finished. If the patient is still having severe back pain he will MRI of the back again as outpatient. Home health set up. Patient will receive 3 PM dose of antibiotic here and then go home with home health this evening. 2. Atrial fibrillation with rapid ventricular response. Patient converted over to normal sinus rhythm patient is on oral amiodarone. The patient required IV amiodarone and metoprolol and even Cardizem to control heart rate during the hospital course. Metoprolol will be continued but Cardizem was stopped. 3. Acute encephalopathy. This has improved.  Patient needed Seroquel at night in the hospital asleep. I will discontinue that as outpatient. MRI of the brain was negative. 4. Pulmonary embolism seen on CT scan- Eliquis started. Benefits and risks of blood thinner explained to the patient. 5. Low back pain with right-sided sciatica. Quick prednisone taper. Lidoderm patch. Oral dilaudid when necessary. Outpatient physical therapy. 6. Hyperlipidemia unspecified on statin 7. Type 2 diabetes on low dose Lantus 8. Acute on chronic kidney disease 9. Essential hypertension blood pressure currently stable 10. Acute respiratory failure. This happened with his acute encephalopathy. Now he is breathing comfortably on room air. I did give a dose of Lasix for possible fluid overload. Continue his home low 6 at home. 11. Thrush nystatin swish and swallow.  DISCHARGE CONDITIONS:   Satisfactory  CONSULTS OBTAINED:  Treatment Team:  Adrian Prows, MD Teodoro Spray, MD  DRUG ALLERGIES:   Allergies  Allergen Reactions  . Neomycin-Bacitracin Zn-Polymyx Rash    DISCHARGE MEDICATIONS:   Current Discharge Medication List    START taking these medications   Details  amiodarone (PACERONE) 200 MG tablet 2 tabs daily for four days then one tab daily afterwards Qty: 34 tablet, Refills: 0    apixaban (ELIQUIS) 5 MG TABS tablet 2 tabs oral  twice a day for four more days then 1 tab twice a day Qty: 60 tablet, Refills: 0    cefTRIAXone 2 g in dextrose 5 % 50 mL Inject 2 g into the vein daily. Qty: 7 Dose, Refills: 0    HYDROmorphone (DILAUDID) 2 MG tablet Take 1 tablet (2 mg  total) by mouth every 6 (six) hours as needed for severe pain. Qty: 30 tablet, Refills: 0    lidocaine (LIDODERM) 5 % Place 1 patch onto the skin daily. Remove & Discard patch within 12 hours or as directed by MD Qty: 30 patch, Refills: 0    metoprolol tartrate (LOPRESSOR) 25 MG tablet Take 1 tablet (25 mg total) by mouth 2 (two) times daily. Qty: 60 tablet, Refills: 0     nystatin (MYCOSTATIN) 100000 UNIT/ML suspension Take 5 mLs (500,000 Units total) by mouth 4 (four) times daily. Qty: 60 mL, Refills: 0    polyethylene glycol (MIRALAX / GLYCOLAX) packet Take 17 g by mouth daily as needed (constipation). Qty: 30 each, Refills: 0    predniSONE (DELTASONE) 5 MG tablet Take 3 tabs day1; 2 tabs po day2,3; 1 tab po day4,5 Qty: 9 tablet, Refills: 0      CONTINUE these medications which have CHANGED   Details  insulin detemir (LEVEMIR) 100 UNIT/ML injection Inject 0.25 mLs (25 Units total) into the skin at bedtime. Qty: 10 mL, Refills: 11      CONTINUE these medications which have NOT CHANGED   Details  allopurinol (ZYLOPRIM) 100 MG tablet Take 100 mg by mouth.    aspirin EC 81 MG tablet Take 81 mg by mouth.    beclomethasone (QVAR) 80 MCG/ACT inhaler Inhale 2 puffs into the lungs 2 (two) times daily. Qty: 1 Inhaler, Refills: 12   Associated Diagnoses: Bronchitis with bronchospasm    colchicine (COLCRYS) 0.6 MG tablet Take 0.6 mg by mouth daily as needed (gout flair).     doxazosin (CARDURA) 8 MG tablet Take 8 mg by mouth at bedtime.     fluticasone (FLONASE) 50 MCG/ACT nasal spray Place 2 sprays into both nostrils daily. Qty: 16 g, Refills: 6   Associated Diagnoses: Acute recurrent maxillary sinusitis; Bronchitis    furosemide (LASIX) 20 MG tablet Take 20 mg by mouth daily.     lansoprazole (PREVACID) 15 MG capsule Take 15 mg by mouth daily.     simvastatin (ZOCOR) 80 MG tablet Take 80 mg by mouth daily at 6 PM.     vitamin B-12 (CYANOCOBALAMIN) 500 MCG tablet Take 500 mcg by mouth daily.       STOP taking these medications     HYDROcodone-acetaminophen (NORCO/VICODIN) 5-325 MG tablet      lisinopril (PRINIVIL,ZESTRIL) 40 MG tablet      tiZANidine (ZANAFLEX) 4 MG tablet      traMADol (ULTRAM) 50 MG tablet      ipratropium (ATROVENT) 0.03 % nasal spray          DISCHARGE INSTRUCTIONS:   Follow-up with Dr. Ola Spurr one  week Follow-up with PMD in 2 weeks  If you experience worsening of your admission symptoms, develop shortness of breath, life threatening emergency, suicidal or homicidal thoughts you must seek medical attention immediately by calling 911 or calling your MD immediately  if symptoms less severe.  You Must read complete instructions/literature along with all the possible adverse reactions/side effects for all the Medicines you take and that have been prescribed to you. Take any new Medicines after you have completely understood and accept all the possible adverse reactions/side effects.   Please note  You were cared for by a hospitalist during your hospital stay. If you have any questions about your discharge medications or the care you received while you were in the hospital after you are discharged, you can call the unit and asked to speak  with the hospitalist on call if the hospitalist that took care of you is not available. Once you are discharged, your primary care physician will handle any further medical issues. Please note that NO REFILLS for any discharge medications will be authorized once you are discharged, as it is imperative that you return to your primary care physician (or establish a relationship with a primary care physician if you do not have one) for your aftercare needs so that they can reassess your need for medications and monitor your lab values.    Today   CHIEF COMPLAINT:   Chief Complaint  Patient presents with  . Back Pain    lower right to hip and some on left hipct scan at hospital     HISTORY OF PRESENT ILLNESS:  Deanna Hanish  is a 70 y.o. male presented with back pain and found to have sepsis with Streptococcus mutations. Patient had a recent dental procedure.   VITAL SIGNS:  Blood pressure 151/68, pulse 62, temperature 98.3 F (36.8 C), temperature source Oral, resp. rate 18, height 6\' 2"  (1.88 m), weight 107.049 kg (236 lb), SpO2 95 %.    PHYSICAL  EXAMINATION:  GENERAL:  70 y.o.-year-old patient lying in the bed with no acute distress.  EYES: Pupils equal, round, reactive to light and accommodation. No scleral icterus. Extraocular muscles intact.  HEENT: Head atraumatic, normocephalic. Oropharynx and nasopharynx clear.  NECK:  Supple, no jugular venous distention. No thyroid enlargement, no tenderness.  LUNGS: Normal breath sounds bilaterally, no wheezing, rales,rhonchi or crepitation. No use of accessory muscles of respiration.  CARDIOVASCULAR: S1, S2 normal. No murmurs, rubs, or gallops.  ABDOMEN: Soft, non-tender, non-distended. Bowel sounds present. No organomegaly or mass.  EXTREMITIES: No pedal edema, cyanosis, or clubbing.  NEUROLOGIC: Cranial nerves II through XII are intact. Muscle strength 5/5 in all extremities. Sensation intact. Gait not checked.  PSYCHIATRIC: The patient is alert and oriented x 3.  SKIN: No obvious rash, lesion, or ulcer.   DATA REVIEW:   CBC  Recent Labs Lab 05/16/15 0429  WBC 8.4  HGB 10.1*  HCT 30.6*  PLT 274    Chemistries   Recent Labs Lab 05/13/15 0714 05/16/15 0429  NA 137  --   K 4.4  --   CL 106  --   CO2 24  --   GLUCOSE 172*  --   BUN 29*  --   CREATININE 1.24 1.33*  CALCIUM 8.1*  --     Cardiac Enzymes  Recent Labs Lab 05/12/15 0337  TROPONINI 0.05*    Microbiology Results  Results for orders placed or performed during the hospital encounter of 05/06/15  CULTURE, BLOOD (ROUTINE X 2) w Reflex to PCR ID Panel     Status: None   Collection Time: 05/08/15  9:34 AM  Result Value Ref Range Status   Specimen Description BLOOD LEFT ARM  Final   Special Requests   Final    BOTTLES DRAWN AEROBIC AND ANAEROBIC 7ML ARE 5ML ANA   Culture  Setup Time   Final    GRAM POSITIVE COCCI IN BOTH AEROBIC AND ANAEROBIC BOTTLES CRITICAL RESULT CALLED TO, READ BACK BY AND VERIFIED WITH: [place] ON 2/14 17 MLZ    Culture   Final    STREPTOCOCCUS MUTANS IN BOTH  AEROBIC AND ANAEROBIC BOTTLES    Report Status 05/12/2015 FINAL  Final   Organism ID, Bacteria STREPTOCOCCUS MUTANS  Final      Susceptibility   Streptococcus  mutans - MIC*    ERYTHROMYCIN Value in next row Sensitive      SENSITIVE<=0.12    LEVOFLOXACIN Value in next row Sensitive      SENSITIVE2    VANCOMYCIN Value in next row Sensitive      SENSITIVE1    * STREPTOCOCCUS MUTANS  CULTURE, BLOOD (ROUTINE X 2) w Reflex to PCR ID Panel     Status: None   Collection Time: 05/08/15  9:34 AM  Result Value Ref Range Status   Specimen Description BLOOD RT ARM  Final   Special Requests   Final    BOTTLES DRAWN AEROBIC AND ANAEROBIC 6ML ANA 9ML AER   Culture  Setup Time   Final    GRAM POSITIVE COCCI IN BOTH AEROBIC AND ANAEROBIC BOTTLES CRITICAL RESULT CALLED TO, READ BACK BY AND VERIFIED WITH: JASON ROBBINS AT 1949 ON 05/09/15 MLZ    Culture   Final    STREPTOCOCCUS MUTANS IN BOTH AEROBIC AND ANAEROBIC BOTTLES    Report Status 05/12/2015 FINAL  Final   Organism ID, Bacteria STREPTOCOCCUS MUTANS  Final      Susceptibility   Streptococcus mutans - MIC*    ERYTHROMYCIN Value in next row Sensitive      SENSITIVE<=0.12    LEVOFLOXACIN Value in next row Sensitive      SENSITIVE2    VANCOMYCIN Value in next row Sensitive      SENSITIVE1    * STREPTOCOCCUS MUTANS  Blood Culture ID Panel (Reflexed)     Status: Abnormal   Collection Time: 05/08/15  9:34 AM  Result Value Ref Range Status   Enterococcus species NOT DETECTED NOT DETECTED Final   Listeria monocytogenes NOT DETECTED NOT DETECTED Final   Staphylococcus species NOT DETECTED NOT DETECTED Final   Staphylococcus aureus NOT DETECTED NOT DETECTED Final   Streptococcus species DETECTED (A) NOT DETECTED Final    Comment: CRITICAL RESULT CALLED TO, READ BACK BY AND VERIFIED WITH: CALLED TO JASON ROBBINS AT 1949 ON 2/14 17 MLZ    Streptococcus agalactiae NOT DETECTED NOT DETECTED Final   Streptococcus pneumoniae NOT DETECTED NOT  DETECTED Final   Streptococcus pyogenes NOT DETECTED NOT DETECTED Final   Acinetobacter baumannii NOT DETECTED NOT DETECTED Final   Enterobacteriaceae species NOT DETECTED NOT DETECTED Final   Enterobacter cloacae complex NOT DETECTED NOT DETECTED Final   Escherichia coli NOT DETECTED NOT DETECTED Final   Klebsiella oxytoca NOT DETECTED NOT DETECTED Final   Klebsiella pneumoniae NOT DETECTED NOT DETECTED Final   Proteus species NOT DETECTED NOT DETECTED Final   Serratia marcescens NOT DETECTED NOT DETECTED Final   Haemophilus influenzae NOT DETECTED NOT DETECTED Final   Neisseria meningitidis NOT DETECTED NOT DETECTED Final   Pseudomonas aeruginosa NOT DETECTED NOT DETECTED Final   Candida albicans NOT DETECTED NOT DETECTED Final   Candida glabrata NOT DETECTED NOT DETECTED Final   Candida krusei NOT DETECTED NOT DETECTED Final   Candida parapsilosis NOT DETECTED NOT DETECTED Final   Candida tropicalis NOT DETECTED NOT DETECTED Final   Carbapenem resistance NOT DETECTED NOT DETECTED Final   Methicillin resistance NOT DETECTED NOT DETECTED Final   Vancomycin resistance NOT DETECTED NOT DETECTED Final  Culture, blood (Routine X 2) w Reflex to ID Panel     Status: None   Collection Time: 05/10/15  3:33 PM  Result Value Ref Range Status   Specimen Description BLOOD RIGHT AC  Final   Special Requests BOTTLES DRAWN AEROBIC AND ANAEROBIC 4ML  Final  Culture NO GROWTH 5 DAYS  Final   Report Status 05/15/2015 FINAL  Final  Culture, blood (Routine X 2) w Reflex to ID Panel     Status: None   Collection Time: 05/10/15  3:33 PM  Result Value Ref Range Status   Specimen Description BLOOD LEFT AC  Final   Special Requests   Final    BOTTLES DRAWN AEROBIC AND ANAEROBIC AER 10ML ANA 4ML   Culture NO GROWTH 5 DAYS  Final   Report Status 05/15/2015 FINAL  Final  CULTURE, BLOOD (ROUTINE X 2) w Reflex to PCR ID Panel     Status: None (Preliminary result)   Collection Time: 05/13/15  1:08 PM   Result Value Ref Range Status   Specimen Description BLOOD RIGHT ARM  Final   Special Requests BOTTLES DRAWN AEROBIC AND ANAEROBIC [place]  Final   Culture NO GROWTH 4 DAYS  Final   Report Status PENDING  Incomplete  CULTURE, BLOOD (ROUTINE X 2) w Reflex to PCR ID Panel     Status: None (Preliminary result)   Collection Time: 05/13/15  1:19 PM  Result Value Ref Range Status   Specimen Description BLOOD LEFT HAND  Final   Special Requests BOTTLES DRAWN AEROBIC AND ANAEROBIC [place]  Final   Culture NO GROWTH 4 DAYS  Final   Report Status PENDING  Incomplete    RADIOLOGY:  Mr Kizzie Fantasia Contrast  05/16/2015  CLINICAL DATA:  Acute encephalopathy.  Mental status change. EXAM: MRI HEAD WITHOUT AND WITH CONTRAST TECHNIQUE: Multiplanar, multiecho pulse sequences of the brain and surrounding structures were obtained without and with intravenous contrast. CONTRAST:  55mL MULTIHANCE GADOBENATE DIMEGLUMINE 529 MG/ML IV SOLN COMPARISON:  None. FINDINGS: Cerebral volume and ventricles normal for age. Negative for hydrocephalus Negative for acute infarct. Scattered small white matter hyperintensities bilaterally consistent with chronic microvascular ischemia. Basal ganglia and brainstem intact. Negative for intracranial hemorrhage. Negative for mass or edema. No shift of the midline structures. Pituitary not enlarged.  Normal skullbase. Postcontrast imaging reveals normal enhancement. No enhancing mass lesion. Vascular enhancement is normal. Paranasal sinuses clear.  Normal orbital structures. IMPRESSION: No acute intracranial abnormality. Mild chronic microvascular ischemia. Electronically Signed   By: Franchot Gallo M.D.   On: 05/16/2015 10:24   Dg Chest Port 1 View  05/16/2015  CLINICAL DATA:  PICC line placement EXAM: PORTABLE CHEST 1 VIEW COMPARISON:  05/16/2015 FINDINGS: The heart size and vascular pattern are normal. Mild bibasilar atelectasis. Right PICC line has been placed with tip 5 cm above the cavoatrial  junction. No pneumothorax. IMPRESSION: PICC line as described Electronically Signed   By: Skipper Cliche M.D.   On: 05/16/2015 16:05   Dg Chest Port 1 View  05/16/2015  CLINICAL DATA:  PICC EXAM: PORTABLE CHEST 1 VIEW COMPARISON:  Earlier today FINDINGS: Right upper extremity PICC traverses into the right jugular vein. The tip projects beyond the upper limit of the study. Lungs under aerated with basilar atelectasis. Normal heart size. IMPRESSION: Right upper extremity PICC now traverses into the right jugular vein. Electronically Signed   By: Marybelle Killings M.D.   On: 05/16/2015 16:02   Dg Chest Port 1 View  05/16/2015  CLINICAL DATA:  PICC placed EXAM: PORTABLE CHEST 1 VIEW COMPARISON:  05/13/2015 FINDINGS: Right upper extremity PICC placed. It is coiled in IJ but traverses into the right innominate vein. The tip is in the superior SVC. Normal heart size.  Lungs under aerated with basilar atelectasis. IMPRESSION: Right  upper extremity PICC with its tip at the upper SVC. Bibasilar atelectasis. Electronically Signed   By: Marybelle Killings M.D.   On: 05/16/2015 14:46    Management plans discussed with the patient, family and they are in agreement.  CODE STATUS:     Code Status Orders        Start     Ordered   05/06/15 2348  Full code   Continuous     05/06/15 2347    Code Status History    Date Active Date Inactive Code Status Order ID Comments User Context   This patient has a current code status but no historical code status.      TOTAL TIME TAKING CARE OF THIS PATIENT: 35 minutes.    Loletha Grayer M.D on 05/17/2015 at 4:35 PM  Between 7am to 6pm - Pager - [phone]  After 6pm go to [email or web address] - password [email or web address] [facility]  Office  [phone]  CC: Primary care physician; Ashok Norris, MD

## 2015-05-17 NOTE — Progress Notes (Signed)
Pt is a&o, VSS, NSR on tele with a complaint of back pain in which PRN med and Lidocaine patch helped. Order to d/c pt to home post 3pm abx with PICC in place for home iv abx therapy. [place] set up by CM and 3pm dose of iv abx given to pt. Discharge instructions and prescriptions given to pt and wife with verbal acknowledgment of understanding. PIV and tele removed and pt awaiting wife to go get car for d/c.

## 2015-05-17 NOTE — Progress Notes (Addendum)
Inpatient Diabetes Program Recommendations  AACE/ADA: New Consensus Statement on Inpatient Glycemic Control (2015)  Target Ranges:  Prepandial:   less than 140 mg/dL      Peak postprandial:   less than 180 mg/dL (1-2 hours)      Critically ill patients:  140 - 180 mg/dL   Results for TREAVON, SALTERS (MRN [id number]) as of 05/17/2015 12:35  Ref. Range 05/16/2015 07:29 05/16/2015 11:52 05/16/2015 16:30 05/16/2015 21:21  Glucose-Capillary Latest Ref Range: 65-99 mg/dL 383 (H) 222 (H) 310 (H) 342 (H)   Results for DOYNE, BARCROFT (MRN [id number]) as of 05/17/2015 12:35  Ref. Range 05/17/2015 07:34 05/17/2015 11:43  Glucose-Capillary Latest Ref Range: 65-99 mg/dL 156 (H) 257 (H)    Home Diabetes meds: Levemir 40 units q HS  Current Insulin Orders: Novolog 4 units tid with meals  Novolog Moderate Correction Scale/ SSI (0-15 units) tidwc and at bedtime      Levemir 25 units QHS     MD- Note fasting glucose much better controlled on higher dose of Levemir (25 units QHS).  Patient still having issues with elevated postprandial glucose levels.  Eating 100% of meals.  Please consider increasing Novolog Meal Coverage to 6 units tidwc     --Will follow patient during hospitalization--  Wyn Quaker RN, MSN, CDE Diabetes Coordinator Inpatient Glycemic Control Team Team Pager: [phone] (8a-5p)

## 2015-05-18 LAB — CULTURE, BLOOD (ROUTINE X 2)
CULTURE: NO GROWTH
Culture: NO GROWTH

## 2015-05-24 ENCOUNTER — Ambulatory Visit
Admission: RE | Admit: 2015-05-24 | Discharge: 2015-05-24 | Disposition: A | Discharge status: Home / Self Care | Payer: Federal, State, Local not specified - PPO | Source: Ambulatory Visit | Attending: Infectious Diseases | Admitting: Infectious Diseases

## 2015-05-24 ENCOUNTER — Other Ambulatory Visit: Payer: Self-pay | Admitting: Infectious Diseases

## 2015-05-24 DIAGNOSIS — M4686 Other specified inflammatory spondylopathies, lumbar region: Secondary | ICD-10-CM | POA: Insufficient documentation

## 2015-05-24 DIAGNOSIS — M5441 Lumbago with sciatica, right side: Secondary | ICD-10-CM | POA: Insufficient documentation

## 2015-05-24 DIAGNOSIS — R7881 Bacteremia: Secondary | ICD-10-CM | POA: Diagnosis present

## 2015-05-24 DIAGNOSIS — Z792 Long term (current) use of antibiotics: Secondary | ICD-10-CM | POA: Insufficient documentation

## 2015-05-30 ENCOUNTER — Ambulatory Visit: Payer: Federal, State, Local not specified - PPO | Admitting: Family Medicine

## 2015-05-30 ENCOUNTER — Encounter: Payer: Self-pay | Admitting: Family Medicine

## 2015-05-30 ENCOUNTER — Ambulatory Visit (INDEPENDENT_AMBULATORY_CARE_PROVIDER_SITE_OTHER): Payer: Federal, State, Local not specified - PPO | Admitting: Family Medicine

## 2015-05-30 VITALS — BP 160/84 | HR 62 | Temp 98.1°F | Resp 20 | Ht 74.0 in | Wt 206.8 lb

## 2015-05-30 DIAGNOSIS — A499 Bacterial infection, unspecified: Secondary | ICD-10-CM | POA: Diagnosis not present

## 2015-05-30 DIAGNOSIS — N189 Chronic kidney disease, unspecified: Secondary | ICD-10-CM

## 2015-05-30 DIAGNOSIS — E1140 Type 2 diabetes mellitus with diabetic neuropathy, unspecified: Secondary | ICD-10-CM

## 2015-05-30 DIAGNOSIS — Z794 Long term (current) use of insulin: Secondary | ICD-10-CM

## 2015-05-30 DIAGNOSIS — M549 Dorsalgia, unspecified: Secondary | ICD-10-CM

## 2015-05-30 DIAGNOSIS — I4891 Unspecified atrial fibrillation: Secondary | ICD-10-CM

## 2015-05-30 DIAGNOSIS — M4620 Osteomyelitis of vertebra, site unspecified: Secondary | ICD-10-CM | POA: Diagnosis not present

## 2015-05-30 DIAGNOSIS — N179 Acute kidney failure, unspecified: Secondary | ICD-10-CM | POA: Diagnosis not present

## 2015-05-30 DIAGNOSIS — Z09 Encounter for follow-up examination after completed treatment for conditions other than malignant neoplasm: Secondary | ICD-10-CM

## 2015-05-30 DIAGNOSIS — R7881 Bacteremia: Secondary | ICD-10-CM

## 2015-05-30 DIAGNOSIS — M4646 Discitis, unspecified, lumbar region: Secondary | ICD-10-CM

## 2015-05-30 MED ORDER — FENTANYL 25 MCG/HR TD PT72: 25.0000 ug | MEDICATED_PATCH | TRANSDERMAL | Status: DC

## 2015-05-30 NOTE — Progress Notes (Signed)
Name: Eric Glover   MRN: [id number]    DOB: July 11, 1945   Date:05/30/2015       Progress Note  Subjective  Chief Complaint  Chief Complaint  Patient presents with  . Hospitalization Follow-up  . Back Pain    HPI  Hospital Follow up: he was admitted on Feb 11th, 2017 with severe low back pain. He had acute on chronic renal failure, encephalopathy, rapid afib, and streptococcus sepsis. He was in the hospital for 11 days and was discharged home 2/22 with a pic line and getting Rocephin at home. He has home health. Seen by Dr. Rolla Flatten on March 1st and back pain was not improving and MRI showed discitis , osteomyelitis of vertebral body and also early phlegmon formation.  He is still having a lot of pain, no fever or chills. Appetite is improving.   Afib: new onset during hospitalization, now seen by cardiologist since discharge, taking medication as prescribed. We will scheduled follow up with his cardiologist, Dr. Clayborn Bigness.   DMII: glucose has been elevated because he just finished steroids, glucose is still above 200 fasting. Currently on Levemir 40 units advised to increase by 3 units q 3 days to get fasting between 100-140 fasting   Patient Active Problem List   Diagnosis Date Noted  . A-fib ([place]) 05/30/2015  . Bacteremia due to Gram-positive bacteria 05/24/2015  . DDD (degenerative disc disease), lumbar 05/09/2015  . Sciatica associated with disorder of lumbar spine 05/09/2015  . Fever 05/08/2015  . HTN (hypertension) 05/06/2015  . Acute on chronic kidney failure ([place]) 05/06/2015  . Low back pain 05/06/2015  . Diabetes mellitus with neuropathy ([place]) 01/11/2015  . Benign hypertensive kidney disease 05/31/2010  . Chronic kidney disease (CKD), stage III (moderate) 05/31/2010  . Gout 05/31/2010  . HLD (hyperlipidemia) 05/31/2010  . Type 2 diabetes mellitus ([place]) 05/31/2010    Past Surgical History  Procedure Laterality Date  . Shoulder arthroscopy w/ rotator cuff repair     . Tee without cardioversion N/A 05/15/2015    Procedure: TRANSESOPHAGEAL ECHOCARDIOGRAM (TEE);  Surgeon: Teodoro Spray, MD;  Location: [facility] ORS;  Service: Cardiovascular;  Laterality: N/A;    Family History  Problem Relation Age of Onset  . Alcohol abuse Paternal Uncle   . Early death Paternal Uncle   . Arthritis Mother   . Cancer Mother   . Diabetes Mother   . Hearing loss Mother   . Heart disease Mother   . Hyperlipidemia Mother   . Hypertension Mother   . Kidney disease Mother   . Diabetes Father   . Hearing loss Father     Social History   Social History  . Marital Status: Married    Spouse Name: N/A  . Number of Children: N/A  . Years of Education: N/A   Occupational History  . Not on file.   Social History Main Topics  . Smoking status: Never Smoker   . Smokeless tobacco: Not on file  . Alcohol Use: No  . Drug Use: No  . Sexual Activity: Not on file   Other Topics Concern  . Not on file   Social History Narrative     Current outpatient prescriptions:  .  allopurinol (ZYLOPRIM) 100 MG tablet, Take 100 mg by mouth., Disp: , Rfl:  .  amiodarone (PACERONE) 200 MG tablet, 2 tabs daily for four days then one tab daily afterwards, Disp: 34 tablet, Rfl: 0 .  apixaban (ELIQUIS) 5 MG TABS tablet, 2 tabs oral  twice a day for four more days then 1 tab twice a day, Disp: 60 tablet, Rfl: 0 .  aspirin EC 81 MG tablet, Take 81 mg by mouth., Disp: , Rfl:  .  beclomethasone (QVAR) 80 MCG/ACT inhaler, Inhale 2 puffs into the lungs 2 (two) times daily. (Patient taking differently: Inhale 2 puffs into the lungs 2 (two) times daily as needed (shortness of breath). ), Disp: 1 Inhaler, Rfl: 12 .  cefTRIAXone (ROCEPHIN) 2-2.22 GM-% IVPB, Inject into the vein., Disp: , Rfl:  .  cefTRIAXone 2 g in dextrose 5 % 50 mL, Inject 2 g into the vein daily., Disp: 7 Dose, Rfl: 0 .  colchicine (COLCRYS) 0.6 MG tablet, Take 0.6 mg by mouth daily as needed (gout flair). , Disp: , Rfl:  .   doxazosin (CARDURA) 8 MG tablet, Take 8 mg by mouth at bedtime. , Disp: , Rfl:  .  fluticasone (FLONASE) 50 MCG/ACT nasal spray, Place 2 sprays into both nostrils daily. (Patient taking differently: Place 2 sprays into both nostrils daily as needed for allergies or rhinitis. ), Disp: 16 g, Rfl: 6 .  furosemide (LASIX) 20 MG tablet, Take 20 mg by mouth daily. , Disp: , Rfl:  .  HYDROmorphone (DILAUDID) 2 MG tablet, Take 1 tablet (2 mg total) by mouth every 6 (six) hours as needed for severe pain., Disp: 30 tablet, Rfl: 0 .  insulin detemir (LEVEMIR) 100 UNIT/ML injection, Inject 0.25 mLs (25 Units total) into the skin at bedtime., Disp: 10 mL, Rfl: 11 .  lansoprazole (PREVACID) 15 MG capsule, Take 15 mg by mouth daily. , Disp: , Rfl:  .  lidocaine (LIDODERM) 5 %, Place 1 patch onto the skin daily. Remove & Discard patch within 12 hours or as directed by MD, Disp: 30 patch, Rfl: 0 .  metoprolol tartrate (LOPRESSOR) 25 MG tablet, Take 1 tablet (25 mg total) by mouth 2 (two) times daily., Disp: 60 tablet, Rfl: 0 .  nystatin (MYCOSTATIN) 100000 UNIT/ML suspension, Take 5 mLs (500,000 Units total) by mouth 4 (four) times daily., Disp: 60 mL, Rfl: 0 .  polyethylene glycol (MIRALAX / GLYCOLAX) packet, Take 17 g by mouth daily as needed (constipation)., Disp: 30 each, Rfl: 0 .  simvastatin (ZOCOR) 80 MG tablet, Take 80 mg by mouth daily at 6 PM. , Disp: , Rfl:  .  vitamin B-12 (CYANOCOBALAMIN) 500 MCG tablet, Take 500 mcg by mouth daily. , Disp: , Rfl:   Allergies  Allergen Reactions  . Neomycin-Bacitracin Zn-Polymyx Rash     ROS  Ten systems reviewed and is negative except as mentioned in HPI . He lost 30 lbs in the past month  Objective  Filed Vitals:   05/30/15 1506  BP: 160/84  Pulse: 62  Temp: 98.1 F (36.7 C)  TempSrc: Oral  Resp: 20  Height: 6' 2" (1.88 m)  Weight: 206 lb 12.8 oz (93.804 kg)  SpO2: 98%    Body mass index is 26.54 kg/(m^2).  Physical Exam  Constitutional:  Patient appears pale and in distress - secondary to pain  HEENT: head atraumatic, normocephalic, pupils equal and reactive to light, neck supple, throat within normal limits Cardiovascular: Normal rate, irregular rhythm.  No murmur heard. No BLE edema. Pulmonary/Chest: Effort normal and breath sounds normal. No respiratory distress. Abdominal: Soft.  There is no tenderness. Psychiatric: Patient has a normal mood and affect. behavior is normal. Judgment and thought content normal. Muscular skeletal: pain during palpation of lumbar spine, negative straight leg  raise.   Recent Results (from the past 2160 hour(s))  Glucose, capillary     Status: Abnormal   Collection Time: 05/06/15  3:12 PM  Result Value Ref Range   Glucose-Capillary 357 (H) 65 - 99 mg/dL  CBC with Differential     Status: Abnormal   Collection Time: 05/06/15  3:40 PM  Result Value Ref Range   WBC 10.7 (H) 3.8 - 10.6 K/uL   RBC 4.35 (L) 4.40 - 5.90 MIL/uL   Hemoglobin 11.9 (L) 13.0 - 18.0 g/dL   HCT 35.9 (L) 40.0 - 52.0 %   MCV 82.5 80.0 - 100.0 fL   MCH 27.4 26.0 - 34.0 pg   MCHC 33.3 32.0 - 36.0 g/dL   RDW 14.9 (H) 11.5 - 14.5 %   Platelets 296 150 - 440 K/uL   Neutrophils Relative % 84 %   Neutro Abs 8.9 (H) 1.4 - 6.5 K/uL   Lymphocytes Relative 7 %   Lymphs Abs 0.8 (L) 1.0 - 3.6 K/uL   Monocytes Relative 8 %   Monocytes Absolute 0.9 0.2 - 1.0 K/uL   Eosinophils Relative 1 %   Eosinophils Absolute 0.1 0 - 0.7 K/uL   Basophils Relative 0 %   Basophils Absolute 0.0 0 - 0.1 K/uL  Comprehensive metabolic panel     Status: Abnormal   Collection Time: 05/06/15  3:40 PM  Result Value Ref Range   Sodium 131 (L) 135 - 145 mmol/L   Potassium 5.5 (H) 3.5 - 5.1 mmol/L   Chloride 105 101 - 111 mmol/L   CO2 18 (L) 22 - 32 mmol/L   Glucose, Bld 331 (H) 65 - 99 mg/dL   BUN 57 (H) 6 - 20 mg/dL   Creatinine, Ser 2.16 (H) 0.61 - 1.24 mg/dL   Calcium 8.7 (L) 8.9 - 10.3 mg/dL   Total Protein 7.1 6.5 - 8.1 g/dL   Albumin 3.8  3.5 - 5.0 g/dL   AST 28 15 - 41 U/L   ALT 22 17 - 63 U/L   Alkaline Phosphatase 67 38 - 126 U/L   Total Bilirubin 1.1 0.3 - 1.2 mg/dL   GFR calc non Af Amer 29 (L) >60 mL/min   GFR calc Af Amer 34 (L) >60 mL/min    Comment: (NOTE) The eGFR has been calculated using the CKD EPI equation. This calculation has not been validated in all clinical situations. eGFR's persistently <60 mL/min signify possible Chronic Kidney Disease.    Anion gap 8 5 - 15  Urinalysis complete, with microscopic     Status: Abnormal   Collection Time: 05/06/15  3:40 PM  Result Value Ref Range   Color, Urine YELLOW (A) YELLOW   APPearance CLEAR (A) CLEAR   Glucose, UA >500 (A) NEGATIVE mg/dL   Bilirubin Urine NEGATIVE NEGATIVE   Ketones, ur TRACE (A) NEGATIVE mg/dL   Specific Gravity, Urine 1.015 1.005 - 1.030   Hgb urine dipstick NEGATIVE NEGATIVE   pH 5.0 5.0 - 8.0   Protein, ur NEGATIVE NEGATIVE mg/dL   Nitrite NEGATIVE NEGATIVE   Leukocytes, UA NEGATIVE NEGATIVE   RBC / HPF NONE SEEN 0 - 5 RBC/hpf   WBC, UA 0-5 0 - 5 WBC/hpf   Bacteria, UA NONE SEEN NONE SEEN   Squamous Epithelial / LPF 0-5 (A) NONE SEEN   Hyaline Casts, UA PRESENT   Glucose, capillary     Status: Abnormal   Collection Time: 05/06/15  8:25 PM  Result Value Ref Range  Glucose-Capillary 243 (H) 65 - 99 mg/dL  Glucose, capillary     Status: Abnormal   Collection Time: 05/07/15 12:00 AM  Result Value Ref Range   Glucose-Capillary 286 (H) 65 - 99 mg/dL   Comment 1 Notify RN   Basic metabolic panel     Status: Abnormal   Collection Time: 05/07/15  4:56 AM  Result Value Ref Range   Sodium 135 135 - 145 mmol/L   Potassium 5.3 (H) 3.5 - 5.1 mmol/L   Chloride 109 101 - 111 mmol/L   CO2 15 (L) 22 - 32 mmol/L   Glucose, Bld 307 (H) 65 - 99 mg/dL   BUN 44 (H) 6 - 20 mg/dL   Creatinine, Ser 1.75 (H) 0.61 - 1.24 mg/dL   Calcium 8.4 (L) 8.9 - 10.3 mg/dL   GFR calc non Af Amer 38 (L) >60 mL/min   GFR calc Af Amer 44 (L) >60 mL/min     Comment: (NOTE) The eGFR has been calculated using the CKD EPI equation. This calculation has not been validated in all clinical situations. eGFR's persistently <60 mL/min signify possible Chronic Kidney Disease.    Anion gap 11 5 - 15  CBC     Status: Abnormal   Collection Time: 05/07/15  4:56 AM  Result Value Ref Range   WBC 11.1 (H) 3.8 - 10.6 K/uL   RBC 4.04 (L) 4.40 - 5.90 MIL/uL   Hemoglobin 10.8 (L) 13.0 - 18.0 g/dL   HCT 33.1 (L) 40.0 - 52.0 %   MCV 81.8 80.0 - 100.0 fL   MCH 26.8 26.0 - 34.0 pg   MCHC 32.7 32.0 - 36.0 g/dL   RDW 15.3 (H) 11.5 - 14.5 %   Platelets 282 150 - 440 K/uL  Glucose, capillary     Status: Abnormal   Collection Time: 05/07/15  7:31 AM  Result Value Ref Range   Glucose-Capillary 264 (H) 65 - 99 mg/dL   Comment 1 Notify RN   Glucose, capillary     Status: Abnormal   Collection Time: 05/07/15 11:34 AM  Result Value Ref Range   Glucose-Capillary 342 (H) 65 - 99 mg/dL   Comment 1 Notify RN   Glucose, capillary     Status: Abnormal   Collection Time: 05/07/15  4:33 PM  Result Value Ref Range   Glucose-Capillary 403 (H) 65 - 99 mg/dL   Comment 1 Notify RN   Glucose, capillary     Status: Abnormal   Collection Time: 05/07/15  4:46 PM  Result Value Ref Range   Glucose-Capillary 406 (H) 65 - 99 mg/dL  Glucose, capillary     Status: Abnormal   Collection Time: 05/07/15  7:36 PM  Result Value Ref Range   Glucose-Capillary 401 (H) 65 - 99 mg/dL  Glucose, capillary     Status: Abnormal   Collection Time: 05/08/15  3:19 AM  Result Value Ref Range   Glucose-Capillary 332 (H) 65 - 99 mg/dL  Influenza panel by PCR (type A & B, H1N1)     Status: None   Collection Time: 05/08/15  6:39 AM  Result Value Ref Range   Influenza A By PCR NEGATIVE NEGATIVE   Influenza B By PCR NEGATIVE NEGATIVE   H1N1 flu by pcr NOT DETECTED NOT DETECTED    Comment:        The Xpert Flu assay (FDA approved for nasal aspirates or washes and nasopharyngeal swab specimens),  is intended as an aid in the diagnosis of influenza  and should not be used as a sole basis for treatment.   Glucose, capillary     Status: Abnormal   Collection Time: 05/08/15  7:23 AM  Result Value Ref Range   Glucose-Capillary 305 (H) 65 - 99 mg/dL  CBC     Status: Abnormal   Collection Time: 05/08/15  9:32 AM  Result Value Ref Range   WBC 15.5 (H) 3.8 - 10.6 K/uL   RBC 3.96 (L) 4.40 - 5.90 MIL/uL   Hemoglobin 10.6 (L) 13.0 - 18.0 g/dL   HCT 32.1 (L) 40.0 - 52.0 %   MCV 81.2 80.0 - 100.0 fL   MCH 26.8 26.0 - 34.0 pg   MCHC 33.1 32.0 - 36.0 g/dL   RDW 15.1 (H) 11.5 - 14.5 %   Platelets 288 150 - 440 K/uL  Basic metabolic panel     Status: Abnormal   Collection Time: 05/08/15  9:32 AM  Result Value Ref Range   Sodium 136 135 - 145 mmol/L   Potassium 4.1 3.5 - 5.1 mmol/L   Chloride 111 101 - 111 mmol/L   CO2 19 (L) 22 - 32 mmol/L   Glucose, Bld 252 (H) 65 - 99 mg/dL   BUN 46 (H) 6 - 20 mg/dL   Creatinine, Ser 2.07 (H) 0.61 - 1.24 mg/dL   Calcium 8.6 (L) 8.9 - 10.3 mg/dL   GFR calc non Af Amer 31 (L) >60 mL/min   GFR calc Af Amer 36 (L) >60 mL/min    Comment: (NOTE) The eGFR has been calculated using the CKD EPI equation. This calculation has not been validated in all clinical situations. eGFR's persistently <60 mL/min signify possible Chronic Kidney Disease.    Anion gap 6 5 - 15  CULTURE, BLOOD (ROUTINE X 2) w Reflex to PCR ID Panel     Status: None   Collection Time: 05/08/15  9:34 AM  Result Value Ref Range   Specimen Description BLOOD LEFT ARM    Special Requests      BOTTLES DRAWN AEROBIC AND ANAEROBIC 7ML ARE 5ML ANA   Culture  Setup Time      GRAM POSITIVE COCCI IN BOTH AEROBIC AND ANAEROBIC BOTTLES CRITICAL RESULT CALLED TO, READ BACK BY AND VERIFIED WITH: [place] ON 2/14 17 MLZ    Culture      STREPTOCOCCUS MUTANS IN BOTH AEROBIC AND ANAEROBIC BOTTLES    Report Status 05/12/2015 FINAL    Organism ID, Bacteria STREPTOCOCCUS MUTANS        Susceptibility   Streptococcus mutans - MIC*    ERYTHROMYCIN Value in next row Sensitive      SENSITIVE<=0.12    LEVOFLOXACIN Value in next row Sensitive      SENSITIVE2    VANCOMYCIN Value in next row Sensitive      SENSITIVE1    * STREPTOCOCCUS MUTANS  CULTURE, BLOOD (ROUTINE X 2) w Reflex to PCR ID Panel     Status: None   Collection Time: 05/08/15  9:34 AM  Result Value Ref Range   Specimen Description BLOOD RT ARM    Special Requests      BOTTLES DRAWN AEROBIC AND ANAEROBIC 6ML ANA 9ML AER   Culture  Setup Time      GRAM POSITIVE COCCI IN BOTH AEROBIC AND ANAEROBIC BOTTLES CRITICAL RESULT CALLED TO, READ BACK BY AND VERIFIED WITH: JASON ROBBINS AT 1949 ON 05/09/15 MLZ    Culture      STREPTOCOCCUS MUTANS IN BOTH AEROBIC AND  ANAEROBIC BOTTLES    Report Status 05/12/2015 FINAL    Organism ID, Bacteria STREPTOCOCCUS MUTANS       Susceptibility   Streptococcus mutans - MIC*    ERYTHROMYCIN Value in next row Sensitive      SENSITIVE<=0.12    LEVOFLOXACIN Value in next row Sensitive      SENSITIVE2    VANCOMYCIN Value in next row Sensitive      SENSITIVE1    * STREPTOCOCCUS MUTANS  Blood Culture ID Panel (Reflexed)     Status: Abnormal   Collection Time: 05/08/15  9:34 AM  Result Value Ref Range   Enterococcus species NOT DETECTED NOT DETECTED   Listeria monocytogenes NOT DETECTED NOT DETECTED   Staphylococcus species NOT DETECTED NOT DETECTED   Staphylococcus aureus NOT DETECTED NOT DETECTED   Streptococcus species DETECTED (A) NOT DETECTED    Comment: CRITICAL RESULT CALLED TO, READ BACK BY AND VERIFIED WITH: CALLED TO JASON ROBBINS AT 1949 ON 2/14 17 MLZ    Streptococcus agalactiae NOT DETECTED NOT DETECTED   Streptococcus pneumoniae NOT DETECTED NOT DETECTED   Streptococcus pyogenes NOT DETECTED NOT DETECTED   Acinetobacter baumannii NOT DETECTED NOT DETECTED   Enterobacteriaceae species NOT DETECTED NOT DETECTED   Enterobacter cloacae complex NOT DETECTED NOT  DETECTED   Escherichia coli NOT DETECTED NOT DETECTED   Klebsiella oxytoca NOT DETECTED NOT DETECTED   Klebsiella pneumoniae NOT DETECTED NOT DETECTED   Proteus species NOT DETECTED NOT DETECTED   Serratia marcescens NOT DETECTED NOT DETECTED   Haemophilus influenzae NOT DETECTED NOT DETECTED   Neisseria meningitidis NOT DETECTED NOT DETECTED   Pseudomonas aeruginosa NOT DETECTED NOT DETECTED   Candida albicans NOT DETECTED NOT DETECTED   Candida glabrata NOT DETECTED NOT DETECTED   Candida krusei NOT DETECTED NOT DETECTED   Candida parapsilosis NOT DETECTED NOT DETECTED   Candida tropicalis NOT DETECTED NOT DETECTED   Carbapenem resistance NOT DETECTED NOT DETECTED   Methicillin resistance NOT DETECTED NOT DETECTED   Vancomycin resistance NOT DETECTED NOT DETECTED  Glucose, capillary     Status: Abnormal   Collection Time: 05/08/15 11:29 AM  Result Value Ref Range   Glucose-Capillary 220 (H) 65 - 99 mg/dL  Glucose, capillary     Status: Abnormal   Collection Time: 05/08/15  4:25 PM  Result Value Ref Range   Glucose-Capillary 311 (H) 65 - 99 mg/dL  Urinalysis complete, with microscopic ([facility] only)     Status: Abnormal   Collection Time: 05/08/15  6:00 PM  Result Value Ref Range   Color, Urine YELLOW (A) YELLOW   APPearance HAZY (A) CLEAR   Glucose, UA >500 (A) NEGATIVE mg/dL   Bilirubin Urine NEGATIVE NEGATIVE   Ketones, ur TRACE (A) NEGATIVE mg/dL   Specific Gravity, Urine 1.015 1.005 - 1.030   Hgb urine dipstick 2+ (A) NEGATIVE   pH 5.0 5.0 - 8.0   Protein, ur NEGATIVE NEGATIVE mg/dL   Nitrite NEGATIVE NEGATIVE   Leukocytes, UA TRACE (A) NEGATIVE   RBC / HPF 0-5 0 - 5 RBC/hpf   WBC, UA 0-5 0 - 5 WBC/hpf   Bacteria, UA NONE SEEN NONE SEEN   Squamous Epithelial / LPF 0-5 (A) NONE SEEN  Glucose, capillary     Status: Abnormal   Collection Time: 05/08/15  9:32 PM  Result Value Ref Range   Glucose-Capillary 442 (H) 65 - 99 mg/dL  Glucose, capillary     Status: Abnormal    Collection Time: 05/08/15  9:46 PM  Result Value Ref Range   Glucose-Capillary 437 (H) 65 - 99 mg/dL  Glucose, capillary     Status: Abnormal   Collection Time: 05/09/15 12:15 AM  Result Value Ref Range   Glucose-Capillary 447 (H) 65 - 99 mg/dL   Comment 1 Notify RN   Glucose, capillary     Status: Abnormal   Collection Time: 05/09/15  3:41 AM  Result Value Ref Range   Glucose-Capillary 193 (H) 65 - 99 mg/dL  CBC     Status: Abnormal   Collection Time: 05/09/15  5:45 AM  Result Value Ref Range   WBC 8.8 3.8 - 10.6 K/uL   RBC 3.91 (L) 4.40 - 5.90 MIL/uL   Hemoglobin 10.7 (L) 13.0 - 18.0 g/dL   HCT 32.0 (L) 40.0 - 52.0 %   MCV 81.9 80.0 - 100.0 fL   MCH 27.4 26.0 - 34.0 pg   MCHC 33.4 32.0 - 36.0 g/dL   RDW 15.0 (H) 11.5 - 14.5 %   Platelets 277 150 - 440 K/uL  Basic metabolic panel     Status: Abnormal   Collection Time: 05/09/15  5:45 AM  Result Value Ref Range   Sodium 131 (L) 135 - 145 mmol/L   Potassium 4.2 3.5 - 5.1 mmol/L   Chloride 105 101 - 111 mmol/L   CO2 18 (L) 22 - 32 mmol/L   Glucose, Bld 159 (H) 65 - 99 mg/dL   BUN 47 (H) 6 - 20 mg/dL   Creatinine, Ser 1.71 (H) 0.61 - 1.24 mg/dL   Calcium 8.0 (L) 8.9 - 10.3 mg/dL   GFR calc non Af Amer 39 (L) >60 mL/min   GFR calc Af Amer 45 (L) >60 mL/min    Comment: (NOTE) The eGFR has been calculated using the CKD EPI equation. This calculation has not been validated in all clinical situations. eGFR's persistently <60 mL/min signify possible Chronic Kidney Disease.    Anion gap 8 5 - 15  Glucose, capillary     Status: Abnormal   Collection Time: 05/09/15  6:59 AM  Result Value Ref Range   Glucose-Capillary 211 (H) 65 - 99 mg/dL  Glucose, capillary     Status: Abnormal   Collection Time: 05/09/15  7:30 AM  Result Value Ref Range   Glucose-Capillary 147 (H) 65 - 99 mg/dL   Comment 1 Notify RN   Glucose, capillary     Status: Abnormal   Collection Time: 05/09/15 11:24 AM  Result Value Ref Range   Glucose-Capillary  144 (H) 65 - 99 mg/dL   Comment 1 Notify RN   Glucose, capillary     Status: Abnormal   Collection Time: 05/09/15  4:35 PM  Result Value Ref Range   Glucose-Capillary 201 (H) 65 - 99 mg/dL   Comment 1 Notify RN   Glucose, capillary     Status: Abnormal   Collection Time: 05/09/15  9:58 PM  Result Value Ref Range   Glucose-Capillary 375 (H) 65 - 99 mg/dL  CBC     Status: Abnormal   Collection Time: 05/10/15  5:03 AM  Result Value Ref Range   WBC 9.6 3.8 - 10.6 K/uL   RBC 4.02 (L) 4.40 - 5.90 MIL/uL   Hemoglobin 10.9 (L) 13.0 - 18.0 g/dL   HCT 32.8 (L) 40.0 - 52.0 %   MCV 81.7 80.0 - 100.0 fL   MCH 27.2 26.0 - 34.0 pg   MCHC 33.3 32.0 - 36.0 g/dL   RDW 15.3 (H) 11.5 -  14.5 %   Platelets 260 150 - 440 K/uL  Basic metabolic panel     Status: Abnormal   Collection Time: 05/10/15  5:03 AM  Result Value Ref Range   Sodium 135 135 - 145 mmol/L   Potassium 4.7 3.5 - 5.1 mmol/L   Chloride 110 101 - 111 mmol/L   CO2 19 (L) 22 - 32 mmol/L   Glucose, Bld 271 (H) 65 - 99 mg/dL   BUN 43 (H) 6 - 20 mg/dL   Creatinine, Ser 1.45 (H) 0.61 - 1.24 mg/dL   Calcium 8.1 (L) 8.9 - 10.3 mg/dL   GFR calc non Af Amer 48 (L) >60 mL/min   GFR calc Af Amer 55 (L) >60 mL/min    Comment: (NOTE) The eGFR has been calculated using the CKD EPI equation. This calculation has not been validated in all clinical situations. eGFR's persistently <60 mL/min signify possible Chronic Kidney Disease.    Anion gap 6 5 - 15  C-reactive protein     Status: Abnormal   Collection Time: 05/10/15  5:03 AM  Result Value Ref Range   CRP 13.9 (H) <1.0 mg/dL    Comment: Performed at [facility]  Glucose, capillary     Status: Abnormal   Collection Time: 05/10/15  7:33 AM  Result Value Ref Range   Glucose-Capillary 258 (H) 65 - 99 mg/dL   Comment 1 Notify RN   Glucose, capillary     Status: Abnormal   Collection Time: 05/10/15 11:18 AM  Result Value Ref Range   Glucose-Capillary 260 (H) 65 - 99 mg/dL    Comment 1 Notify RN   Culture, blood (Routine X 2) w Reflex to ID Panel     Status: None   Collection Time: 05/10/15  3:33 PM  Result Value Ref Range   Specimen Description BLOOD RIGHT AC    Special Requests BOTTLES DRAWN AEROBIC AND ANAEROBIC 4ML    Culture NO GROWTH 5 DAYS    Report Status 05/15/2015 FINAL   Culture, blood (Routine X 2) w Reflex to ID Panel     Status: None   Collection Time: 05/10/15  3:33 PM  Result Value Ref Range   Specimen Description BLOOD LEFT AC    Special Requests      BOTTLES DRAWN AEROBIC AND ANAEROBIC AER 10ML ANA 4ML   Culture NO GROWTH 5 DAYS    Report Status 05/15/2015 FINAL   Glucose, capillary     Status: Abnormal   Collection Time: 05/10/15  4:33 PM  Result Value Ref Range   Glucose-Capillary 320 (H) 65 - 99 mg/dL   Comment 1 Notify RN   Glucose, capillary     Status: Abnormal   Collection Time: 05/10/15  9:52 PM  Result Value Ref Range   Glucose-Capillary 289 (H) 65 - 99 mg/dL   Comment 1 Notify RN   Sedimentation rate     Status: Abnormal   Collection Time: 05/11/15  5:00 AM  Result Value Ref Range   Sed Rate 43 (H) 0 - 20 mm/hr  CBC     Status: Abnormal   Collection Time: 05/11/15  5:00 AM  Result Value Ref Range   WBC 9.2 3.8 - 10.6 K/uL   RBC 4.06 (L) 4.40 - 5.90 MIL/uL   Hemoglobin 11.1 (L) 13.0 - 18.0 g/dL   HCT 33.4 (L) 40.0 - 52.0 %   MCV 82.3 80.0 - 100.0 fL   MCH 27.4 26.0 - 34.0 pg   MCHC  33.3 32.0 - 36.0 g/dL   RDW 15.4 (H) 11.5 - 14.5 %   Platelets 247 150 - 440 K/uL  Basic metabolic panel     Status: Abnormal   Collection Time: 05/11/15  5:00 AM  Result Value Ref Range   Sodium 134 (L) 135 - 145 mmol/L   Potassium 4.5 3.5 - 5.1 mmol/L   Chloride 108 101 - 111 mmol/L   CO2 21 (L) 22 - 32 mmol/L   Glucose, Bld 267 (H) 65 - 99 mg/dL   BUN 38 (H) 6 - 20 mg/dL   Creatinine, Ser 1.39 (H) 0.61 - 1.24 mg/dL   Calcium 8.0 (L) 8.9 - 10.3 mg/dL   GFR calc non Af Amer 50 (L) >60 mL/min   GFR calc Af Amer 58 (L) >60 mL/min     Comment: (NOTE) The eGFR has been calculated using the CKD EPI equation. This calculation has not been validated in all clinical situations. eGFR's persistently <60 mL/min signify possible Chronic Kidney Disease.    Anion gap 5 5 - 15  Glucose, capillary     Status: Abnormal   Collection Time: 05/11/15  7:27 AM  Result Value Ref Range   Glucose-Capillary 187 (H) 65 - 99 mg/dL   Comment 1 Notify RN   Glucose, capillary     Status: Abnormal   Collection Time: 05/11/15 12:12 PM  Result Value Ref Range   Glucose-Capillary 121 (H) 65 - 99 mg/dL   Comment 1 Notify RN   TSH     Status: None   Collection Time: 05/11/15  3:32 PM  Result Value Ref Range   TSH 1.599 0.350 - 4.500 uIU/mL  Troponin I (q 6hr x 3)     Status: Abnormal   Collection Time: 05/11/15  3:32 PM  Result Value Ref Range   Troponin I 0.07 (H) <0.031 ng/mL    Comment: READ BACK AND VERIFIED WITH MATTIE HIMES ON 05/11/15 AT 1658PM BY TLB        PERSISTENTLY INCREASED TROPONIN VALUES IN THE RANGE OF 0.04-0.49 ng/mL CAN BE SEEN IN:       -UNSTABLE ANGINA       -CONGESTIVE HEART FAILURE       -MYOCARDITIS       -CHEST TRAUMA       -ARRYHTHMIAS       -LATE PRESENTING MYOCARDIAL INFARCTION       -COPD   CLINICAL FOLLOW-UP RECOMMENDED.   Glucose, capillary     Status: Abnormal   Collection Time: 05/11/15  4:33 PM  Result Value Ref Range   Glucose-Capillary 161 (H) 65 - 99 mg/dL   Comment 1 Notify RN    Comment 2 Document in Chart   Troponin I (q 6hr x 3)     Status: Abnormal   Collection Time: 05/11/15  9:07 PM  Result Value Ref Range   Troponin I 0.06 (H) <0.031 ng/mL    Comment: PREVIOUS RESULT CALLED  MATT HIMES AT 6767 ON 05/11/15 BY TLB. VAB        PERSISTENTLY INCREASED TROPONIN VALUES IN THE RANGE OF 0.04-0.49 ng/mL CAN BE SEEN IN:       -UNSTABLE ANGINA       -CONGESTIVE HEART FAILURE       -MYOCARDITIS       -CHEST TRAUMA       -ARRYHTHMIAS       -LATE PRESENTING MYOCARDIAL INFARCTION        -COPD   CLINICAL FOLLOW-UP RECOMMENDED.  Glucose, capillary     Status: Abnormal   Collection Time: 05/11/15  9:37 PM  Result Value Ref Range   Glucose-Capillary 221 (H) 65 - 99 mg/dL  Troponin I (q 6hr x 3)     Status: Abnormal   Collection Time: 05/12/15  3:37 AM  Result Value Ref Range   Troponin I 0.05 (H) <0.031 ng/mL    Comment: PREVIOUS RESULT CALLED MATT HIMES AT 6967 ON 05/11/15 BY TLB. VAB        PERSISTENTLY INCREASED TROPONIN VALUES IN THE RANGE OF 0.04-0.49 ng/mL CAN BE SEEN IN:       -UNSTABLE ANGINA       -CONGESTIVE HEART FAILURE       -MYOCARDITIS       -CHEST TRAUMA       -ARRYHTHMIAS       -LATE PRESENTING MYOCARDIAL INFARCTION       -COPD   CLINICAL FOLLOW-UP RECOMMENDED.   Glucose, capillary     Status: Abnormal   Collection Time: 05/12/15  7:29 AM  Result Value Ref Range   Glucose-Capillary 121 (H) 65 - 99 mg/dL   Comment 1 Notify RN   Glucose, capillary     Status: Abnormal   Collection Time: 05/12/15 12:24 PM  Result Value Ref Range   Glucose-Capillary 211 (H) 65 - 99 mg/dL   Comment 1 Notify RN   Glucose, capillary     Status: Abnormal   Collection Time: 05/12/15  5:07 PM  Result Value Ref Range   Glucose-Capillary 278 (H) 65 - 99 mg/dL   Comment 1 Notify RN   Glucose, capillary     Status: Abnormal   Collection Time: 05/12/15  9:20 PM  Result Value Ref Range   Glucose-Capillary 290 (H) 65 - 99 mg/dL  Basic metabolic panel     Status: Abnormal   Collection Time: 05/13/15  7:14 AM  Result Value Ref Range   Sodium 137 135 - 145 mmol/L   Potassium 4.4 3.5 - 5.1 mmol/L   Chloride 106 101 - 111 mmol/L   CO2 24 22 - 32 mmol/L   Glucose, Bld 172 (H) 65 - 99 mg/dL   BUN 29 (H) 6 - 20 mg/dL   Creatinine, Ser 1.24 0.61 - 1.24 mg/dL   Calcium 8.1 (L) 8.9 - 10.3 mg/dL   GFR calc non Af Amer 58 (L) >60 mL/min   GFR calc Af Amer >60 >60 mL/min    Comment: (NOTE) The eGFR has been calculated using the CKD EPI equation. This calculation has not  been validated in all clinical situations. eGFR's persistently <60 mL/min signify possible Chronic Kidney Disease.    Anion gap 7 5 - 15  Glucose, capillary     Status: Abnormal   Collection Time: 05/13/15  7:30 AM  Result Value Ref Range   Glucose-Capillary 154 (H) 65 - 99 mg/dL   Comment 1 Notify RN    Comment 2 Document in Chart   Glucose, capillary     Status: Abnormal   Collection Time: 05/13/15 11:22 AM  Result Value Ref Range   Glucose-Capillary 115 (H) 65 - 99 mg/dL   Comment 1 Notify RN    Comment 2 Document in Chart   CULTURE, BLOOD (ROUTINE X 2) w Reflex to PCR ID Panel     Status: None   Collection Time: 05/13/15  1:08 PM  Result Value Ref Range   Specimen Description BLOOD RIGHT ARM    Special Requests BOTTLES DRAWN AEROBIC  AND ANAEROBIC [place]    Culture NO GROWTH 5 DAYS    Report Status 05/18/2015 FINAL   CULTURE, BLOOD (ROUTINE X 2) w Reflex to PCR ID Panel     Status: None   Collection Time: 05/13/15  1:19 PM  Result Value Ref Range   Specimen Description BLOOD LEFT HAND    Special Requests BOTTLES DRAWN AEROBIC AND ANAEROBIC [place]    Culture NO GROWTH 5 DAYS    Report Status 05/18/2015 FINAL   Glucose, capillary     Status: Abnormal   Collection Time: 05/13/15  4:08 PM  Result Value Ref Range   Glucose-Capillary 156 (H) 65 - 99 mg/dL   Comment 1 Notify RN    Comment 2 Document in Chart   Glucose, capillary     Status: Abnormal   Collection Time: 05/13/15  9:43 PM  Result Value Ref Range   Glucose-Capillary 259 (H) 65 - 99 mg/dL   Comment 1 Notify RN   Glucose, capillary     Status: Abnormal   Collection Time: 05/14/15  7:24 AM  Result Value Ref Range   Glucose-Capillary 233 (H) 65 - 99 mg/dL   Comment 1 Notify RN    Comment 2 Document in Chart   Glucose, capillary     Status: Abnormal   Collection Time: 05/14/15 10:55 AM  Result Value Ref Range   Glucose-Capillary 227 (H) 65 - 99 mg/dL   Comment 1 Notify RN    Comment 2 Document in Chart   Glucose,  capillary     Status: Abnormal   Collection Time: 05/14/15  4:14 PM  Result Value Ref Range   Glucose-Capillary 172 (H) 65 - 99 mg/dL   Comment 1 Notify RN    Comment 2 Document in Chart   Glucose, capillary     Status: Abnormal   Collection Time: 05/14/15  9:14 PM  Result Value Ref Range   Glucose-Capillary 242 (H) 65 - 99 mg/dL  Glucose, capillary     Status: Abnormal   Collection Time: 05/15/15  7:31 AM  Result Value Ref Range   Glucose-Capillary 267 (H) 65 - 99 mg/dL   Comment 1 Notify RN   Glucose, capillary     Status: Abnormal   Collection Time: 05/15/15 11:09 AM  Result Value Ref Range   Glucose-Capillary 103 (H) 65 - 99 mg/dL   Comment 1 Notify RN   Glucose, capillary     Status: Abnormal   Collection Time: 05/15/15  4:35 PM  Result Value Ref Range   Glucose-Capillary 284 (H) 65 - 99 mg/dL   Comment 1 Notify RN   Glucose, capillary     Status: Abnormal   Collection Time: 05/15/15  9:10 PM  Result Value Ref Range   Glucose-Capillary 332 (H) 65 - 99 mg/dL  CBC with Differential/Platelet     Status: Abnormal   Collection Time: 05/16/15  4:29 AM  Result Value Ref Range   WBC 8.4 3.8 - 10.6 K/uL   RBC 3.81 (L) 4.40 - 5.90 MIL/uL   Hemoglobin 10.1 (L) 13.0 - 18.0 g/dL   HCT 30.6 (L) 40.0 - 52.0 %   MCV 80.2 80.0 - 100.0 fL   MCH 26.5 26.0 - 34.0 pg   MCHC 33.1 32.0 - 36.0 g/dL   RDW 15.7 (H) 11.5 - 14.5 %   Platelets 274 150 - 440 K/uL   Neutrophils Relative % 87 %   Neutro Abs 7.3 (H) 1.4 - 6.5 K/uL   Lymphocytes  Relative 8 %   Lymphs Abs 0.6 (L) 1.0 - 3.6 K/uL   Monocytes Relative 5 %   Monocytes Absolute 0.5 0.2 - 1.0 K/uL   Eosinophils Relative 0 %   Eosinophils Absolute 0.0 0 - 0.7 K/uL   Basophils Relative 0 %   Basophils Absolute 0.0 0 - 0.1 K/uL  Creatinine, serum     Status: Abnormal   Collection Time: 05/16/15  4:29 AM  Result Value Ref Range   Creatinine, Ser 1.33 (H) 0.61 - 1.24 mg/dL   GFR calc non Af Amer 53 (L) >60 mL/min   GFR calc Af Amer  >60 >60 mL/min    Comment: (NOTE) The eGFR has been calculated using the CKD EPI equation. This calculation has not been validated in all clinical situations. eGFR's persistently <60 mL/min signify possible Chronic Kidney Disease.   Glucose, capillary     Status: Abnormal   Collection Time: 05/16/15  7:29 AM  Result Value Ref Range   Glucose-Capillary 383 (H) 65 - 99 mg/dL  Glucose, capillary     Status: Abnormal   Collection Time: 05/16/15 11:52 AM  Result Value Ref Range   Glucose-Capillary 222 (H) 65 - 99 mg/dL  Glucose, capillary     Status: Abnormal   Collection Time: 05/16/15  4:30 PM  Result Value Ref Range   Glucose-Capillary 310 (H) 65 - 99 mg/dL  Glucose, capillary     Status: Abnormal   Collection Time: 05/16/15  9:21 PM  Result Value Ref Range   Glucose-Capillary 342 (H) 65 - 99 mg/dL  Glucose, capillary     Status: Abnormal   Collection Time: 05/17/15  7:34 AM  Result Value Ref Range   Glucose-Capillary 156 (H) 65 - 99 mg/dL  Glucose, capillary     Status: Abnormal   Collection Time: 05/17/15 11:43 AM  Result Value Ref Range   Glucose-Capillary 257 (H) 65 - 99 mg/dL      PHQ2/9: Depression screen [facility] 2/9 02/10/2015 01/10/2015  Decreased Interest 0 0  Down, Depressed, Hopeless 0 0  PHQ - 2 Score 0 0    Fall Risk: Fall Risk  05/09/2015 02/10/2015 01/10/2015  Falls in the past year? No No No     Assessment & Plan  1. Hospital discharge follow-up  Doing better, continue follow up with Dr. Williemae Area, we will refer him to Dr. Clayborn Bigness   2. Discitis of lumbar region  Continue antibiotics and follow up with Dr. Ola Spurr  3. Vertebral osteomyelitis ([place])  See above  4. Acute on chronic kidney failure ([place])  Labs improved prior to discharge  5. Bacteremia due to Gram-positive bacteria  Still on antibiotics  6. Severe back pain  He has been taking hydromophrone 2 mg three times daily, explained that I will try changing to Fentanyl patch -  long acting medication to get better control of his symptoms. The equivalent dose would be the 15 mcg but because he is still in so much pain we will try 25 mcg dose. Discussed possible side effects of medication, and importance of following up in 2 weeks  7. Atrial fibrillation, unspecified type [facility])  - Ambulatory referral to Cardiology  8. Type 2 diabetes mellitus with diabetic neuropathy, with long-term current use of insulin (HCC)  Glucose has been high secondary to illness and steroids, advised to adjust dose of Levemir to get fasting between below 140

## 2015-05-30 NOTE — Patient Instructions (Signed)
Please adjust dose of Levemir by 3 units every three days to keep fasting sugar between 120-140

## 2015-06-01 ENCOUNTER — Telehealth: Payer: Self-pay | Admitting: Family Medicine

## 2015-06-01 MED ORDER — HYDROCODONE-ACETAMINOPHEN 10-325 MG PO TABS: 1.0000 | ORAL_TABLET | Freq: Two times a day (BID) | ORAL | Status: DC | PRN

## 2015-06-01 NOTE — Telephone Encounter (Signed)
Pt informed

## 2015-06-01 NOTE — Telephone Encounter (Signed)
Patient states that the pain patch that was prescribed is not working. He is in sufficient amount of pain especially if he has to move. Also, stated that you had mentioned possibly a second MRI and would like to know if you still would like to schedule it. Would also like to know if you could prescribe him something for dry mouth. The hospital had given it to him but is completely out. Stated that it is yellow liquid form but he does not remember the name of it. States when he wake up his tongue is stuck to the roof of his mouth.

## 2015-06-01 NOTE — Telephone Encounter (Signed)
It is too soon to repeat MRI spine, the medication I gave him may take about 12 hours to get in his system. I can give him some hydrocodone to take prn for the next few days until we get the right dose. I don't know what he can use for dry mouth. They have otc mouthwashes without alcohol that may help, but is not a prescription

## 2015-06-06 ENCOUNTER — Telehealth: Payer: Self-pay

## 2015-06-06 NOTE — Telephone Encounter (Signed)
Pt wife called Bethena Roys) and stated that pt(Eric Glover) was feeling worse,pain was not being controlled by medications and that he was shaking. I informed her to take him to the ED so that some blood work can be taken to ensure that the infection has not spread. She stated that home care had taken blood work yesterday but she does not have any results as of yet. She agreed to take [name] to the ED.

## 2015-06-07 ENCOUNTER — Telehealth: Payer: Self-pay | Admitting: Family Medicine

## 2015-06-07 ENCOUNTER — Emergency Department: Payer: Federal, State, Local not specified - PPO

## 2015-06-07 ENCOUNTER — Encounter: Payer: Self-pay | Admitting: Emergency Medicine

## 2015-06-07 ENCOUNTER — Emergency Department
Admission: EM | Admit: 2015-06-07 | Discharge: 2015-06-07 | Disposition: A | Discharge status: Home / Self Care | Payer: Federal, State, Local not specified - PPO | Attending: Emergency Medicine | Admitting: Emergency Medicine

## 2015-06-07 DIAGNOSIS — Z792 Long term (current) use of antibiotics: Secondary | ICD-10-CM | POA: Diagnosis not present

## 2015-06-07 DIAGNOSIS — Z7982 Long term (current) use of aspirin: Secondary | ICD-10-CM | POA: Diagnosis not present

## 2015-06-07 DIAGNOSIS — Z7901 Long term (current) use of anticoagulants: Secondary | ICD-10-CM | POA: Diagnosis not present

## 2015-06-07 DIAGNOSIS — Y9289 Other specified places as the place of occurrence of the external cause: Secondary | ICD-10-CM | POA: Insufficient documentation

## 2015-06-07 DIAGNOSIS — Z7951 Long term (current) use of inhaled steroids: Secondary | ICD-10-CM | POA: Insufficient documentation

## 2015-06-07 DIAGNOSIS — I129 Hypertensive chronic kidney disease with stage 1 through stage 4 chronic kidney disease, or unspecified chronic kidney disease: Secondary | ICD-10-CM | POA: Insufficient documentation

## 2015-06-07 DIAGNOSIS — Z794 Long term (current) use of insulin: Secondary | ICD-10-CM | POA: Insufficient documentation

## 2015-06-07 DIAGNOSIS — W06XXXA Fall from bed, initial encounter: Secondary | ICD-10-CM | POA: Diagnosis not present

## 2015-06-07 DIAGNOSIS — Y998 Other external cause status: Secondary | ICD-10-CM | POA: Diagnosis not present

## 2015-06-07 DIAGNOSIS — S3992XA Unspecified injury of lower back, initial encounter: Secondary | ICD-10-CM | POA: Diagnosis present

## 2015-06-07 DIAGNOSIS — E119 Type 2 diabetes mellitus without complications: Secondary | ICD-10-CM | POA: Insufficient documentation

## 2015-06-07 DIAGNOSIS — N183 Chronic kidney disease, stage 3 (moderate): Secondary | ICD-10-CM | POA: Insufficient documentation

## 2015-06-07 DIAGNOSIS — M5441 Lumbago with sciatica, right side: Secondary | ICD-10-CM | POA: Diagnosis not present

## 2015-06-07 DIAGNOSIS — Y9389 Activity, other specified: Secondary | ICD-10-CM | POA: Insufficient documentation

## 2015-06-07 DIAGNOSIS — Z79899 Other long term (current) drug therapy: Secondary | ICD-10-CM | POA: Insufficient documentation

## 2015-06-07 HISTORY — DX: Unspecified staphylococcus as the cause of diseases classified elsewhere: B95.8

## 2015-06-07 LAB — BASIC METABOLIC PANEL
ANION GAP: 8 (ref 5–15)
BUN: 23 mg/dL — ABNORMAL HIGH (ref 6–20)
CALCIUM: 8.6 mg/dL — AB (ref 8.9–10.3)
CO2: 23 mmol/L (ref 22–32)
Chloride: 102 mmol/L (ref 101–111)
Creatinine, Ser: 1.32 mg/dL — ABNORMAL HIGH (ref 0.61–1.24)
GFR, EST NON AFRICAN AMERICAN: 53 mL/min — AB (ref >60)
GLUCOSE: 280 mg/dL — AB (ref 65–99)
POTASSIUM: 4.9 mmol/L (ref 3.5–5.1)
SODIUM: 133 mmol/L — AB (ref 135–145)

## 2015-06-07 LAB — GLUCOSE, CAPILLARY: GLUCOSE-CAPILLARY: 230 mg/dL — AB (ref 65–99)

## 2015-06-07 LAB — CBC WITH DIFFERENTIAL/PLATELET
BASOS ABS: 0.1 10*3/uL (ref 0–0.1)
Basophils Relative: 1 %
EOS ABS: 0.2 10*3/uL (ref 0–0.7)
Eosinophils Relative: 2 %
HEMATOCRIT: 33.7 % — AB (ref 40.0–52.0)
HEMOGLOBIN: 11 g/dL — AB (ref 13.0–18.0)
LYMPHS PCT: 13 %
Lymphs Abs: 1.1 10*3/uL (ref 1.0–3.6)
MCH: 26.1 pg (ref 26.0–34.0)
MCHC: 32.7 g/dL (ref 32.0–36.0)
MCV: 79.9 fL — ABNORMAL LOW (ref 80.0–100.0)
MONOS PCT: 8 %
Monocytes Absolute: 0.6 10*3/uL (ref 0.2–1.0)
NEUTROS ABS: 6.1 10*3/uL (ref 1.4–6.5)
NEUTROS PCT: 76 %
Platelets: 317 10*3/uL (ref 150–440)
RBC: 4.21 MIL/uL — AB (ref 4.40–5.90)
RDW: 15.4 % — ABNORMAL HIGH (ref 11.5–14.5)
WBC: 8.1 10*3/uL (ref 3.8–10.6)

## 2015-06-07 MED ORDER — ONDANSETRON HCL 4 MG/2ML IJ SOLN
4.0000 mg | Freq: Once | INTRAMUSCULAR | Status: AC
  Administered 2015-06-07: 4 mg via INTRAVENOUS
  Filled 2015-06-07: qty 2

## 2015-06-07 MED ORDER — FENTANYL 50 MCG/HR TD PT72
50.0000 ug | MEDICATED_PATCH | TRANSDERMAL | Status: DC
Start: 2015-06-07 — End: 2015-06-13

## 2015-06-07 MED ORDER — OXYCODONE-ACETAMINOPHEN 5-325 MG PO TABS: 1.0000 | ORAL_TABLET | ORAL | Status: DC | PRN

## 2015-06-07 MED ORDER — LORAZEPAM 2 MG/ML IJ SOLN
1.0000 mg | Freq: Once | INTRAMUSCULAR | Status: AC
  Administered 2015-06-07: 1 mg via INTRAVENOUS
  Filled 2015-06-07: qty 1

## 2015-06-07 MED ORDER — HYDROMORPHONE HCL 1 MG/ML IJ SOLN
1.0000 mg | Freq: Once | INTRAMUSCULAR | Status: AC
  Administered 2015-06-07: 1 mg via INTRAVENOUS
  Filled 2015-06-07: qty 1

## 2015-06-07 NOTE — ED Notes (Signed)
Pt transported home via [facility]

## 2015-06-07 NOTE — ED Notes (Signed)
Patient transported to X-ray 

## 2015-06-07 NOTE — ED Provider Notes (Signed)
Time Seen: Approximately 1120  I have reviewed the triage notes  Chief Complaint: Back Pain and Fall   History of Present Illness: Eric Glover is a 70 y.o. 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 Patient is on home antibiotics through a functioning PIC line and will be on antibiotics for an extensive period of time. Patient's complaint today as he was attempting to get out of bed and fell to the floor and landed primarily on his knees and hands. He states he's aggravated his back pain he has presentation with low back pain radiating toward the right knee without obvious leg weakness. Patient fell approximately 1 AM and was able get up and get back into bed and then was transported here by EMS later. His wife arrives later and states that he's overall had adequate pain control at home. She has not noticed any new fever.   Past Medical History  Diagnosis Date  . Diabetes mellitus without complication ([place])   . GERD (gastroesophageal reflux disease)   . Hyperlipidemia   . Hypertension   . CKD (chronic kidney disease), stage III   . Gout   . Staph infection 2017    Patient Active Problem List   Diagnosis Date Noted  . A-fib ([place]) 05/30/2015  . Bacteremia due to Gram-positive bacteria 05/24/2015  . DDD (degenerative disc disease), lumbar 05/09/2015  . Sciatica associated with disorder of lumbar spine 05/09/2015  . Fever 05/08/2015  . HTN (hypertension) 05/06/2015  . Acute on chronic kidney failure ([place]) 05/06/2015  . Low back pain 05/06/2015  . Diabetes mellitus with neuropathy ([place]) 01/11/2015  . Benign hypertensive kidney disease 05/31/2010  . Chronic kidney disease (CKD), stage III (moderate) 05/31/2010  . Gout 05/31/2010  . HLD (hyperlipidemia) 05/31/2010  . Type 2 diabetes mellitus ([place]) 05/31/2010    Past Surgical History  Procedure Laterality Date  . Shoulder  arthroscopy w/ rotator cuff repair    . Tee without cardioversion N/A 05/15/2015    Procedure: TRANSESOPHAGEAL ECHOCARDIOGRAM (TEE);  Surgeon: Teodoro Spray, MD;  Location: [facility] ORS;  Service: Cardiovascular;  Laterality: N/A;    Past Surgical History  Procedure Laterality Date  . Shoulder arthroscopy w/ rotator cuff repair    . Tee without cardioversion N/A 05/15/2015    Procedure: TRANSESOPHAGEAL ECHOCARDIOGRAM (TEE);  Surgeon: Teodoro Spray, MD;  Location: [facility] ORS;  Service: Cardiovascular;  Laterality: N/A;    Current Outpatient Rx  Name  Route  Sig  Dispense  Refill  . allopurinol (ZYLOPRIM) 100 MG tablet   Oral   Take 100 mg by mouth.         Marland Kitchen amiodarone (PACERONE) 200 MG tablet      2 tabs daily for four days then one tab daily afterwards   34 tablet   0   . apixaban (ELIQUIS) 5 MG TABS tablet      2 tabs oral  twice a day for four more days then 1 tab twice a day   60 tablet   0   . aspirin EC 81 MG tablet   Oral   Take 81 mg by mouth.         . beclomethasone (QVAR) 80 MCG/ACT inhaler   Inhalation   Inhale 2 puffs into the lungs 2 (two) times daily. Patient taking differently: Inhale 2 puffs into the lungs 2 (two) times daily as needed (  shortness of breath).    1 Inhaler   12   . cefTRIAXone (ROCEPHIN) 2-2.22 GM-% IVPB   Intravenous   Inject into the vein.         . cefTRIAXone 2 g in dextrose 5 % 50 mL   Intravenous   Inject 2 g into the vein daily.   7 Dose   0     At 3pm   . colchicine (COLCRYS) 0.6 MG tablet   Oral   Take 0.6 mg by mouth daily as needed (gout flair).          Marland Kitchen doxazosin (CARDURA) 8 MG tablet   Oral   Take 8 mg by mouth at bedtime.          . fentaNYL (DURAGESIC) 25 MCG/HR patch   Transdermal   Place 1 patch (25 mcg total) onto the skin every 3 (three) days.   10 patch   0   . fluticasone (FLONASE) 50 MCG/ACT nasal spray   Each Nare   Place 2 sprays into both nostrils daily. Patient taking differently:  Place 2 sprays into both nostrils daily as needed for allergies or rhinitis.    16 g   6   . furosemide (LASIX) 20 MG tablet   Oral   Take 20 mg by mouth daily.          Marland Kitchen HYDROcodone-acetaminophen (NORCO) 10-325 MG tablet   Oral   Take 1 tablet by mouth 2 (two) times daily as needed.   20 tablet   0   . insulin detemir (LEVEMIR) 100 UNIT/ML injection   Subcutaneous   Inject 0.25 mLs (25 Units total) into the skin at bedtime.   10 mL   11   . lansoprazole (PREVACID) 15 MG capsule   Oral   Take 15 mg by mouth daily.          Marland Kitchen lidocaine (LIDODERM) 5 %   Transdermal   Place 1 patch onto the skin daily. Remove & Discard patch within 12 hours or as directed by MD   30 patch   0   . metoprolol tartrate (LOPRESSOR) 25 MG tablet   Oral   Take 1 tablet (25 mg total) by mouth 2 (two) times daily.   60 tablet   0   . nystatin (MYCOSTATIN) 100000 UNIT/ML suspension   Oral   Take 5 mLs (500,000 Units total) by mouth 4 (four) times daily.   60 mL   0   . polyethylene glycol (MIRALAX / GLYCOLAX) packet   Oral   Take 17 g by mouth daily as needed (constipation).   30 each   0   . simvastatin (ZOCOR) 80 MG tablet   Oral   Take 80 mg by mouth daily at 6 PM.          . vitamin B-12 (CYANOCOBALAMIN) 500 MCG tablet   Oral   Take 500 mcg by mouth daily.            Allergies:  Neomycin-bacitracin zn-polymyx  Family History: Family History  Problem Relation Age of Onset  . Alcohol abuse Paternal Uncle   . Early death Paternal Uncle   . Arthritis Mother   . Cancer Mother   . Diabetes Mother   . Hearing loss Mother   . Heart disease Mother   . Hyperlipidemia Mother   . Hypertension Mother   . Kidney disease Mother   . Diabetes Father   . Hearing loss Father  Social History: Social History  Substance Use Topics  . Smoking status: Never Smoker   . Smokeless tobacco: None  . Alcohol Use: No     Review of Systems:   10 point review of systems was  performed and was otherwise negative:  Constitutional: No fever Eyes: No visual disturbances ENT: No sore throat, ear pain Cardiac: No chest pain Respiratory: No shortness of breath, wheezing, or stridor Abdomen: No abdominal pain, no vomiting, No diarrhea Endocrine: No weight loss, No night sweats Extremities: No peripheral edema, cyanosis Skin: No rashes, easy bruising Neurologic: No focal weakness, trouble with speech or swollowing. He denies any syncope Urologic: No dysuria, Hematuria, or urinary frequency Patient denies any cervical or thoracic pain or trauma.  Physical Exam:  ED Triage Vitals  Enc Vitals Group     BP 06/07/15 1115 153/79 mmHg     Pulse Rate 06/07/15 1115 62     Resp 06/07/15 1115 22     Temp 06/07/15 1118 97.8 F (36.6 C)     Temp Source 06/07/15 1118 Oral     SpO2 06/07/15 1115 99 %     Weight 06/07/15 1118 203 lb (92.08 kg)     Height 06/07/15 1118 6\' 2"  (1.88 m)     Head Cir --      Peak Flow --      Pain Score 06/07/15 1118 9     Pain Loc --      Pain Edu? --      Excl. in [place]? --     General: Awake , Alert , and Oriented times 3; GCS 15 Head: Normal cephalic , atraumatic Eyes: Pupils equal , round, reactive to light Nose/Throat: No nasal drainage, patent upper airway without erythema or exudate.  Neck: Supple, Full range of motion, No anterior adenopathy or palpable thyroid masses Lungs: Clear to ascultation without wheezes , rhonchi, or rales Heart: Regular rate, regular rhythm without murmurs , gallops , or rubs Abdomen: Soft, non tender without rebound, guarding , or rigidity; bowel sounds positive and symmetric in all 4 quadrants. No organomegaly .        Extremities: 2 plus symmetric pulses. No edema, clubbing or cyanosis Neurologic: normal ambulation, Motor symmetric without deficits, sensory intact Skin: warm, dry, no rashes   Labs:   All laboratory work was reviewed including any pertinent negatives or positives listed below:   Labs Reviewed  BASIC METABOLIC PANEL - Abnormal; Notable for the following:    Sodium 133 (*)    Glucose, Bld 280 (*)    BUN 23 (*)    Creatinine, Ser 1.32 (*)    Calcium 8.6 (*)    GFR calc non Af Amer 53 (*)    All other components within normal limits  CBC WITH DIFFERENTIAL/PLATELET - Abnormal; Notable for the following:    RBC 4.21 (*)    Hemoglobin 11.0 (*)    HCT 33.7 (*)    MCV 79.9 (*)    RDW 15.4 (*)    All other components within normal limits  GLUCOSE, CAPILLARY - Abnormal; Notable for the following:    Glucose-Capillary 230 (*)    All other components within normal limits  Laboratory work was reviewed and showed no clinically significant abnormalities.   EKG: * ED ECG REPORT I, Daymon Larsen, the attending physician, personally viewed and interpreted this ECG.  Date: 06/07/2015 EKG Time: 1116 Rate: 62 Rhythm: normal sinus rhythm QRS Axis: normal Intervals: normal ST/T Wave abnormalities: normal Conduction  Disturbances: none Narrative Interpretation: unremarkable No obvious acute ischemic changes   Radiology:   EXAM: LUMBAR SPINE - 2-3 VIEW  COMPARISON: Lumbar MRI 05/24/2015  FINDINGS: There are 5 nonrib bearing lumbar-type vertebral bodies.  The vertebral body heights are maintained.  There is grade 1 anterolisthesis of L5 on S1. There is 3 mm of retrolisthesis of L3 on L4.  There is no acute fracture.  There is degenerative disc disease and facet arthropathy at L3-4, L4-5 and L5-S1.  The SI joints are unremarkable.  There is abdominal aortic atherosclerosis.  IMPRESSION: No acute osseous injury of the lumbar spine.    I personally reviewed the radiologic studies     ED Course: Patient has a history of infectious discitis and is currently on home IV antibiotic therapy. The patient's pain seems to be indicative of lumbar back pain with sciatica she's had also before in the past. I do not suspect the patient has cauda equina or  an epidural abscess at this time. Repeat ED MRI was not necessary at this time. Patient's presentation seemed to come down to pain control and he received 2 doses of IV Dilaudid with some symptomatic improvement.  We felt we could discharge the patient home by BLS transport due to his back pain and increase his medications at home for back pain. He did not have any direct trauma to the back and again his lumbar spine films show no obvious bony erosion. He is currently afebrile with a normal white blood cell count, etc.   Assessment: * Acute low back pain with right-sided sciatica History of discitis    Plan:  Outpatient management Patient was advised to return immediately if condition worsens. Patient was advised to follow up with their primary care physician or other specialized physicians involved in their outpatient care. The patient and/or family member/power of attorney had laboratory results reviewed at the bedside. All questions and concerns were addressed and appropriate discharge instructions were distributed by the nursing staff.             Daymon Larsen, MD 06/07/15 1520

## 2015-06-07 NOTE — Telephone Encounter (Signed)
PT WENT TO ER LIKE [facility]OWLES SUGGESTED AND THEY WANT TO KNOW IF THEY CAN GET SOME MORE HYDROCODONE FOR HIS PAIN. SAID THAT INFECTION WAS STILL IN HIS BACK BUT HAD NOT SPREAD LIKE THEY HAD ORIGINALLY THOUGHT.

## 2015-06-07 NOTE — ED Notes (Signed)
Pt in via EMS; pt report having a bulging disc with chronic back pain x 4 years.  Pt reports that back pain has been worse over the last 5 weeks; pt reports 2 falls since then, with the last being last night while in bathroom.  Pt denies LOC, denies hitting head at time of fall.  Pt was in bed upon EMS arrival and stated he could not get out of bed due to back pain being so severe.  Pt A/Ox4, vitals WDL, no immediate distress at this time.  Pt presents with picc line in right arm; states he is currently being treated for a staph infection.

## 2015-06-07 NOTE — Discharge Instructions (Signed)
Sciatica °Sciatica is pain, weakness, numbness, or tingling along the path of the sciatic nerve. The nerve starts in the lower back and runs down the back of each leg. The nerve controls the muscles in the lower leg and in the back of the knee, while also providing sensation to the back of the thigh, lower leg, and the sole of your foot. Sciatica is a symptom of another medical condition. For instance, nerve damage or certain conditions, such as a herniated disk or bone spur on the spine, pinch or put pressure on the sciatic nerve. This causes the pain, weakness, or other sensations normally associated with sciatica. Generally, sciatica only affects one side of the body. °CAUSES  °· Herniated or slipped disc. °· Degenerative disk disease. °· A pain disorder involving the narrow muscle in the buttocks (piriformis syndrome). °· Pelvic injury or fracture. °· Pregnancy. °· Tumor (rare). °SYMPTOMS  °Symptoms can vary from mild to very severe. The symptoms usually travel from the low back to the buttocks and down the back of the leg. Symptoms can include: °· Mild tingling or dull aches in the lower back, leg, or hip. °· Numbness in the back of the calf or sole of the foot. °· Burning sensations in the lower back, leg, or hip. °· Sharp pains in the lower back, leg, or hip. °· Leg weakness. °· Severe back pain inhibiting movement. °These symptoms may get worse with coughing, sneezing, laughing, or prolonged sitting or standing. Also, being overweight may worsen symptoms. °DIAGNOSIS  °Your caregiver will perform a physical exam to look for common symptoms of sciatica. He or she may ask you to do certain movements or activities that would trigger sciatic nerve pain. Other tests may be performed to find the cause of the sciatica. These may include: °· Blood tests. °· X-rays. °· Imaging tests, such as an MRI or CT scan. °TREATMENT  °Treatment is directed at the cause of the sciatic pain. Sometimes, treatment is not necessary  and the pain and discomfort goes away on its own. If treatment is needed, your caregiver may suggest: °· Over-the-counter medicines to relieve pain. °· Prescription medicines, such as anti-inflammatory medicine, muscle relaxants, or narcotics. °· Applying heat or ice to the painful area. °· Steroid injections to lessen pain, irritation, and inflammation around the nerve. °· Reducing activity during periods of pain. °· Exercising and stretching to strengthen your abdomen and improve flexibility of your spine. Your caregiver may suggest losing weight if the extra weight makes the back pain worse. °· Physical therapy. °· Surgery to eliminate what is pressing or pinching the nerve, such as a bone spur or part of a herniated disk. °HOME CARE INSTRUCTIONS  °· Only take over-the-counter or prescription medicines for pain or discomfort as directed by your caregiver. °· Apply ice to the affected area for 20 minutes, 3-4 times a day for the first 48-72 hours. Then try heat in the same way. °· Exercise, stretch, or perform your usual activities if these do not aggravate your pain. °· Attend physical therapy sessions as directed by your caregiver. °· Keep all follow-up appointments as directed by your caregiver. °· Do not wear high heels or shoes that do not provide proper support. °· Check your mattress to see if it is too soft. A firm mattress may lessen your pain and discomfort. °SEEK IMMEDIATE MEDICAL CARE IF:  °· You lose control of your bowel or bladder (incontinence). °· You have increasing weakness in the lower back, pelvis, buttocks,   or legs.  You have redness or swelling of your back.  You have a burning sensation when you urinate.  You have pain that gets worse when you lie down or awakens you at night.  Your pain is worse than you have experienced in the past.  Your pain is lasting longer than 4 weeks.  You are suddenly losing weight without reason. MAKE SURE YOU:  Understand these  instructions.  Will watch your condition.  Will get help right away if you are not doing well or get worse.   This information is not intended to replace advice given to you by your health care provider. Make sure you discuss any questions you have with your health care provider.   Document Released: 03/05/2001 Document Revised: 11/30/2014 Document Reviewed: 07/21/2011 Elsevier Interactive Patient Education [employer].  Please return immediately if condition worsens. Please contact her primary physician or the physician you were given for referral. If you have any specialist physicians involved in her treatment and plan please also contact them. Thank you for using [place] [facility] emergency Department. Return to emergency department especially for increased weakness, fever, difficulty with bladder or bowel function, or any other new concerns

## 2015-06-07 NOTE — Care Management Note (Signed)
Case Management Note  Patient Details  Name: Eric Glover MRN: [id number] Date of Birth: 1945-05-20  Subjective/Objective:      Informed by registration that patient has VA benefits. No decision about dispo. Yet.              Action/Plan:   Expected Discharge Date:                  Expected Discharge Plan:     In-House Referral:     Discharge planning Services     Post Acute Care Choice:    Choice offered to:     DME Arranged:    DME Agency:     HH Arranged:    [place] Agency:     Status of Service:     Medicare Important Message Given:    Date Medicare IM Given:    Medicare IM give by:    Date Additional Medicare IM Given:    Additional Medicare Important Message give by:     If discussed at [place] of Stay Meetings, dates discussed:    Additional Comments:  Beau Fanny, RN 06/07/2015, 12:37 PM

## 2015-06-08 ENCOUNTER — Telehealth: Payer: Self-pay | Admitting: Family Medicine

## 2015-06-08 NOTE — Telephone Encounter (Signed)
Please Advise

## 2015-06-08 NOTE — Telephone Encounter (Signed)
Please call wife about [name]. Eric Glover that she needs to let us know that he went to the hosp on 06-07-15 and that they have had to change his meds

## 2015-06-08 NOTE — Telephone Encounter (Signed)
He needs to try two patches, before we give him more hydrocodone

## 2015-06-09 NOTE — Telephone Encounter (Signed)
Patient was informed of Dr. Ancil Boozer' message and agreed to her plan of action.

## 2015-06-09 NOTE — Telephone Encounter (Signed)
Spoke with pt

## 2015-06-13 ENCOUNTER — Ambulatory Visit (INDEPENDENT_AMBULATORY_CARE_PROVIDER_SITE_OTHER): Payer: Federal, State, Local not specified - PPO | Admitting: Family Medicine

## 2015-06-13 ENCOUNTER — Emergency Department: Payer: Medicare Other

## 2015-06-13 ENCOUNTER — Encounter: Payer: Self-pay | Admitting: Family Medicine

## 2015-06-13 ENCOUNTER — Inpatient Hospital Stay
Admission: EM | Admit: 2015-06-13 | Discharge: 2015-06-16 | DRG: 682 | Disposition: A | Discharge status: Home / Self Care | Payer: Medicare Other | Attending: Internal Medicine | Admitting: Internal Medicine

## 2015-06-13 VITALS — BP 87/48 | HR 72 | Temp 98.0°F | Resp 12 | Ht 74.0 in | Wt 201.0 lb

## 2015-06-13 DIAGNOSIS — Z809 Family history of malignant neoplasm, unspecified: Secondary | ICD-10-CM

## 2015-06-13 DIAGNOSIS — K219 Gastro-esophageal reflux disease without esophagitis: Secondary | ICD-10-CM | POA: Diagnosis present

## 2015-06-13 DIAGNOSIS — Z86711 Personal history of pulmonary embolism: Secondary | ICD-10-CM

## 2015-06-13 DIAGNOSIS — N183 Chronic kidney disease, stage 3 (moderate): Secondary | ICD-10-CM | POA: Diagnosis present

## 2015-06-13 DIAGNOSIS — K7200 Acute and subacute hepatic failure without coma: Secondary | ICD-10-CM | POA: Diagnosis present

## 2015-06-13 DIAGNOSIS — Z794 Long term (current) use of insulin: Secondary | ICD-10-CM

## 2015-06-13 DIAGNOSIS — M4646 Discitis, unspecified, lumbar region: Secondary | ICD-10-CM

## 2015-06-13 DIAGNOSIS — K8020 Calculus of gallbladder without cholecystitis without obstruction: Secondary | ICD-10-CM | POA: Diagnosis present

## 2015-06-13 DIAGNOSIS — N179 Acute kidney failure, unspecified: Secondary | ICD-10-CM | POA: Diagnosis present

## 2015-06-13 DIAGNOSIS — A499 Bacterial infection, unspecified: Secondary | ICD-10-CM

## 2015-06-13 DIAGNOSIS — L8990 Pressure ulcer of unspecified site, unspecified stage: Secondary | ICD-10-CM | POA: Insufficient documentation

## 2015-06-13 DIAGNOSIS — M4640 Discitis, unspecified, site unspecified: Secondary | ICD-10-CM | POA: Diagnosis present

## 2015-06-13 DIAGNOSIS — E1122 Type 2 diabetes mellitus with diabetic chronic kidney disease: Secondary | ICD-10-CM | POA: Diagnosis present

## 2015-06-13 DIAGNOSIS — Z833 Family history of diabetes mellitus: Secondary | ICD-10-CM

## 2015-06-13 DIAGNOSIS — I4891 Unspecified atrial fibrillation: Secondary | ICD-10-CM | POA: Diagnosis present

## 2015-06-13 DIAGNOSIS — I951 Orthostatic hypotension: Secondary | ICD-10-CM | POA: Diagnosis not present

## 2015-06-13 DIAGNOSIS — E860 Dehydration: Secondary | ICD-10-CM | POA: Diagnosis present

## 2015-06-13 DIAGNOSIS — Z9889 Other specified postprocedural states: Secondary | ICD-10-CM

## 2015-06-13 DIAGNOSIS — Z841 Family history of disorders of kidney and ureter: Secondary | ICD-10-CM | POA: Diagnosis not present

## 2015-06-13 DIAGNOSIS — Z86718 Personal history of other venous thrombosis and embolism: Secondary | ICD-10-CM

## 2015-06-13 DIAGNOSIS — Z79899 Other long term (current) drug therapy: Secondary | ICD-10-CM | POA: Diagnosis not present

## 2015-06-13 DIAGNOSIS — Z7982 Long term (current) use of aspirin: Secondary | ICD-10-CM | POA: Diagnosis not present

## 2015-06-13 DIAGNOSIS — R7989 Other specified abnormal findings of blood chemistry: Secondary | ICD-10-CM | POA: Diagnosis present

## 2015-06-13 DIAGNOSIS — B955 Unspecified streptococcus as the cause of diseases classified elsewhere: Secondary | ICD-10-CM | POA: Diagnosis present

## 2015-06-13 DIAGNOSIS — Z8261 Family history of arthritis: Secondary | ICD-10-CM | POA: Diagnosis not present

## 2015-06-13 DIAGNOSIS — Z8249 Family history of ischemic heart disease and other diseases of the circulatory system: Secondary | ICD-10-CM

## 2015-06-13 DIAGNOSIS — M109 Gout, unspecified: Secondary | ICD-10-CM | POA: Diagnosis present

## 2015-06-13 DIAGNOSIS — Z888 Allergy status to other drugs, medicaments and biological substances status: Secondary | ICD-10-CM

## 2015-06-13 DIAGNOSIS — I959 Hypotension, unspecified: Secondary | ICD-10-CM | POA: Diagnosis present

## 2015-06-13 DIAGNOSIS — I129 Hypertensive chronic kidney disease with stage 1 through stage 4 chronic kidney disease, or unspecified chronic kidney disease: Secondary | ICD-10-CM | POA: Diagnosis present

## 2015-06-13 DIAGNOSIS — E1140 Type 2 diabetes mellitus with diabetic neuropathy, unspecified: Secondary | ICD-10-CM

## 2015-06-13 DIAGNOSIS — A419 Sepsis, unspecified organism: Secondary | ICD-10-CM

## 2015-06-13 DIAGNOSIS — R945 Abnormal results of liver function studies: Secondary | ICD-10-CM

## 2015-06-13 DIAGNOSIS — R7881 Bacteremia: Secondary | ICD-10-CM

## 2015-06-13 HISTORY — DX: Unspecified atrial fibrillation: I48.91

## 2015-06-13 HISTORY — DX: Discitis, unspecified, lumbosacral region: M46.47

## 2015-06-13 HISTORY — DX: Other pulmonary embolism without acute cor pulmonale: I26.99

## 2015-06-13 LAB — CBC WITH DIFFERENTIAL/PLATELET
Basophils Absolute: 0 10*3/uL (ref 0–0.1)
Basophils Relative: 0 %
EOS ABS: 0.1 10*3/uL (ref 0–0.7)
Eosinophils Relative: 2 %
HCT: 34.3 % — ABNORMAL LOW (ref 40.0–52.0)
HEMOGLOBIN: 11.4 g/dL — AB (ref 13.0–18.0)
LYMPHS ABS: 0.8 10*3/uL — AB (ref 1.0–3.6)
Lymphocytes Relative: 10 %
MCH: 26.5 pg (ref 26.0–34.0)
MCHC: 33.2 g/dL (ref 32.0–36.0)
MCV: 80 fL (ref 80.0–100.0)
MONOS PCT: 5 %
Monocytes Absolute: 0.4 10*3/uL (ref 0.2–1.0)
NEUTROS PCT: 83 %
Neutro Abs: 7.3 10*3/uL — ABNORMAL HIGH (ref 1.4–6.5)
Platelets: 321 10*3/uL (ref 150–440)
RBC: 4.29 MIL/uL — ABNORMAL LOW (ref 4.40–5.90)
RDW: 15.9 % — ABNORMAL HIGH (ref 11.5–14.5)
WBC: 8.8 10*3/uL (ref 3.8–10.6)

## 2015-06-13 LAB — TROPONIN I: Troponin I: <0.03 ng/mL (ref <0.031)

## 2015-06-13 LAB — COMPREHENSIVE METABOLIC PANEL
ALBUMIN: 3.1 g/dL — AB (ref 3.5–5.0)
ALK PHOS: 371 U/L — AB (ref 38–126)
ALT: 493 U/L — AB (ref 17–63)
AST: 284 U/L — AB (ref 15–41)
Anion gap: 13 (ref 5–15)
BILIRUBIN TOTAL: 1.6 mg/dL — AB (ref 0.3–1.2)
BUN: 28 mg/dL — AB (ref 6–20)
CALCIUM: 8.7 mg/dL — AB (ref 8.9–10.3)
CO2: 22 mmol/L (ref 22–32)
CREATININE: 2.26 mg/dL — AB (ref 0.61–1.24)
Chloride: 99 mmol/L — ABNORMAL LOW (ref 101–111)
GFR calc Af Amer: 32 mL/min — ABNORMAL LOW (ref >60)
GFR calc non Af Amer: 28 mL/min — ABNORMAL LOW (ref >60)
GLUCOSE: 268 mg/dL — AB (ref 65–99)
Potassium: 4.8 mmol/L (ref 3.5–5.1)
Sodium: 134 mmol/L — ABNORMAL LOW (ref 135–145)
TOTAL PROTEIN: 7.2 g/dL (ref 6.5–8.1)

## 2015-06-13 LAB — PROTIME-INR
INR: 1.57
Prothrombin Time: 18.8 seconds — ABNORMAL HIGH (ref 11.4–15.0)

## 2015-06-13 LAB — LACTIC ACID, PLASMA
Lactic Acid, Venous: 2.8 mmol/L (ref 0.5–2.0)
Lactic Acid, Venous: 1 mmol/L (ref 0.5–2.0)

## 2015-06-13 LAB — APTT: APTT: 40 s — AB (ref 24–36)

## 2015-06-13 LAB — GLUCOSE, CAPILLARY: GLUCOSE-CAPILLARY: 155 mg/dL — AB (ref 65–99)

## 2015-06-13 LAB — LIPASE, BLOOD: Lipase: 15 U/L (ref 11–51)

## 2015-06-13 MED ORDER — BECLOMETHASONE DIPROPIONATE 80 MCG/ACT IN AERS: 2.0000 | INHALATION_SPRAY | Freq: Two times a day (BID) | RESPIRATORY_TRACT | Status: DC | PRN

## 2015-06-13 MED ORDER — POLYETHYLENE GLYCOL 3350 17 G PO PACK: 17.0000 g | PACK | Freq: Every day | ORAL | Status: DC | PRN

## 2015-06-13 MED ORDER — ASPIRIN EC 81 MG PO TBEC
81.0000 mg | DELAYED_RELEASE_TABLET | Freq: Every day | ORAL | Status: DC
  Administered 2015-06-13 – 2015-06-15 (×3): 81 mg via ORAL
  Filled 2015-06-13 (×3): qty 1

## 2015-06-13 MED ORDER — FENTANYL 50 MCG/HR TD PT72
50.0000 ug | MEDICATED_PATCH | TRANSDERMAL | Status: DC
  Administered 2015-06-14: 50 ug via TRANSDERMAL
  Filled 2015-06-13 (×2): qty 1

## 2015-06-13 MED ORDER — SODIUM CHLORIDE 0.9 % IV SOLN
INTRAVENOUS | Status: DC
  Administered 2015-06-13 – 2015-06-14 (×3): via INTRAVENOUS

## 2015-06-13 MED ORDER — INSULIN ASPART 100 UNIT/ML ~~LOC~~ SOLN
0.0000 [IU] | Freq: Three times a day (TID) | SUBCUTANEOUS | Status: DC
  Administered 2015-06-14: 3 [IU] via SUBCUTANEOUS
  Administered 2015-06-14: 2 [IU] via SUBCUTANEOUS
  Administered 2015-06-14: 3 [IU] via SUBCUTANEOUS
  Administered 2015-06-16: 2 [IU] via SUBCUTANEOUS
  Filled 2015-06-13: qty 3
  Filled 2015-06-13: qty 2
  Filled 2015-06-13: qty 3
  Filled 2015-06-13: qty 2

## 2015-06-13 MED ORDER — INSULIN ASPART 100 UNIT/ML ~~LOC~~ SOLN: 4.0000 [IU] | Freq: Three times a day (TID) | SUBCUTANEOUS | Status: DC

## 2015-06-13 MED ORDER — ALLOPURINOL 100 MG PO TABS
100.0000 mg | ORAL_TABLET | Freq: Every day | ORAL | Status: DC
  Administered 2015-06-14 – 2015-06-16 (×3): 100 mg via ORAL
  Filled 2015-06-13 (×3): qty 1

## 2015-06-13 MED ORDER — VANCOMYCIN HCL 10 G IV SOLR
2000.0000 mg | Freq: Once | INTRAVENOUS | Status: AC
  Administered 2015-06-13: 2000 mg via INTRAVENOUS
  Filled 2015-06-13: qty 2000

## 2015-06-13 MED ORDER — APIXABAN 5 MG PO TABS
5.0000 mg | ORAL_TABLET | Freq: Two times a day (BID) | ORAL | Status: DC
  Administered 2015-06-13 – 2015-06-16 (×6): 5 mg via ORAL
  Filled 2015-06-13 (×6): qty 1

## 2015-06-13 MED ORDER — OXYCODONE-ACETAMINOPHEN 5-325 MG PO TABS: 1.0000 | ORAL_TABLET | ORAL | Status: DC | PRN

## 2015-06-13 MED ORDER — ACETAMINOPHEN 325 MG PO TABS
650.0000 mg | ORAL_TABLET | Freq: Four times a day (QID) | ORAL | Status: DC | PRN
  Administered 2015-06-16: 650 mg via ORAL
  Filled 2015-06-13: qty 2

## 2015-06-13 MED ORDER — INSULIN DETEMIR 100 UNIT/ML ~~LOC~~ SOLN
40.0000 [IU] | Freq: Every day | SUBCUTANEOUS | Status: DC
  Administered 2015-06-14 – 2015-06-15 (×3): 40 [IU] via SUBCUTANEOUS
  Filled 2015-06-13 (×3): qty 0.4

## 2015-06-13 MED ORDER — INSULIN DETEMIR 100 UNIT/ML ~~LOC~~ SOLN: 40.0000 [IU] | Freq: Every day | SUBCUTANEOUS | Status: DC

## 2015-06-13 MED ORDER — DIPHENHYDRAMINE HCL 25 MG PO CAPS
ORAL_CAPSULE | ORAL | Status: AC
  Administered 2015-06-13: 25 mg via ORAL
  Filled 2015-06-13: qty 1

## 2015-06-13 MED ORDER — FENTANYL 50 MCG/HR TD PT72: 50.0000 ug | MEDICATED_PATCH | TRANSDERMAL | Status: DC

## 2015-06-13 MED ORDER — PANTOPRAZOLE SODIUM 40 MG PO TBEC
40.0000 mg | DELAYED_RELEASE_TABLET | Freq: Every day | ORAL | Status: DC
  Administered 2015-06-13 – 2015-06-16 (×4): 40 mg via ORAL
  Filled 2015-06-13 (×4): qty 1

## 2015-06-13 MED ORDER — INSULIN ASPART 100 UNIT/ML ~~LOC~~ SOLN
0.0000 [IU] | Freq: Every day | SUBCUTANEOUS | Status: DC
  Administered 2015-06-14: 2 [IU] via SUBCUTANEOUS
  Administered 2015-06-15: 3 [IU] via SUBCUTANEOUS
  Filled 2015-06-13: qty 3
  Filled 2015-06-13: qty 2
  Filled 2015-06-13: qty 3

## 2015-06-13 MED ORDER — SODIUM CHLORIDE 0.9 % IV BOLUS (SEPSIS)
1000.0000 mL | INTRAVENOUS | Status: AC
  Administered 2015-06-13 (×3): 1000 mL via INTRAVENOUS

## 2015-06-13 MED ORDER — FLUTICASONE PROPIONATE 50 MCG/ACT NA SUSP: 1.0000 | Freq: Every day | NASAL | Status: DC | PRN

## 2015-06-13 MED ORDER — BUDESONIDE 0.25 MG/2ML IN SUSP
0.2500 mg | Freq: Two times a day (BID) | RESPIRATORY_TRACT | Status: DC
  Administered 2015-06-13 – 2015-06-16 (×6): 0.25 mg via RESPIRATORY_TRACT
  Filled 2015-06-13 (×7): qty 2

## 2015-06-13 MED ORDER — LIDOCAINE 5 % EX PTCH
1.0000 | MEDICATED_PATCH | CUTANEOUS | Status: DC
  Administered 2015-06-14 – 2015-06-15 (×3): 1 via TRANSDERMAL
  Filled 2015-06-13 (×5): qty 1

## 2015-06-13 MED ORDER — DIPHENHYDRAMINE HCL 25 MG PO CAPS
25.0000 mg | ORAL_CAPSULE | Freq: Once | ORAL | Status: AC
  Administered 2015-06-13: 25 mg via ORAL

## 2015-06-13 MED ORDER — ACETAMINOPHEN 650 MG RE SUPP: 650.0000 mg | Freq: Four times a day (QID) | RECTAL | Status: DC | PRN

## 2015-06-13 MED ORDER — CYANOCOBALAMIN 500 MCG PO TABS
500.0000 ug | ORAL_TABLET | Freq: Every day | ORAL | Status: DC
  Administered 2015-06-14 – 2015-06-16 (×3): 500 ug via ORAL
  Filled 2015-06-13 (×4): qty 1

## 2015-06-13 MED ORDER — METOPROLOL TARTRATE 25 MG PO TABS
25.0000 mg | ORAL_TABLET | Freq: Two times a day (BID) | ORAL | Status: DC
  Administered 2015-06-14 – 2015-06-16 (×5): 25 mg via ORAL
  Filled 2015-06-13 (×5): qty 1

## 2015-06-13 NOTE — ED Provider Notes (Signed)
[facility] Emergency Department Provider Note  ____________________________________________  Time seen: Approximately 5:51 PM  I have reviewed the triage vital signs and the nursing notes.   HISTORY  Chief Complaint Abnormal Lab    HPI Eric Glover is a 70 y.o. male with a complicated PMH that includes admission about a month ago for sepsis and AMS secondary to strep bacteremia which later developed into lumbar discitis, now with an indwelling PICC line, receiving daily ceftriaxone 2g IV (now in the fifth week of treatment), and who is managed by Dr. Ola Spurr as well as his PCP.  He presents today after he received a call from Dr. Ola Spurr and was told to come to the ED for new elevation of liver enzymes found on his weekly surveillence labs.  Last week his labs were within normal limits, but the blood drawn yesterday resulted today and were concerning.  By coincidence, the patient was just leaving a reassuring follow up appointment with his PCP when he received the call.    He reports that he has been having some intermittent mild to moderate generalized abdominal pain recently, but otherwise his symptoms are stable.  Occasional nausea and one episode of emesis.  Denies fever/chills, CP, SOB.  Nothing makes symptoms better, nothing makes them worse.  No correlation of abdominal pain with eating.  Describes pain as sharp.  Family discloses that the patient has not been eating or drinking well recently; he was drinking plenty of water after leaving the hospital, but that has declined significantly because the patient states it makes him need to urinate more and he does not like to do so given his back pain, so he purposefully has been drinking less.   Past Medical History  Diagnosis Date  . Diabetes mellitus without complication ([place])   . GERD (gastroesophageal reflux disease)   . Hyperlipidemia   . Hypertension   . CKD (chronic kidney disease), stage III    . Gout   . Staph infection 2017  . Discitis of lumbosacral region   . Discitis of lumbosacral region   . Pulmonary embolism ([place])   . Atrial fibrillation [facility])     Patient Active Problem List   Diagnosis Date Noted  . Acute renal failure ([place]) 06/13/2015  . Pressure ulcer 06/13/2015  . A-fib ([place]) 05/30/2015  . Bacteremia due to Gram-positive bacteria 05/24/2015  . DDD (degenerative disc disease), lumbar 05/09/2015  . Sciatica associated with disorder of lumbar spine 05/09/2015  . HTN (hypertension) 05/06/2015  . Acute on chronic kidney failure ([place]) 05/06/2015  . Low back pain 05/06/2015  . Diabetes mellitus with neuropathy ([place]) 01/11/2015  . Benign hypertensive kidney disease 05/31/2010  . Chronic kidney disease (CKD), stage III (moderate) 05/31/2010  . Gout 05/31/2010  . HLD (hyperlipidemia) 05/31/2010  . Type 2 diabetes mellitus ([place]) 05/31/2010    Past Surgical History  Procedure Laterality Date  . Shoulder arthroscopy w/ rotator cuff repair    . Tee without cardioversion N/A 05/15/2015    Procedure: TRANSESOPHAGEAL ECHOCARDIOGRAM (TEE);  Surgeon: Teodoro Spray, MD;  Location: [facility] ORS;  Service: Cardiovascular;  Laterality: N/A;    No current outpatient prescriptions on file.  Allergies Neomycin-bacitracin zn-polymyx  Family History  Problem Relation Age of Onset  . Alcohol abuse Paternal Uncle   . Early death Paternal Uncle   . Arthritis Mother   . Cancer Mother   . Diabetes Mother   . Hearing loss Mother   . Heart disease Mother   .  Hyperlipidemia Mother   . Hypertension Mother   . Kidney disease Mother   . Diabetes Father   . Hearing loss Father   . Congestive Heart Failure Father     Social History Social History  Substance Use Topics  . Smoking status: Never Smoker   . Smokeless tobacco: None  . Alcohol Use: No    Review of Systems Constitutional: No fever/chills Eyes: No visual changes. ENT: No sore throat. Cardiovascular: Denies  chest pain. Respiratory: Denies shortness of breath. Gastrointestinal: intermittent generalized abdominal pain.  Occasional nausea and vomiting.  No diarrhea.  No constipation. Genitourinary: Negative for dysuria. Musculoskeletal: chronic low back pain in setting of known diskitis Skin: Negative for rash. Neurological: Negative for headaches, focal weakness or numbness.  10-point ROS otherwise negative.  ____________________________________________   PHYSICAL EXAM:  VITAL SIGNS: ED Triage Vitals  Enc Vitals Group     BP 06/13/15 1729 98/58 mmHg     Pulse Rate 06/13/15 1729 70     Resp 06/13/15 1729 20     Temp 06/13/15 1729 97.8 F (36.6 C)     Temp Source 06/13/15 1729 Oral     SpO2 06/13/15 1729 95 %     Weight 06/13/15 1729 204 lb (92.534 kg)     Height 06/13/15 1729 6\' 2"  (1.88 m)     Head Cir --      Peak Flow --      Pain Score 06/13/15 1731 3     Pain Loc --      Pain Edu? --      Excl. in [place]? --     Constitutional: Alert and oriented. Well appearing and in no acute distress. Eyes: Conjunctivae are normal. PERRL. EOMI. Head: Atraumatic. Nose: No congestion/rhinnorhea. Mouth/Throat: Mucous membranes are dry.  Oropharynx non-erythematous. Neck: No stridor.  No meningeal signs.   Cardiovascular: Normal rate, regular rhythm. Good peripheral circulation. Grossly normal heart sounds.   Respiratory: Normal respiratory effort.  No retractions. Lungs CTAB. Gastrointestinal: Soft and nontender. No distention.  Musculoskeletal: No lower extremity tenderness nor edema. No gross deformities of extremities. Neurologic:  Normal speech and language. No gross focal neurologic deficits are appreciated.  Skin:  Skin is warm, dry and intact. No rash noted. Psychiatric: Mood and affect are normal. Speech and behavior are normal.  ____________________________________________   LABS (all labs ordered are listed, but only abnormal results are displayed)  Labs Reviewed  LACTIC  ACID, PLASMA - Abnormal; Notable for the following:    Lactic Acid, Venous 2.8 (*)    All other components within normal limits  COMPREHENSIVE METABOLIC PANEL - Abnormal; Notable for the following:    Sodium 134 (*)    Chloride 99 (*)    Glucose, Bld 268 (*)    BUN 28 (*)    Creatinine, Ser 2.26 (*)    Calcium 8.7 (*)    Albumin 3.1 (*)    AST 284 (*)    ALT 493 (*)    Alkaline Phosphatase 371 (*)    Total Bilirubin 1.6 (*)    GFR calc non Af Amer 28 (*)    GFR calc Af Amer 32 (*)    All other components within normal limits  CBC WITH DIFFERENTIAL/PLATELET - Abnormal; Notable for the following:    RBC 4.29 (*)    Hemoglobin 11.4 (*)    HCT 34.3 (*)    RDW 15.9 (*)    Neutro Abs 7.3 (*)    Lymphs Abs 0.8 (*)  All other components within normal limits  APTT - Abnormal; Notable for the following:    aPTT 40 (*)    All other components within normal limits  PROTIME-INR - Abnormal; Notable for the following:    Prothrombin Time 18.8 (*)    All other components within normal limits  GLUCOSE, CAPILLARY - Abnormal; Notable for the following:    Glucose-Capillary 155 (*)    All other components within normal limits  CULTURE, BLOOD (ROUTINE X 2)  CULTURE, BLOOD (ROUTINE X 2)  URINE CULTURE  LACTIC ACID, PLASMA  LIPASE, BLOOD  TROPONIN I  URINALYSIS COMPLETEWITH MICROSCOPIC ([facility] ONLY)  CBC  COMPREHENSIVE METABOLIC PANEL   ____________________________________________  EKG  ED ECG REPORT I, Loreena Valeri, the attending physician, personally viewed and interpreted this ECG.   Date: 06/13/2015  EKG Time: 19:48  Rate: 66  Rhythm: normal sinus rhythm  Axis: normal  Intervals:none  ST&T Change: None  ____________________________________________  RADIOLOGY   Dg Chest Port 1 View  06/13/2015  CLINICAL DATA:  Abnormal laboratory values. Possible liver or gallbladder abnormality. Initial encounter. EXAM: PORTABLE CHEST 1 VIEW COMPARISON:  Single-view of the chest  05/16/2015. FINDINGS: Right PICC is in place with the tip near the superior cavoatrial junction. Lungs are clear. No pneumothorax or pleural effusion. Heart size normal per IMPRESSION: No acute disease. Electronically Signed   By: Inge Rise M.D.   On: 06/13/2015 18:10   US Abdomen Limited Ruq  06/13/2015  CLINICAL DATA:  Elevated liver function studies, right upper quadrant abdominal pain and postprandial nausea for 07-10 days. EXAM: US ABDOMEN LIMITED - RIGHT UPPER QUADRANT COMPARISON:  12/11/2008 FINDINGS: Gallbladder: Large shadowing gallstones in the gallbladder. The largest measures approximately 4.0 x 3.0 x 0.6 cm. No gallbladder wall thickening or pericholecystic fluid. Negative sonographic Murphy sign. Common bile duct: Diameter: 5.8 mm Liver: Normal echogenicity without focal lesion or biliary dilatation. IMPRESSION: Large shadowing gallstones in the gallbladder but no sonographic findings for acute cholecystitis. Next item normal Normal [place] common bile duct and normal liver. Electronically Signed   By: Marijo Sanes M.D.   On: 06/13/2015 18:52    ____________________________________________   PROCEDURES  Procedure(s) performed: None  Critical Care performed: Yes, see critical care note(s)   CRITICAL CARE Performed by: Hinda Kehr   Total critical care time: 60 minutes  Critical care time was exclusive of separately billable procedures and treating other patients.  Critical care was necessary to treat or prevent imminent or life-threatening deterioration.  Critical care was time spent personally by me on the following activities: development of treatment plan with patient and/or surrogate as well as nursing, discussions with consultants, evaluation of patient's response to treatment, examination of patient, obtaining history from patient or surrogate, ordering and performing treatments and interventions, ordering and review of laboratory studies, ordering and review of  radiographic studies, pulse oximetry and re-evaluation of patient's condition.  ____________________________________________   INITIAL IMPRESSION / ASSESSMENT AND PLAN / ED COURSE  Pertinent labs & imaging results that were available during my care of the patient were reviewed by me and considered in my medical decision making (see chart for details).  The patient presented hypotensive (similar to prior) but became profoundly hypotensive upon standing with tachycardia in the upper 130s.  Normal mentation.  Afebrile . Given known bacteremia and diskitis, I initiated sepsis protocol orders without calling a code sepsis given that his mentation was good and he was essentially asymptomatic.  I spoke by phone with Dr. Ola Spurr  who confirmed the history.  He was concerned about the possibility of liver dysfunction due to the antibiotics, but also wanted to rule out cholecystitis.  I informed him of the abnormal vital signs as well.  Workup MAY reveal sepsis, but also definitely reveals ARF, likely pre-renal.  Elevated lactate may reflect sepsis, but most likely reflects volume depletion.  As per my discussion with Dr. Ola Spurr, I gave the patient vancomycin 2 g IV but withheld additional empiric antibiotics (per Dr. Blane Ohara request).  I am admitting the patient for the ARF, possible sepsis, new liver dysfunction.  Patient and family agrees.  I anticipate second lactate will drop significantly after 54mL/kg fluid resuscitation.  ____________________________________________  FINAL CLINICAL IMPRESSION(S) / ED DIAGNOSES  Final diagnoses:  Acute renal failure, unspecified acute renal failure type (HCC)  Sepsis, due to unspecified organism ([place])  Elevated lactic acid level  Cholelithiasis without cholecystitis  Acute liver failure      NEW MEDICATIONS STARTED DURING THIS VISIT:  Current Discharge Medication List        Note:  This document was prepared using Dragon voice  recognition software and may include unintentional dictation errors.   Hinda Kehr, MD 06/13/15 [phone]

## 2015-06-13 NOTE — H&P (Signed)
[facility] NAME: Eric Glover    MR#:  [id number]  DATE OF BIRTH:  Feb 09, 1946  DATE OF ADMISSION:  06/13/2015  PRIMARY CARE PHYSICIAN: Ashok Norris, MD   REQUESTING/REFERRING PHYSICIAN: Dr. Les Pou  CHIEF COMPLAINT:   Chief Complaint  Patient presents with  . Abnormal Lab    HISTORY OF PRESENT ILLNESS:  Eric Glover  is a 70 y.o. male sent in for admission for abnormal laboratory data by Dr. Ola Spurr. Patient was in the hospital for Streptococcus mutans sepsis. He was set up with IV Rocephin as outpatient. A repeat MRI of the lumbar sacral spine as outpatient showed evidence of discitis. His IV Rocephin course was increased in duration. On routine blood testing done yesterday by Dr. Ola Spurr, his AST was elevated at 500 ALT 585 and alkaline phosphatase at 415. Repeat blood testing here in the ER showed an AST of 284 and ALT of 493 and alkaline phosphatase of 371. But also on today's kidney function it is worse at 2.26. The patient stopped eating and drinking and he feels nauseous every time he does eat and drink. He still having back pain 1-2 out of 10 at rest and up to 6-7 out of 10 in intensity when he walks. Today when he saw his physician and his blood pressure was low when he stood up. Initially when he came into the ER his blood pressure was low also. He did respond to IV fluid bolus.  PAST MEDICAL HISTORY:   Past Medical History  Diagnosis Date  . Diabetes mellitus without complication ([place])   . GERD (gastroesophageal reflux disease)   . Hyperlipidemia   . Hypertension   . CKD (chronic kidney disease), stage III   . Gout   . Staph infection 2017  . Discitis of lumbosacral region   . Discitis of lumbosacral region     PAST SURGICAL HISTORY:   Past Surgical History  Procedure Laterality Date  . Shoulder arthroscopy w/ rotator cuff repair    . Tee without cardioversion N/A 05/15/2015   Procedure: TRANSESOPHAGEAL ECHOCARDIOGRAM (TEE);  Surgeon: Teodoro Spray, MD;  Location: [facility] ORS;  Service: Cardiovascular;  Laterality: N/A;    SOCIAL HISTORY:   Social History  Substance Use Topics  . Smoking status: Never Smoker   . Smokeless tobacco: Not on file  . Alcohol Use: No    FAMILY HISTORY:   Family History  Problem Relation Age of Onset  . Alcohol abuse Paternal Uncle   . Early death Paternal Uncle   . Arthritis Mother   . Cancer Mother   . Diabetes Mother   . Hearing loss Mother   . Heart disease Mother   . Hyperlipidemia Mother   . Hypertension Mother   . Kidney disease Mother   . Diabetes Father   . Hearing loss Father   . Congestive Heart Failure Father     DRUG ALLERGIES:   Allergies  Allergen Reactions  . Neomycin-Bacitracin Zn-Polymyx Rash    REVIEW OF SYSTEMS:  CONSTITUTIONAL: No fever, Positive for fatigue. Positive for weight loss 30 pounds since all this started. EYES: No blurred or double vision. Wears glasses. EARS, NOSE, AND THROAT: No tinnitus or ear pain. No sore throat. Wears hearing aid. RESPIRATORY: No cough, occasional shortness of breath, no wheezing or hemoptysis.  CARDIOVASCULAR: No chest pain, orthopnea, edema.  GASTROINTESTINAL: Positive for nausea, vomiting and lower abdominal pain. No blood in bowel movements. History of constipation  GENITOURINARY: No dysuria, hematuria.  ENDOCRINE: No polyuria, nocturia,  HEMATOLOGY: No anemia, easy bruising or bleeding SKIN: Itching around the PICC line site MUSCULOSKELETAL: Low back pain.   NEUROLOGIC: No tingling, numbness, weakness.  PSYCHIATRY: No anxiety or depression.   MEDICATIONS AT HOME:   Prior to Admission medications   Medication Sig Start Date End Date Taking? Authorizing Provider  allopurinol (ZYLOPRIM) 100 MG tablet Take 100 mg by mouth daily.    Yes Historical Provider, MD  amiodarone (PACERONE) 200 MG tablet Take 200 mg by mouth daily.   Yes Historical Provider, MD   apixaban (ELIQUIS) 5 MG TABS tablet Take 5 mg by mouth 2 (two) times daily.   Yes Historical Provider, MD  aspirin EC 81 MG tablet Take 81 mg by mouth at bedtime.    Yes Historical Provider, MD  beclomethasone (QVAR) 80 MCG/ACT inhaler Inhale 2 puffs into the lungs 2 (two) times daily as needed (for shortness of breath).   Yes Historical Provider, MD  colchicine (COLCRYS) 0.6 MG tablet Take 0.6 mg by mouth daily as needed (for gout flares).    Yes Historical Provider, MD  doxazosin (CARDURA) 4 MG tablet Take 4 mg by mouth at bedtime.   Yes Historical Provider, MD  fentaNYL (DURAGESIC - DOSED MCG/HR) 50 MCG/HR Place 1 patch (50 mcg total) onto the skin every 3 (three) days. 06/13/15  Yes Steele Sizer, MD  fluticasone (FLONASE) 50 MCG/ACT nasal spray Place 1-2 sprays into both nostrils daily as needed for rhinitis.   Yes Historical Provider, MD  insulin aspart (NOVOLOG) 100 UNIT/ML injection Inject 4-12 Units into the skin 3 (three) times daily with meals as needed for high blood sugar. Pt uses as needed per sliding scale.   Yes Historical Provider, MD  insulin detemir (LEVEMIR) 100 UNIT/ML injection Inject 0.4 mLs (40 Units total) into the skin at bedtime. 06/13/15  Yes Steele Sizer, MD  lansoprazole (PREVACID) 15 MG capsule Take 15 mg by mouth daily.    Yes Historical Provider, MD  lidocaine (LIDODERM) 5 % Place 1 patch onto the skin daily. Remove & Discard patch within 12 hours or as directed by MD 05/17/15  Yes Loletha Grayer, MD  metoprolol tartrate (LOPRESSOR) 25 MG tablet Take 1 tablet (25 mg total) by mouth 2 (two) times daily. 05/17/15  Yes Loletha Grayer, MD  nystatin (MYCOSTATIN) 100000 UNIT/ML suspension Take 5 mLs (500,000 Units total) by mouth 4 (four) times daily. 05/17/15  Yes Loletha Grayer, MD  oxyCODONE-acetaminophen (PERCOCET) 5-325 MG tablet Take 1 tablet by mouth every 4 (four) hours as needed for severe pain. 06/13/15  Yes Steele Sizer, MD  polyethylene glycol (MIRALAX /  GLYCOLAX) packet Take 17 g by mouth daily as needed for mild constipation.   Yes Historical Provider, MD  simvastatin (ZOCOR) 80 MG tablet Take 80 mg by mouth at bedtime.    Yes Historical Provider, MD  vitamin B-12 (CYANOCOBALAMIN) 500 MCG tablet Take 500 mcg by mouth daily.    Yes Historical Provider, MD  cefTRIAXone 2 g in dextrose 5 % 50 mL Inject 2 g into the vein daily. Patient not taking: Reported on 06/13/2015 05/17/15   Loletha Grayer, MD      VITAL SIGNS:  Blood pressure 137/64, pulse 75, temperature 97.8 F (36.6 C), temperature source Oral, resp. rate 17, height 6\' 2"  (1.88 m), weight 92.534 kg (204 lb), SpO2 97 %.  PHYSICAL EXAMINATION:  GENERAL:  70 y.o.-year-old patient lying in the bed with no acute distress.  EYES: Pupils equal, round, reactive to light and accommodation. No scleral icterus. Extraocular muscles intact.  HEENT: Head atraumatic, normocephalic. Oropharynx and nasopharynx clear.  NECK:  Supple, no jugular venous distention. No thyroid enlargement, no tenderness.  LUNGS: Normal breath sounds bilaterally, no wheezing, rales,rhonchi or crepitation. No use of accessory muscles of respiration.  CARDIOVASCULAR: S1, S2 normal. No murmurs, rubs, or gallops.  ABDOMEN: Soft, nontender, nondistended. Bowel sounds present. No organomegaly or mass.  EXTREMITIES: Trace pedal edema, no cyanosis, or clubbing.  NEUROLOGIC: Cranial nerves II through XII are intact. Muscle strength 5/5 in all extremities. Sensation intact. Gait not checked.  PSYCHIATRIC: The patient is alert and oriented x 3.  SKIN: Secondary excoriations right upper arm  LABORATORY PANEL:   CBC  Recent Labs Lab 06/13/15 1757  WBC 8.8  HGB 11.4*  HCT 34.3*  PLT 321   ------------------------------------------------------------------------------------------------------------------  Chemistries   Recent Labs Lab 06/13/15 1757  NA 134*  K 4.8  CL 99*  CO2 22  GLUCOSE 268*  BUN 28*  CREATININE  2.26*  CALCIUM 8.7*  AST 284*  ALT 493*  ALKPHOS 371*  BILITOT 1.6*   ------------------------------------------------------------------------------------------------------------------  Cardiac Enzymes  Recent Labs Lab 06/13/15 1757  TROPONINI <0.03   ------------------------------------------------------------------------------------------------------------------  RADIOLOGY:  Dg Chest Port 1 View  06/13/2015  CLINICAL DATA:  Abnormal laboratory values. Possible liver or gallbladder abnormality. Initial encounter. EXAM: PORTABLE CHEST 1 VIEW COMPARISON:  Single-view of the chest 05/16/2015. FINDINGS: Right PICC is in place with the tip near the superior cavoatrial junction. Lungs are clear. No pneumothorax or pleural effusion. Heart size normal per IMPRESSION: No acute disease. Electronically Signed   By: Inge Rise M.D.   On: 06/13/2015 18:10   US Abdomen Limited Ruq  06/13/2015  CLINICAL DATA:  Elevated liver function studies, right upper quadrant abdominal pain and postprandial nausea for 07-10 days. EXAM: US ABDOMEN LIMITED - RIGHT UPPER QUADRANT COMPARISON:  12/11/2008 FINDINGS: Gallbladder: Large shadowing gallstones in the gallbladder. The largest measures approximately 4.0 x 3.0 x 0.6 cm. No gallbladder wall thickening or pericholecystic fluid. Negative sonographic Murphy sign. Common bile duct: Diameter: 5.8 mm Liver: Normal echogenicity without focal lesion or biliary dilatation. IMPRESSION: Large shadowing gallstones in the gallbladder but no sonographic findings for acute cholecystitis. Next item normal Normal Caliber common bile duct and normal liver. Electronically Signed   By: Marijo Sanes M.D.   On: 06/13/2015 18:52    IMPRESSION AND PLAN:   1. Acute renal failure on chronic kidney disease stage III. IV fluid hydration and continue to monitor. Hold nephrotoxic medication. 2. Increased liver function test. Hold Rocephin. Hold amiodarone. Hold Zocor. Recheck liver  function test tomorrow morning. Likely this is medication related. 3. Streptococcus mutans discitis. ER physician gave a dose of vancomycin 2 g 1. That should give [place] enough time till infectious disease doctor Dr. Ola Spurr sees him tomorrow morning to decide on antibiotics. 4. History of pulmonary embolism on eliquis 5. Type 2 diabetes mellitus continue usual medications and sliding scale 6. History of Atrial fibrillation. I am holding amiodarone at this point. Continue metoprolol. Obtain EKG. 7. Hypotension. Give IV fluid hydration. Hold Cardura. Try to continue metoprolol.    All the records are reviewed and case discussed with ED provider. Management plans discussed with the patient, family and they are in agreement.  CODE STATUS: Full code  TOTAL TIME TAKING CARE OF THIS PATIENT: 55 minutes.    Loletha Grayer M.D on 06/13/2015 at 8:58 PM  Between  7am to 6pm - Pager - [phone]  After 6pm call admission pager [facility]  Office  [phone]  CC: Primary care physician; Ashok Norris, MD

## 2015-06-13 NOTE — ED Notes (Signed)
Pt sent to the ED from home by PCP for abnormal labs .Marland Kitchen Pt is getting home abx for strep infection through a pick line.. States he had routine labs drawn yesterday and they called today stating there was some issue with liver or gall bladder. Pt is hypotensive in triage.

## 2015-06-13 NOTE — Patient Instructions (Signed)
We will decrease Cardura from 8 mg to half pill daily because of low blood pressure. Monitor bp at home and call back with a log. We may need to stop Cardura   Try switching back to Duragesic patch 25 every 3 days the week prior to your follow up visit.

## 2015-06-13 NOTE — Progress Notes (Signed)
Name: Eric Glover   MRN: [id number]    DOB: 1945/11/23   Date:06/13/2015       Progress Note  Subjective  Chief Complaint  No chief complaint on file.   HPI  Diskitis - infection of vertebral spine: he is feeling better, pain under control, still getting antibiotics through the pic line, getting nauseated, but no longer losing weight.   Afib: new onset during hospitalization, now seen by cardiologist since discharge, taking medication as prescribed. He has a follow up  with his cardiologist, Dr. Clayborn Bigness this week, he denies chest pain but has some SOB. BP is low today - usually in the 100's, but feeling well  DMII: glucose is finally getting back to normal, off steroids. FSBS in am's between 100-130's. No polyphagia, polydipsia or polyuria.    Patient Active Problem List   Diagnosis Date Noted  . A-fib ([place]) 05/30/2015  . Bacteremia due to Gram-positive bacteria 05/24/2015  . DDD (degenerative disc disease), lumbar 05/09/2015  . Sciatica associated with disorder of lumbar spine 05/09/2015  . HTN (hypertension) 05/06/2015  . Acute on chronic kidney failure ([place]) 05/06/2015  . Low back pain 05/06/2015  . Diabetes mellitus with neuropathy ([place]) 01/11/2015  . Benign hypertensive kidney disease 05/31/2010  . Chronic kidney disease (CKD), stage III (moderate) 05/31/2010  . Gout 05/31/2010  . HLD (hyperlipidemia) 05/31/2010  . Type 2 diabetes mellitus ([place]) 05/31/2010    Past Surgical History  Procedure Laterality Date  . Shoulder arthroscopy w/ rotator cuff repair    . Tee without cardioversion N/A 05/15/2015    Procedure: TRANSESOPHAGEAL ECHOCARDIOGRAM (TEE);  Surgeon: Teodoro Spray, MD;  Location: [facility] ORS;  Service: Cardiovascular;  Laterality: N/A;    Family History  Problem Relation Age of Onset  . Alcohol abuse Paternal Uncle   . Early death Paternal Uncle   . Arthritis Mother   . Cancer Mother   . Diabetes Mother   . Hearing loss Mother   . Heart disease  Mother   . Hyperlipidemia Mother   . Hypertension Mother   . Kidney disease Mother   . Diabetes Father   . Hearing loss Father     Social History   Social History  . Marital Status: Married    Spouse Name: N/A  . Number of Children: N/A  . Years of Education: N/A   Occupational History  . Not on file.   Social History Main Topics  . Smoking status: Never Smoker   . Smokeless tobacco: Not on file  . Alcohol Use: No  . Drug Use: No  . Sexual Activity: Not on file   Other Topics Concern  . Not on file   Social History Narrative     Current outpatient prescriptions:  .  allopurinol (ZYLOPRIM) 100 MG tablet, Take 100 mg by mouth., Disp: , Rfl:  .  amiodarone (PACERONE) 200 MG tablet, 2 tabs daily for four days then one tab daily afterwards, Disp: 34 tablet, Rfl: 0 .  apixaban (ELIQUIS) 5 MG TABS tablet, 2 tabs oral  twice a day for four more days then 1 tab twice a day, Disp: 60 tablet, Rfl: 0 .  aspirin EC 81 MG tablet, Take 81 mg by mouth., Disp: , Rfl:  .  beclomethasone (QVAR) 80 MCG/ACT inhaler, Inhale 2 puffs into the lungs 2 (two) times daily. (Patient taking differently: Inhale 2 puffs into the lungs 2 (two) times daily as needed (shortness of breath). ), Disp: 1 Inhaler, Rfl: 12 .  cefTRIAXone (ROCEPHIN) 2-2.22 GM-% IVPB, Inject into the vein., Disp: , Rfl:  .  cefTRIAXone 2 g in dextrose 5 % 50 mL, Inject 2 g into the vein daily., Disp: 7 Dose, Rfl: 0 .  colchicine (COLCRYS) 0.6 MG tablet, Take 0.6 mg by mouth daily as needed (gout flair). , Disp: , Rfl:  .  doxazosin (CARDURA) 8 MG tablet, Take 4 mg by mouth at bedtime. Decrease to half daily , Disp: , Rfl:  .  fentaNYL (DURAGESIC - DOSED MCG/HR) 50 MCG/HR, Place 1 patch (50 mcg total) onto the skin every 3 (three) days., Disp: 10 patch, Rfl: 0 .  fluticasone (FLONASE) 50 MCG/ACT nasal spray, Place 2 sprays into both nostrils daily. (Patient taking differently: Place 2 sprays into both nostrils daily as needed for  allergies or rhinitis. ), Disp: 16 g, Rfl: 6 .  furosemide (LASIX) 20 MG tablet, Take 20 mg by mouth daily. , Disp: , Rfl:  .  insulin aspart (NOVOLOG) 100 UNIT/ML injection, Inject 4-12 Units into the skin 3 (three) times daily before meals. Per sliding scale - per [facility], Disp: 10 mL, Rfl: 11 .  insulin detemir (LEVEMIR) 100 UNIT/ML injection, Inject 0.4 mLs (40 Units total) into the skin at bedtime., Disp: 10 mL, Rfl: 11 .  lansoprazole (PREVACID) 15 MG capsule, Take 15 mg by mouth daily. , Disp: , Rfl:  .  lidocaine (LIDODERM) 5 %, Place 1 patch onto the skin daily. Remove & Discard patch within 12 hours or as directed by MD, Disp: 30 patch, Rfl: 0 .  metoprolol tartrate (LOPRESSOR) 25 MG tablet, Take 1 tablet (25 mg total) by mouth 2 (two) times daily., Disp: 60 tablet, Rfl: 0 .  nystatin (MYCOSTATIN) 100000 UNIT/ML suspension, Take 5 mLs (500,000 Units total) by mouth 4 (four) times daily., Disp: 60 mL, Rfl: 0 .  oxyCODONE-acetaminophen (PERCOCET) 5-325 MG tablet, Take 1 tablet by mouth every 4 (four) hours as needed for severe pain., Disp: 30 tablet, Rfl: 0 .  polyethylene glycol (MIRALAX / GLYCOLAX) packet, Take 17 g by mouth daily as needed (constipation)., Disp: 30 each, Rfl: 0 .  simvastatin (ZOCOR) 80 MG tablet, Take 80 mg by mouth daily at 6 PM. , Disp: , Rfl:  .  vitamin B-12 (CYANOCOBALAMIN) 500 MCG tablet, Take 500 mcg by mouth daily. , Disp: , Rfl:   Allergies  Allergen Reactions  . Neomycin-Bacitracin Zn-Polymyx Rash     ROS  Constitutional: Negative for fever or significant weight change.  Respiratory: Negative for cough and shortness of breath.   Cardiovascular: Negative for chest pain or palpitations.  Gastrointestinal: Negative for abdominal pain, no bowel changes.  Musculoskeletal: Positive  for gait problem no  joint swelling - weak.  Skin: Negative for rash.  Neurological: Negative for dizziness or headache.  No other specific complaints in a complete review of  systems (except as listed in HPI above).  Objective  Filed Vitals:   06/13/15 1453 06/13/15 1454 06/13/15 1455  BP: 96/56 92/58 87/48   Pulse: 72    Temp: 98 F (36.7 C)    TempSrc: Oral    Resp: 12    Height: 6\' 2"  (1.88 m)    Weight: 201 lb (91.173 kg)    SpO2: 91%      Body mass index is 25.8 kg/(m^2).  Physical Exam  Constitutional: Patient is in no distress, sitting on wheelchair and no longer looking pale.   HEENT: head atraumatic, normocephalic, pupils equal and reactive to light, neck  supple, throat within normal limits Cardiovascular: Normal rate, irregular rhythm ( not currently in afib). No murmur heard. No BLE edema. Pulmonary/Chest: Effort normal and breath sounds normal. No respiratory distress. Abdominal: Soft. There is no tenderness. Psychiatric: Patient has a normal mood and affect. behavior is normal. Judgment and thought content normal. Muscular skeletal: pain during palpation of lumbar spine, negative straight leg raise.     PHQ2/9: Depression screen [facility] 2/9 06/13/2015 02/10/2015 01/10/2015  Decreased Interest 0 0 0  Down, Depressed, Hopeless 0 0 0  PHQ - 2 Score 0 0 0     Fall Risk: Fall Risk  06/13/2015 05/09/2015 02/10/2015 01/10/2015  Falls in the past year? No No No No     Functional Status Survey: Is the patient deaf or have difficulty hearing?: No Does the patient have difficulty seeing, even when wearing glasses/contacts?: No Does the patient have difficulty concentrating, remembering, or making decisions?: No Does the patient have difficulty walking or climbing stairs?: Yes (back pain) Does the patient have difficulty dressing or bathing?: Yes (needs help bathing due to shower chair for back pain) Does the patient have difficulty doing errands alone such as visiting a doctor's office or shopping?: Yes   Assessment & Plan  1. Discitis of lumbar region  Doing better on current regiment, taking percocet at most once a day now. He has  some nausea, not having constipation since he has been taking Miralax - oxyCODONE-acetaminophen (PERCOCET) 5-325 MG tablet; Take 1 tablet by mouth every 4 (four) hours as needed for severe pain.  Dispense: 30 tablet; Refill: 0 - fentaNYL (DURAGESIC - DOSED MCG/HR) 50 MCG/HR; Place 1 patch (50 mcg total) onto the skin every 3 (three) days.  Dispense: 10 patch; Refill: 0  2. Bacteremia due to Gram-positive bacteria  Continue follow up with Dr. Ola Spurr  3. Type 2 diabetes mellitus with diabetic neuropathy, with long-term current use of insulin ([facility])  No recent episodes of hypoglycemia - insulin detemir (LEVEMIR) 100 UNIT/ML injection; Inject 0.4 mLs (40 Units total) into the skin at bedtime.  Dispense: 10 mL; Refill: 11 - insulin aspart (NOVOLOG) 100 UNIT/ML injection; Inject 4-12 Units into the skin 3 (three) times daily before meals. Per sliding scale - per [facility]  Dispense: 10 mL; Refill: 11  4. Orthostatic hypotension  Decrease Cardura

## 2015-06-14 DIAGNOSIS — R945 Abnormal results of liver function studies: Secondary | ICD-10-CM

## 2015-06-14 DIAGNOSIS — K7200 Acute and subacute hepatic failure without coma: Secondary | ICD-10-CM

## 2015-06-14 DIAGNOSIS — R7989 Other specified abnormal findings of blood chemistry: Secondary | ICD-10-CM

## 2015-06-14 DIAGNOSIS — K8020 Calculus of gallbladder without cholecystitis without obstruction: Secondary | ICD-10-CM

## 2015-06-14 LAB — URINALYSIS COMPLETE WITH MICROSCOPIC (ARMC ONLY)
Bilirubin Urine: NEGATIVE
Glucose, UA: >500 mg/dL — AB
KETONES UR: NEGATIVE mg/dL
Leukocytes, UA: NEGATIVE
NITRITE: NEGATIVE
PH: 5 (ref 5.0–8.0)
PROTEIN: 30 mg/dL — AB
SPECIFIC GRAVITY, URINE: 1.015 (ref 1.005–1.030)
Squamous Epithelial / LPF: NONE SEEN

## 2015-06-14 LAB — GLUCOSE, CAPILLARY
GLUCOSE-CAPILLARY: 247 mg/dL — AB (ref 65–99)
GLUCOSE-CAPILLARY: 220 mg/dL — AB (ref 65–99)
Glucose-Capillary: 183 mg/dL — ABNORMAL HIGH (ref 65–99)
Glucose-Capillary: 246 mg/dL — ABNORMAL HIGH (ref 65–99)

## 2015-06-14 LAB — COMPREHENSIVE METABOLIC PANEL
ALBUMIN: 2.2 g/dL — AB (ref 3.5–5.0)
ALK PHOS: 260 U/L — AB (ref 38–126)
ALT: 285 U/L — AB (ref 17–63)
AST: 125 U/L — ABNORMAL HIGH (ref 15–41)
Anion gap: 1 — ABNORMAL LOW (ref 5–15)
BILIRUBIN TOTAL: 0.7 mg/dL (ref 0.3–1.2)
BUN: 26 mg/dL — ABNORMAL HIGH (ref 6–20)
CALCIUM: 7.6 mg/dL — AB (ref 8.9–10.3)
CO2: 25 mmol/L (ref 22–32)
CREATININE: 1.65 mg/dL — AB (ref 0.61–1.24)
Chloride: 109 mmol/L (ref 101–111)
GFR, EST AFRICAN AMERICAN: 47 mL/min — AB (ref >60)
GFR, EST NON AFRICAN AMERICAN: 41 mL/min — AB (ref >60)
Glucose, Bld: 248 mg/dL — ABNORMAL HIGH (ref 65–99)
Potassium: 4.5 mmol/L (ref 3.5–5.1)
SODIUM: 135 mmol/L (ref 135–145)
Total Protein: 5.4 g/dL — ABNORMAL LOW (ref 6.5–8.1)

## 2015-06-14 LAB — CBC
HCT: 27.7 % — ABNORMAL LOW (ref 40.0–52.0)
Hemoglobin: 9.2 g/dL — ABNORMAL LOW (ref 13.0–18.0)
MCH: 26.4 pg (ref 26.0–34.0)
MCHC: 33.1 g/dL (ref 32.0–36.0)
MCV: 79.5 fL — ABNORMAL LOW (ref 80.0–100.0)
PLATELETS: 261 10*3/uL (ref 150–440)
RBC: 3.48 MIL/uL — AB (ref 4.40–5.90)
RDW: 15.7 % — ABNORMAL HIGH (ref 11.5–14.5)
WBC: 6.6 10*3/uL (ref 3.8–10.6)

## 2015-06-14 LAB — SEDIMENTATION RATE: SED RATE: 61 mm/h — AB (ref 0–20)

## 2015-06-14 MED ORDER — DIPHENHYDRAMINE HCL 25 MG PO CAPS
25.0000 mg | ORAL_CAPSULE | Freq: Three times a day (TID) | ORAL | Status: DC | PRN
  Administered 2015-06-14: 25 mg via ORAL
  Filled 2015-06-14 (×2): qty 1

## 2015-06-14 NOTE — Progress Notes (Signed)
Inpatient Diabetes Program Recommendations  AACE/ADA: New Consensus Statement on Inpatient Glycemic Control (2015)  Target Ranges:  Prepandial:   less than 140 mg/dL      Peak postprandial:   less than 180 mg/dL (1-2 hours)      Critically ill patients:  140 - 180 mg/dL  Results for Eric Glover, Eric Glover (MRN [id number]) as of 06/14/2015 13:14  Ref. Range 05/17/2015 11:43 06/07/2015 14:34 06/13/2015 23:03 06/14/2015 07:52 06/14/2015 11:56  Glucose-Capillary Latest Ref Range: 65-99 mg/dL 257 (H) 230 (H) 155 (H) 183 (H) 247 (H)   Diabetes history: DM2  Outpatient Diabetes medications: Levemir 40 units + Novolog 4-12 units tid ac meals Current orders for Inpatient glycemic control: Levemir 40 units q d + Novolog sensitive correction qid  Inpatient Diabetes Program Recommendations:  Please note elevated blood sugars and consider adding meal coverage of 4 units tid. To be held if pt. Eats less than 50%.  Thank you, Nani Gasser. Eric Christianson, RN, MSN, CDE Inpatient Glycemic Control Team Team Pager [phone] (8am-5pm) 06/14/2015 1:17 PM

## 2015-06-14 NOTE — Care Management (Signed)
Patient is active with [facility] for nursing and physical therapy. He has a PICC for IV therapy. I have asked [facility] with LTAC to review case to see if patient meets criteria for LTAC.

## 2015-06-14 NOTE — Progress Notes (Signed)
[facility]  Patient Status: Active  [facility] is providing the following services: SN/PT  If patient discharges after hours, please call [phone].   Eric Glover 06/14/2015, 9:00 AM

## 2015-06-14 NOTE — Consult Note (Signed)
Surgical Consultation  06/14/2015  Eric Glover is an 70 y.o. male.   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 He has multiple medical problems and came to the hospital for outpatient labs showed elevated liver function tests with AST ALT in the 400 range. He has known gallstones. He denies jaundice or acholic stools has normal bowel movements every third day which she states is normal for him without melena or hematochezia he's had no recent fevers or chills no nausea vomiting he does have abdominal pain after eating but consistently points to the infraumbilical area in both lower quadrants as the area where he has pain he never points to the right upper quadrant.  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 He has a large scar associated.  I was asked see the patient for elevated liver function tests and known gallstones.  Past Medical History  Diagnosis Date  . Diabetes mellitus without complication ([place])   . GERD (gastroesophageal reflux disease)   . Hyperlipidemia   . Hypertension   . CKD (chronic kidney disease), stage III   . Gout   . Staph infection 2017  . Discitis of lumbosacral region   . Discitis of lumbosacral region   . Pulmonary embolism ([place])   . Atrial fibrillation [facility])     Past Surgical History  Procedure Laterality Date  . Shoulder arthroscopy w/ rotator cuff repair    . Tee without cardioversion N/A 05/15/2015    Procedure: TRANSESOPHAGEAL ECHOCARDIOGRAM (TEE);  Surgeon: Teodoro Spray, MD;  Location: [facility] ORS;  Service: Cardiovascular;  Laterality: N/A;    Family History  Problem Relation Age of Onset  . Alcohol abuse Paternal Uncle   . Early death Paternal Uncle   . Arthritis Mother   . Cancer Mother   . Diabetes Mother   . Hearing loss Mother   . Heart disease Mother   . Hyperlipidemia Mother   .  Hypertension Mother   . Kidney disease Mother   . Diabetes Father   . Hearing loss Father   . Congestive Heart Failure Father     Social History:  reports that he has never smoked. He does not have any smokeless tobacco history on file. He reports that he does not drink alcohol or use illicit drugs.  Allergies:  Allergies  Allergen Reactions  . Neomycin-Bacitracin Zn-Polymyx Rash    Medications reviewed.   Review of Systems:   Review of Systems  Constitutional: Negative for fever and chills.  HENT: Negative.   Eyes: Negative.   Respiratory: Negative.   Cardiovascular: Negative.   Gastrointestinal: Positive for abdominal pain. Negative for heartburn, nausea, vomiting, diarrhea, constipation, blood in stool and melena.       Abdominal pain is consistently in the infraumbilical area both lower quadrants no right upper quadrant pain ever  Genitourinary: Negative.   Musculoskeletal: Negative.   Skin: Negative.   Neurological: Negative.   Endo/Heme/Allergies: Negative.   Psychiatric/Behavioral: Negative.      Physical Exam:  BP 130/49 mmHg  Pulse 59  Temp(Src) 98.1 F (36.7 C) (Oral)  Resp 19  Ht 6' 2"  (1.88 m)  Wt 204 lb (92.534 kg)  BMI 26.18 kg/m2  SpO2 100%  Physical Exam  Constitutional: He is oriented to person, place, and time and well-developed, well-nourished, and in no distress. No distress.  Very pale in appearance  HENT:  Head: Normocephalic and atraumatic.  Eyes: Pupils are equal, round, and reactive to light. Right eye exhibits no discharge. Left eye exhibits no discharge. No scleral icterus.  pallor  Neck: Normal range of motion.  Cardiovascular: Normal rate and regular rhythm.   Pulmonary/Chest: Effort normal and breath sounds normal. No respiratory distress. He has no wheezes. He has no rales.  Abdominal: Soft. He exhibits no distension. There is no tenderness. There is no rebound and no guarding.  Musculoskeletal: Normal range of motion. He  exhibits no edema.  Lymphadenopathy:    He has no cervical adenopathy.  Neurological: He is alert and oriented to person, place, and time.  Skin: Skin is warm and dry. No rash noted. He is not diaphoretic. No erythema.  Psychiatric: Mood and affect normal.  Vitals reviewed.     Results for orders placed or performed during the hospital encounter of 06/13/15 (from the past 48 hour(s))  Lactic acid, plasma     Status: Abnormal   Collection Time: 06/13/15  5:57 PM  Result Value Ref Range   Lactic Acid, Venous 2.8 (HH) 0.5 - 2.0 mmol/L    Comment: CRITICAL RESULT CALLED TO, READ BACK BY AND VERIFIED WITH STEVE SNIDER RN AT 1855 06/13/15 MSS.   Comprehensive metabolic panel     Status: Abnormal   Collection Time: 06/13/15  5:57 PM  Result Value Ref Range   Sodium 134 (L) 135 - 145 mmol/L   Potassium 4.8 3.5 - 5.1 mmol/L   Chloride 99 (L) 101 - 111 mmol/L   CO2 22 22 - 32 mmol/L   Glucose, Bld 268 (H) 65 - 99 mg/dL   BUN 28 (H) 6 - 20 mg/dL   Creatinine, Ser 2.26 (H) 0.61 - 1.24 mg/dL   Calcium 8.7 (L) 8.9 - 10.3 mg/dL   Total Protein 7.2 6.5 - 8.1 g/dL   Albumin 3.1 (L) 3.5 - 5.0 g/dL   AST 284 (H) 15 - 41 U/L   ALT 493 (H) 17 - 63 U/L   Alkaline Phosphatase 371 (H) 38 - 126 U/L   Total Bilirubin 1.6 (H) 0.3 - 1.2 mg/dL   GFR calc non Af Amer 28 (L) >60 mL/min   GFR calc Af Amer 32 (L) >60 mL/min    Comment: (NOTE) The eGFR has been calculated using the CKD EPI equation. This calculation has not been validated in all clinical situations. eGFR's persistently <60 mL/min signify possible Chronic Kidney Disease.    Anion gap 13 5 - 15  Lipase, blood     Status: None   Collection Time: 06/13/15  5:57 PM  Result Value Ref Range   Lipase 15 11 - 51 U/L  Troponin I     Status: None   Collection Time: 06/13/15  5:57 PM  Result Value Ref Range   Troponin I <0.03 <0.031 ng/mL    Comment:        NO INDICATION OF MYOCARDIAL INJURY.   CBC WITH DIFFERENTIAL     Status: Abnormal    Collection Time: 06/13/15  5:57 PM  Result Value Ref Range   WBC 8.8 3.8 - 10.6 K/uL   RBC 4.29 (L) 4.40 - 5.90 MIL/uL   Hemoglobin 11.4 (L) 13.0 - 18.0 g/dL   HCT 34.3 (L) 40.0 - 52.0 %   MCV 80.0 80.0 - 100.0 fL   MCH 26.5 26.0 - 34.0 pg   MCHC 33.2 32.0 - 36.0 g/dL   RDW 15.9 (H) 11.5 - 14.5 %  Platelets 321 150 - 440 K/uL   Neutrophils Relative % 83 %   Neutro Abs 7.3 (H) 1.4 - 6.5 K/uL   Lymphocytes Relative 10 %   Lymphs Abs 0.8 (L) 1.0 - 3.6 K/uL   Monocytes Relative 5 %   Monocytes Absolute 0.4 0.2 - 1.0 K/uL   Eosinophils Relative 2 %   Eosinophils Absolute 0.1 0 - 0.7 K/uL   Basophils Relative 0 %   Basophils Absolute 0.0 0 - 0.1 K/uL  APTT     Status: Abnormal   Collection Time: 06/13/15  5:57 PM  Result Value Ref Range   aPTT 40 (H) 24 - 36 seconds    Comment:        IF BASELINE aPTT IS ELEVATED, SUGGEST PATIENT RISK ASSESSMENT BE USED TO DETERMINE APPROPRIATE ANTICOAGULANT THERAPY.   Protime-INR     Status: Abnormal   Collection Time: 06/13/15  5:57 PM  Result Value Ref Range   Prothrombin Time 18.8 (H) 11.4 - 15.0 seconds   INR 1.57   Blood Culture (routine x 2)     Status: None (Preliminary result)   Collection Time: 06/13/15  5:57 PM  Result Value Ref Range   Specimen Description BLOOD RIGHT PICC LINE    Special Requests BOTTLES DRAWN AEROBIC AND ANAEROBIC 8CC    Culture NO GROWTH < 24 HOURS    Report Status PENDING   Blood Culture (routine x 2)     Status: None (Preliminary result)   Collection Time: 06/13/15  5:58 PM  Result Value Ref Range   Specimen Description BLOOD LEFT FATTY CASTS    Special Requests BOTTLES DRAWN AEROBIC AND ANAEROBIC  3CC    Culture NO GROWTH < 24 HOURS    Report Status PENDING   Lactic acid, plasma     Status: None   Collection Time: 06/13/15 10:55 PM  Result Value Ref Range   Lactic Acid, Venous 1.0 0.5 - 2.0 mmol/L  Glucose, capillary     Status: Abnormal   Collection Time: 06/13/15 11:03 PM  Result Value Ref  Range   Glucose-Capillary 155 (H) 65 - 99 mg/dL   Comment 1 Notify RN   Urinalysis complete, with microscopic ([facility] only)     Status: Abnormal   Collection Time: 06/14/15  2:34 AM  Result Value Ref Range   Color, Urine YELLOW (A) YELLOW   APPearance HAZY (A) CLEAR   Glucose, UA >500 (A) NEGATIVE mg/dL   Bilirubin Urine NEGATIVE NEGATIVE   Ketones, ur NEGATIVE NEGATIVE mg/dL   Specific Gravity, Urine 1.015 1.005 - 1.030   Hgb urine dipstick 1+ (A) NEGATIVE   pH 5.0 5.0 - 8.0   Protein, ur 30 (A) NEGATIVE mg/dL   Nitrite NEGATIVE NEGATIVE   Leukocytes, UA NEGATIVE NEGATIVE   RBC / HPF 0-5 0 - 5 RBC/hpf   WBC, UA 0-5 0 - 5 WBC/hpf   Bacteria, UA RARE (A) NONE SEEN   Squamous Epithelial / LPF NONE SEEN NONE SEEN   Mucous PRESENT   Urine culture     Status: None (Preliminary result)   Collection Time: 06/14/15  2:34 AM  Result Value Ref Range   Specimen Description URINE, RANDOM    Special Requests NONE    Culture NO GROWTH < 12 HOURS    Report Status PENDING   CBC     Status: Abnormal   Collection Time: 06/14/15  6:13 AM  Result Value Ref Range   WBC 6.6 3.8 - 10.6 K/uL  RBC 3.48 (L) 4.40 - 5.90 MIL/uL   Hemoglobin 9.2 (L) 13.0 - 18.0 g/dL   HCT 27.7 (L) 40.0 - 52.0 %   MCV 79.5 (L) 80.0 - 100.0 fL   MCH 26.4 26.0 - 34.0 pg   MCHC 33.1 32.0 - 36.0 g/dL   RDW 15.7 (H) 11.5 - 14.5 %   Platelets 261 150 - 440 K/uL  Comprehensive metabolic panel     Status: Abnormal   Collection Time: 06/14/15  6:13 AM  Result Value Ref Range   Sodium 135 135 - 145 mmol/L    Comment: ELECTROLYTES REPETED   Potassium 4.5 3.5 - 5.1 mmol/L   Chloride 109 101 - 111 mmol/L   CO2 25 22 - 32 mmol/L   Glucose, Bld 248 (H) 65 - 99 mg/dL   BUN 26 (H) 6 - 20 mg/dL   Creatinine, Ser 1.65 (H) 0.61 - 1.24 mg/dL   Calcium 7.6 (L) 8.9 - 10.3 mg/dL   Total Protein 5.4 (L) 6.5 - 8.1 g/dL   Albumin 2.2 (L) 3.5 - 5.0 g/dL   AST 125 (H) 15 - 41 U/L   ALT 285 (H) 17 - 63 U/L   Alkaline Phosphatase 260 (H)  38 - 126 U/L   Total Bilirubin 0.7 0.3 - 1.2 mg/dL   GFR calc non Af Amer 41 (L) >60 mL/min   GFR calc Af Amer 47 (L) >60 mL/min    Comment: (NOTE) The eGFR has been calculated using the CKD EPI equation. This calculation has not been validated in all clinical situations. eGFR's persistently <60 mL/min signify possible Chronic Kidney Disease.    Anion gap 1 (L) 5 - 15  Glucose, capillary     Status: Abnormal   Collection Time: 06/14/15  7:52 AM  Result Value Ref Range   Glucose-Capillary 183 (H) 65 - 99 mg/dL  Glucose, capillary     Status: Abnormal   Collection Time: 06/14/15 11:56 AM  Result Value Ref Range   Glucose-Capillary 247 (H) 65 - 99 mg/dL  Glucose, capillary     Status: Abnormal   Collection Time: 06/14/15  4:51 PM  Result Value Ref Range   Glucose-Capillary 220 (H) 65 - 99 mg/dL   Dg Chest Port 1 View  06/13/2015  CLINICAL DATA:  Abnormal laboratory values. Possible liver or gallbladder abnormality. Initial encounter. EXAM: PORTABLE CHEST 1 VIEW COMPARISON:  Single-view of the chest 05/16/2015. FINDINGS: Right PICC is in place with the tip near the superior cavoatrial junction. Lungs are clear. No pneumothorax or pleural effusion. Heart size normal per IMPRESSION: No acute disease. Electronically Signed   By: Inge Rise M.D.   On: 06/13/2015 18:10   US Abdomen Limited Ruq  06/13/2015  CLINICAL DATA:  Elevated liver function studies, right upper quadrant abdominal pain and postprandial nausea for 07-10 days. EXAM: US ABDOMEN LIMITED - RIGHT UPPER QUADRANT COMPARISON:  12/11/2008 FINDINGS: Gallbladder: Large shadowing gallstones in the gallbladder. The largest measures approximately 4.0 x 3.0 x 0.6 cm. No gallbladder wall thickening or pericholecystic fluid. Negative sonographic Murphy sign. Common bile duct: Diameter: 5.8 mm Liver: Normal echogenicity without focal lesion or biliary dilatation. IMPRESSION: Large shadowing gallstones in the gallbladder but no sonographic  findings for acute cholecystitis. Next item normal Normal Caliber common bile duct and normal liver. Electronically Signed   By: Marijo Sanes M.D.   On: 06/13/2015 18:52    Assessment/Plan:  This patient being treated for strep discitis at this point on IV antibiotics is  being evaluated for elevated liver function tests with known gallstones. Patient describes absolutely no upper quadrant pain he does have occasional postprandial lower quadrant infraumbilical pain which would not be consistent with gallbladder disease. He currently also has multiple medical problems including diabetes and is anticoagulated for recent DVT PE making him a very serious candidate as far as risk for laparoscopic cholecystectomy. It may not even be that laparoscopy could be performed because of the rather large complex scar in the right upper quadrant from prior pyloromyotomy. With that in mind and the fact that these liver function tests may be related to medications or his other conditions including sepsis I see no indication that laparoscopic cholecystectomy or cholecystectomy via any means is necessary at this point I will be happy to follow the patient and if things should change or that more evidence suggests that this is gallbladder related intervention could be discussed at that time.  Florene Glen, MD, FACS

## 2015-06-14 NOTE — Consult Note (Signed)
[facility] Infectious Disease     Reason for Consult Elevated LFTs, discitis    Referring Physician: Leslye Peer Date of Admission:  06/13/2015   Active Problems:   Acute renal failure (HCC)   Pressure ulcer   HPI: Eric Glover is a 70 y.o. 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  He was advised to go to ED given labs done at Home by [facility] with markedly elevated LFTs with AST 500, ALT 585 alk phos 415 and T bili 2.5. Per my discussion with his wife yesterday he was having abd pain, nausea and poor po intake.  In Ed he was noted to be hypotensive and tachycardic but this responded to fluid bolus.  His Cr was 1.29 as otpt labs but 2.26 on admission. His back pain has markedly improved since being on IV abx but still persists.  CTx has been held, RUQ USS shows large gallstones but no cholecystitis.  He has been started on vancomycin. WBC was 8 and he has been afebrile. There was some concern for picc line infection but bcx negative. UA neg and UCX was done and neg. LFTs are trending down.  Past Medical History  Diagnosis Date  . Diabetes mellitus without complication ([place])   . GERD (gastroesophageal reflux disease)   . Hyperlipidemia   . Hypertension   . CKD (chronic kidney disease), stage III   . Gout   . Staph infection 2017  . Discitis of lumbosacral region   . Discitis of lumbosacral region   . Pulmonary embolism ([place])   . Atrial fibrillation [facility])    Past Surgical History  Procedure Laterality Date  . Shoulder arthroscopy w/ rotator cuff repair    . Tee without cardioversion N/A 05/15/2015    Procedure: TRANSESOPHAGEAL ECHOCARDIOGRAM (TEE);  Surgeon: Teodoro Spray, MD;  Location: [facility] ORS;  Service: Cardiovascular;  Laterality: N/A;   Social History  Substance Use Topics  . Smoking status: Never Smoker   . Smokeless tobacco: None  . Alcohol Use: No   Family History   Problem Relation Age of Onset  . Alcohol abuse Paternal Uncle   . Early death Paternal Uncle   . Arthritis Mother   . Cancer Mother   . Diabetes Mother   . Hearing loss Mother   . Heart disease Mother   . Hyperlipidemia Mother   . Hypertension Mother   . Kidney disease Mother   . Diabetes Father   . Hearing loss Father   . Congestive Heart Failure Father     Allergies:  Allergies  Allergen Reactions  . Neomycin-Bacitracin Zn-Polymyx Rash    Current antibiotics: Antibiotics Given (last 72 hours)    None      MEDICATIONS: . allopurinol  100 mg Oral Daily  . apixaban  5 mg Oral BID  . aspirin EC  81 mg Oral QHS  . budesonide (PULMICORT) nebulizer solution  0.25 mg Nebulization BID  . cyanocobalamin  500 mcg Oral Daily  . fentaNYL  50 mcg Transdermal Q72H  . insulin aspart  0-5 Units Subcutaneous QHS  . insulin aspart  0-9 Units Subcutaneous TID WC  . insulin detemir  40 Units Subcutaneous QHS  . lidocaine  1 patch Transdermal Q24H  . metoprolol tartrate  25 mg Oral BID  . pantoprazole  40 mg Oral Daily    Review of Systems - 11 systems reviewed and  negative per HPI   OBJECTIVE: Temp:  [97.8 F (36.6 C)-99.4 F (37.4 C)] 98.7 F (37.1 C) (03/22 1159) Pulse Rate:  [56-138] 56 (03/22 1159) Resp:  [11-33] 18 (03/22 1159) BP: (59-151)/(38-88) 142/49 mmHg (03/22 1159) SpO2:  [91 %-99 %] 96 % (03/22 1159) Weight:  [91.173 kg (201 lb)-92.534 kg (204 lb)] 92.534 kg (204 lb) (03/21 1729) Physical Exam  Constitutional: He is oriented to person, place, and time. He appears well-developed and well-nourished. No distress.  obese HENT: anicteric,  Mouth/Throat: Oropharynx is clear and dry. No oropharyngeal exudate.  Cardiovascular: Normal rate, regular rhythm and normal heart sounds.  Pulmonary/Chest: Effort normal and breath sounds normal. No respiratory distress. He has no wheezes.  Abdominal: Soft. Bowel sounds are normal. He exhibits no distension. There is no  tenderness.  Lymphadenopathy: He has no cervical adenopathy.  Neurological: He is alert and oriented to person, place, and time.  Skin: Skin is warm and dry. No rash noted. No erythema.  Psychiatric: He has a normal mood and affect. His behavior is normal.  PICC RUE    LABS: Results for orders placed or performed during the hospital encounter of 06/13/15 (from the past 48 hour(s))  Lactic acid, plasma     Status: Abnormal   Collection Time: 06/13/15  5:57 PM  Result Value Ref Range   Lactic Acid, Venous 2.8 (HH) 0.5 - 2.0 mmol/L    Comment: CRITICAL RESULT CALLED TO, READ BACK BY AND VERIFIED WITH STEVE SNIDER RN AT 1855 06/13/15 MSS.   Comprehensive metabolic panel     Status: Abnormal   Collection Time: 06/13/15  5:57 PM  Result Value Ref Range   Sodium 134 (L) 135 - 145 mmol/L   Potassium 4.8 3.5 - 5.1 mmol/L   Chloride 99 (L) 101 - 111 mmol/L   CO2 22 22 - 32 mmol/L   Glucose, Bld 268 (H) 65 - 99 mg/dL   BUN 28 (H) 6 - 20 mg/dL   Creatinine, Ser 2.26 (H) 0.61 - 1.24 mg/dL   Calcium 8.7 (L) 8.9 - 10.3 mg/dL   Total Protein 7.2 6.5 - 8.1 g/dL   Albumin 3.1 (L) 3.5 - 5.0 g/dL   AST 284 (H) 15 - 41 U/L   ALT 493 (H) 17 - 63 U/L   Alkaline Phosphatase 371 (H) 38 - 126 U/L   Total Bilirubin 1.6 (H) 0.3 - 1.2 mg/dL   GFR calc non Af Amer 28 (L) >60 mL/min   GFR calc Af Amer 32 (L) >60 mL/min    Comment: (NOTE) The eGFR has been calculated using the CKD EPI equation. This calculation has not been validated in all clinical situations. eGFR's persistently <60 mL/min signify possible Chronic Kidney Disease.    Anion gap 13 5 - 15  Lipase, blood     Status: None   Collection Time: 06/13/15  5:57 PM  Result Value Ref Range   Lipase 15 11 - 51 U/L  Troponin I     Status: None   Collection Time: 06/13/15  5:57 PM  Result Value Ref Range   Troponin I <0.03 <0.031 ng/mL    Comment:        NO INDICATION OF MYOCARDIAL INJURY.   CBC WITH DIFFERENTIAL     Status: Abnormal    Collection Time: 06/13/15  5:57 PM  Result Value Ref Range   WBC 8.8 3.8 - 10.6 K/uL   RBC 4.29 (L) 4.40 - 5.90 MIL/uL   Hemoglobin 11.4 (L) 13.0 -  18.0 g/dL   HCT 34.3 (L) 40.0 - 52.0 %   MCV 80.0 80.0 - 100.0 fL   MCH 26.5 26.0 - 34.0 pg   MCHC 33.2 32.0 - 36.0 g/dL   RDW 15.9 (H) 11.5 - 14.5 %   Platelets 321 150 - 440 K/uL   Neutrophils Relative % 83 %   Neutro Abs 7.3 (H) 1.4 - 6.5 K/uL   Lymphocytes Relative 10 %   Lymphs Abs 0.8 (L) 1.0 - 3.6 K/uL   Monocytes Relative 5 %   Monocytes Absolute 0.4 0.2 - 1.0 K/uL   Eosinophils Relative 2 %   Eosinophils Absolute 0.1 0 - 0.7 K/uL   Basophils Relative 0 %   Basophils Absolute 0.0 0 - 0.1 K/uL  APTT     Status: Abnormal   Collection Time: 06/13/15  5:57 PM  Result Value Ref Range   aPTT 40 (H) 24 - 36 seconds    Comment:        IF BASELINE aPTT IS ELEVATED, SUGGEST PATIENT RISK ASSESSMENT BE USED TO DETERMINE APPROPRIATE ANTICOAGULANT THERAPY.   Protime-INR     Status: Abnormal   Collection Time: 06/13/15  5:57 PM  Result Value Ref Range   Prothrombin Time 18.8 (H) 11.4 - 15.0 seconds   INR 1.57   Blood Culture (routine x 2)     Status: None (Preliminary result)   Collection Time: 06/13/15  5:57 PM  Result Value Ref Range   Specimen Description BLOOD RIGHT PICC LINE    Special Requests BOTTLES DRAWN AEROBIC AND ANAEROBIC 8CC    Culture NO GROWTH < 24 HOURS    Report Status PENDING   Blood Culture (routine x 2)     Status: None (Preliminary result)   Collection Time: 06/13/15  5:58 PM  Result Value Ref Range   Specimen Description BLOOD LEFT FATTY CASTS    Special Requests BOTTLES DRAWN AEROBIC AND ANAEROBIC  3CC    Culture NO GROWTH < 24 HOURS    Report Status PENDING   Lactic acid, plasma     Status: None   Collection Time: 06/13/15 10:55 PM  Result Value Ref Range   Lactic Acid, Venous 1.0 0.5 - 2.0 mmol/L  Glucose, capillary     Status: Abnormal   Collection Time: 06/13/15 11:03 PM  Result Value Ref Range    Glucose-Capillary 155 (H) 65 - 99 mg/dL   Comment 1 Notify RN   Urinalysis complete, with microscopic ([facility] only)     Status: Abnormal   Collection Time: 06/14/15  2:34 AM  Result Value Ref Range   Color, Urine YELLOW (A) YELLOW   APPearance HAZY (A) CLEAR   Glucose, UA >500 (A) NEGATIVE mg/dL   Bilirubin Urine NEGATIVE NEGATIVE   Ketones, ur NEGATIVE NEGATIVE mg/dL   Specific Gravity, Urine 1.015 1.005 - 1.030   Hgb urine dipstick 1+ (A) NEGATIVE   pH 5.0 5.0 - 8.0   Protein, ur 30 (A) NEGATIVE mg/dL   Nitrite NEGATIVE NEGATIVE   Leukocytes, UA NEGATIVE NEGATIVE   RBC / HPF 0-5 0 - 5 RBC/hpf   WBC, UA 0-5 0 - 5 WBC/hpf   Bacteria, UA RARE (A) NONE SEEN   Squamous Epithelial / LPF NONE SEEN NONE SEEN   Mucous PRESENT   Urine culture     Status: None (Preliminary result)   Collection Time: 06/14/15  2:34 AM  Result Value Ref Range   Specimen Description URINE, RANDOM    Special Requests NONE  Culture NO GROWTH < 12 HOURS    Report Status PENDING   CBC     Status: Abnormal   Collection Time: 06/14/15  6:13 AM  Result Value Ref Range   WBC 6.6 3.8 - 10.6 K/uL   RBC 3.48 (L) 4.40 - 5.90 MIL/uL   Hemoglobin 9.2 (L) 13.0 - 18.0 g/dL   HCT 27.7 (L) 40.0 - 52.0 %   MCV 79.5 (L) 80.0 - 100.0 fL   MCH 26.4 26.0 - 34.0 pg   MCHC 33.1 32.0 - 36.0 g/dL   RDW 15.7 (H) 11.5 - 14.5 %   Platelets 261 150 - 440 K/uL  Comprehensive metabolic panel     Status: Abnormal   Collection Time: 06/14/15  6:13 AM  Result Value Ref Range   Sodium 135 135 - 145 mmol/L    Comment: ELECTROLYTES REPETED   Potassium 4.5 3.5 - 5.1 mmol/L   Chloride 109 101 - 111 mmol/L   CO2 25 22 - 32 mmol/L   Glucose, Bld 248 (H) 65 - 99 mg/dL   BUN 26 (H) 6 - 20 mg/dL   Creatinine, Ser 1.65 (H) 0.61 - 1.24 mg/dL   Calcium 7.6 (L) 8.9 - 10.3 mg/dL   Total Protein 5.4 (L) 6.5 - 8.1 g/dL   Albumin 2.2 (L) 3.5 - 5.0 g/dL   AST 125 (H) 15 - 41 U/L   ALT 285 (H) 17 - 63 U/L   Alkaline Phosphatase 260 (H) 38 -  126 U/L   Total Bilirubin 0.7 0.3 - 1.2 mg/dL   GFR calc non Af Amer 41 (L) >60 mL/min   GFR calc Af Amer 47 (L) >60 mL/min    Comment: (NOTE) The eGFR has been calculated using the CKD EPI equation. This calculation has not been validated in all clinical situations. eGFR's persistently <60 mL/min signify possible Chronic Kidney Disease.    Anion gap 1 (L) 5 - 15  Glucose, capillary     Status: Abnormal   Collection Time: 06/14/15  7:52 AM  Result Value Ref Range   Glucose-Capillary 183 (H) 65 - 99 mg/dL  Glucose, capillary     Status: Abnormal   Collection Time: 06/14/15 11:56 AM  Result Value Ref Range   Glucose-Capillary 247 (H) 65 - 99 mg/dL   No components found for: ESR, C REACTIVE PROTEIN MICRO: Recent Results (from the past 720 hour(s))  Blood Culture (routine x 2)     Status: None (Preliminary result)   Collection Time: 06/13/15  5:57 PM  Result Value Ref Range Status   Specimen Description BLOOD RIGHT PICC LINE  Final   Special Requests BOTTLES DRAWN AEROBIC AND ANAEROBIC 8CC  Final   Culture NO GROWTH < 24 HOURS  Final   Report Status PENDING  Incomplete  Blood Culture (routine x 2)     Status: None (Preliminary result)   Collection Time: 06/13/15  5:58 PM  Result Value Ref Range Status   Specimen Description BLOOD LEFT FATTY CASTS  Final   Special Requests BOTTLES DRAWN AEROBIC AND ANAEROBIC  3CC  Final   Culture NO GROWTH < 24 HOURS  Final   Report Status PENDING  Incomplete  Urine culture     Status: None (Preliminary result)   Collection Time: 06/14/15  2:34 AM  Result Value Ref Range Status   Specimen Description URINE, RANDOM  Final   Special Requests NONE  Final   Culture NO GROWTH < 12 HOURS  Final   Report Status  PENDING  Incomplete    IMAGING: Dg Lumbar Spine 2-3 Views  06/07/2015  CLINICAL DATA:  Low back pain for 4 years. EXAM: LUMBAR SPINE - 2-3 VIEW COMPARISON:  Lumbar MRI 05/24/2015 FINDINGS: There are 5 nonrib bearing lumbar-type vertebral  bodies. The vertebral body heights are maintained. There is grade 1 anterolisthesis of L5 on S1. There is 3 mm of retrolisthesis of L3 on L4. There is no acute fracture. There is degenerative disc disease and facet arthropathy at L3-4, L4-5 and L5-S1. The SI joints are unremarkable. There is abdominal aortic atherosclerosis. IMPRESSION: No acute osseous injury of the lumbar spine. Electronically Signed   By: Kathreen Devoid   On: 06/07/2015 13:05   Mr Brain W Wo Contrast  05/16/2015  CLINICAL DATA:  Acute encephalopathy.  Mental status change. EXAM: MRI HEAD WITHOUT AND WITH CONTRAST TECHNIQUE: Multiplanar, multiecho pulse sequences of the brain and surrounding structures were obtained without and with intravenous contrast. CONTRAST:  57m MULTIHANCE GADOBENATE DIMEGLUMINE 529 MG/ML IV SOLN COMPARISON:  None. FINDINGS: Cerebral volume and ventricles normal for age. Negative for hydrocephalus Negative for acute infarct. Scattered small white matter hyperintensities bilaterally consistent with chronic microvascular ischemia. Basal ganglia and brainstem intact. Negative for intracranial hemorrhage. Negative for mass or edema. No shift of the midline structures. Pituitary not enlarged.  Normal skullbase. Postcontrast imaging reveals normal enhancement. No enhancing mass lesion. Vascular enhancement is normal. Paranasal sinuses clear.  Normal orbital structures. IMPRESSION: No acute intracranial abnormality. Mild chronic microvascular ischemia. Electronically Signed   By: CFranchot GalloM.D.   On: 05/16/2015 10:24   Mr Lumbar Spine Wo Contrast  05/24/2015  CLINICAL DATA:  Bacteremia. Right-sided back pain. Being treated with antibiotics. EXAM: MRI LUMBAR SPINE WITHOUT CONTRAST TECHNIQUE: Multiplanar, multisequence MR imaging of the lumbar spine was performed. No intravenous contrast was administered. COMPARISON:  05/10/2015 FINDINGS: Since the previous study, the patient has developed changes at the L2-3 and L5-S1  levels likely to indicate infectious discitis and early vertebral body osteomyelitis. At those levels, increased water signal is present in the disc space. Increased marrow edema is present within the L2, L3, L5 and S1 vertebral bodies. There is some extension of phlegmonous material into the intervertebral foramen on the left at L2-3. There is some abnormal material in the ventral epidural space behind L5 the could be inflammatory material. Chronic degenerative changes at the other levels are unchanged. IMPRESSION: Since the previous study, changes at L2-3 and L5-S1 are strongly suggestive of infectious discitis and vertebral body osteomyelitis. Early phlegmonous inflammation extending to the left at the L2-3 level extending into the foramen and the paraspinous soft tissues. Probable early phlegmonous inflammation in the ventral epidural space behind L5. Electronically Signed   By: MNelson ChimesM.D.   On: 05/24/2015 14:06   Dg Chest Port 1 View  06/13/2015  CLINICAL DATA:  Abnormal laboratory values. Possible liver or gallbladder abnormality. Initial encounter. EXAM: PORTABLE CHEST 1 VIEW COMPARISON:  Single-view of the chest 05/16/2015. FINDINGS: Right PICC is in place with the tip near the superior cavoatrial junction. Lungs are clear. No pneumothorax or pleural effusion. Heart size normal per IMPRESSION: No acute disease. Electronically Signed   By: TInge RiseM.D.   On: 06/13/2015 18:10   Dg Chest Port 1 View  05/16/2015  CLINICAL DATA:  PICC line placement EXAM: PORTABLE CHEST 1 VIEW COMPARISON:  05/16/2015 FINDINGS: The heart size and vascular pattern are normal. Mild bibasilar atelectasis. Right PICC line has been placed with  tip 5 cm above the cavoatrial junction. No pneumothorax. IMPRESSION: PICC line as described Electronically Signed   By: Skipper Cliche M.D.   On: 05/16/2015 16:05   Dg Chest Port 1 View  05/16/2015  CLINICAL DATA:  PICC EXAM: PORTABLE CHEST 1 VIEW COMPARISON:  Earlier  today FINDINGS: Right upper extremity PICC traverses into the right jugular vein. The tip projects beyond the upper limit of the study. Lungs under aerated with basilar atelectasis. Normal heart size. IMPRESSION: Right upper extremity PICC now traverses into the right jugular vein. Electronically Signed   By: Marybelle Killings M.D.   On: 05/16/2015 16:02   Dg Chest Port 1 View  05/16/2015  CLINICAL DATA:  PICC placed EXAM: PORTABLE CHEST 1 VIEW COMPARISON:  05/13/2015 FINDINGS: Right upper extremity PICC placed. It is coiled in IJ but traverses into the right innominate vein. The tip is in the superior SVC. Normal heart size.  Lungs under aerated with basilar atelectasis. IMPRESSION: Right upper extremity PICC with its tip at the upper SVC. Bibasilar atelectasis. Electronically Signed   By: Marybelle Killings M.D.   On: 05/16/2015 14:46   US Abdomen Limited Ruq  06/13/2015  CLINICAL DATA:  Elevated liver function studies, right upper quadrant abdominal pain and postprandial nausea for 07-10 days. EXAM: US ABDOMEN LIMITED - RIGHT UPPER QUADRANT COMPARISON:  12/11/2008 FINDINGS: Gallbladder: Large shadowing gallstones in the gallbladder. The largest measures approximately 4.0 x 3.0 x 0.6 cm. No gallbladder wall thickening or pericholecystic fluid. Negative sonographic Murphy sign. Common bile duct: Diameter: 5.8 mm Liver: Normal echogenicity without focal lesion or biliary dilatation. IMPRESSION: Large shadowing gallstones in the gallbladder but no sonographic findings for acute cholecystitis. Next item normal Normal Caliber common bile duct and normal liver. Electronically Signed   By: Marijo Sanes M.D.   On: 06/13/2015 18:52    Assessment:   Eric Glover is a 70 y.o. male with Strep mutans bacteremia 2/13 and lumbar sacral discitis who has been on IV ceftraixone but now admitted with abd pain, nausea, dehydration and elevated LFTS. USS shows large Gallstones but no current obstruction.  I suspect he had  biliary colic given the large gallstones, the abd sxs and the fact that he is clincially improving  despite only missing one dose of ceftriaxone. However CTX can certainly cause biliary sludging and increased LFTS.  He does report having a similar episode several years ago was seen in Ed and told needed surgery and was told had gallstones but then saw a surgeon in [facility] and told did not need surgery  Recommendations Continue vancomycin in place of ceftriaxone Will need to continue this until 4/4  then can switch to oral levofloxacin 750 qd for 2 more weeks.  I can see in fu at time of change to orals Will consult surgery to get their opinion if this could be biliary colic  Thank you very much for allowing me to participate in the care of this patient. Please call with questions.   Cheral Marker. Ola Spurr, MD

## 2015-06-14 NOTE — Progress Notes (Signed)
[place] at [place] NAME: Eric Glover    MR#:  [id number]  DATE OF BIRTH:  08-02-1945  SUBJECTIVE:  CHIEF COMPLAINT:   Chief Complaint  Patient presents with  . Abnormal Lab    Was sent from ID clinic as LFTs were high. He is on IV rocephin for 1 month for Discitis. Had poor appetite and was dehydrated , when came.  REVIEW OF SYSTEMS:  CONSTITUTIONAL: No fever, positive fatigue or weakness.  EYES: No blurred or double vision.  EARS, NOSE, AND THROAT: No tinnitus or ear pain.  RESPIRATORY: No cough, shortness of breath, wheezing or hemoptysis.  CARDIOVASCULAR: No chest pain, orthopnea, edema.  GASTROINTESTINAL: No nausea, vomiting, diarrhea , some abdominal pain.  GENITOURINARY: No dysuria, hematuria.  ENDOCRINE: No polyuria, nocturia,  HEMATOLOGY: No anemia, easy bruising or bleeding SKIN: No rash or lesion. MUSCULOSKELETAL: No joint pain or arthritis.   NEUROLOGIC: No tingling, numbness, weakness.  PSYCHIATRY: No anxiety or depression.   ROS  DRUG ALLERGIES:   Allergies  Allergen Reactions  . Neomycin-Bacitracin Zn-Polymyx Rash    VITALS:  Blood pressure 129/47, pulse 64, temperature 98.2 F (36.8 C), temperature source Oral, resp. rate 18, height 6\' 2"  (1.88 m), weight 92.534 kg (204 lb), SpO2 95 %.  PHYSICAL EXAMINATION:  GENERAL:  70 y.o.-year-old patient lying in the bed with no acute distress.  EYES: Pupils equal, round, reactive to light and accommodation. No scleral icterus. Extraocular muscles intact.  HEENT: Head atraumatic, normocephalic. Oropharynx and nasopharynx clear.  NECK:  Supple, no jugular venous distention. No thyroid enlargement, no tenderness.  LUNGS: Normal breath sounds bilaterally, no wheezing, rales,rhonchi or crepitation. No use of accessory muscles of respiration.  CARDIOVASCULAR: S1, S2 normal. No murmurs, rubs, or gallops.  ABDOMEN: Soft, nontender, nondistended. Bowel sounds  present. No organomegaly or mass.  EXTREMITIES: No pedal edema, cyanosis, or clubbing.  NEUROLOGIC: Cranial nerves II through XII are intact. Muscle strength 5/5 in all extremities. Sensation intact. Gait not checked.  PSYCHIATRIC: The patient is alert and oriented x 3.  SKIN: No obvious rash, lesion, or ulcer.   Physical Exam LABORATORY PANEL:   CBC  Recent Labs Lab 06/14/15 0613  WBC 6.6  HGB 9.2*  HCT 27.7*  PLT 261   ------------------------------------------------------------------------------------------------------------------  Chemistries   Recent Labs Lab 06/14/15 0613  NA 135  K 4.5  CL 109  CO2 25  GLUCOSE 248*  BUN 26*  CREATININE 1.65*  CALCIUM 7.6*  AST 125*  ALT 285*  ALKPHOS 260*  BILITOT 0.7   ------------------------------------------------------------------------------------------------------------------  Cardiac Enzymes  Recent Labs Lab 06/13/15 1757  TROPONINI <0.03   ------------------------------------------------------------------------------------------------------------------  RADIOLOGY:  Dg Chest Port 1 View  06/13/2015  CLINICAL DATA:  Abnormal laboratory values. Possible liver or gallbladder abnormality. Initial encounter. EXAM: PORTABLE CHEST 1 VIEW COMPARISON:  Single-view of the chest 05/16/2015. FINDINGS: Right PICC is in place with the tip near the superior cavoatrial junction. Lungs are clear. No pneumothorax or pleural effusion. Heart size normal per IMPRESSION: No acute disease. Electronically Signed   By: Inge Rise M.D.   On: 06/13/2015 18:10   US Abdomen Limited Ruq  06/13/2015  CLINICAL DATA:  Elevated liver function studies, right upper quadrant abdominal pain and postprandial nausea for 07-10 days. EXAM: US ABDOMEN LIMITED - RIGHT UPPER QUADRANT COMPARISON:  12/11/2008 FINDINGS: Gallbladder: Large shadowing gallstones in the gallbladder. The largest measures approximately 4.0 x 3.0 x 0.6 cm. No gallbladder wall  thickening  or pericholecystic fluid. Negative sonographic Murphy sign. Common bile duct: Diameter: 5.8 mm Liver: Normal echogenicity without focal lesion or biliary dilatation. IMPRESSION: Large shadowing gallstones in the gallbladder but no sonographic findings for acute cholecystitis. Next item normal Normal Caliber common bile duct and normal liver. Electronically Signed   By: Marijo Sanes M.D.   On: 06/13/2015 18:52    ASSESSMENT AND PLAN:   Active Problems:   Acute renal failure (HCC)   Pressure ulcer   Acute liver failure   Cholelithiasis without cholecystitis   Elevated LFTs  1. Acute renal failure on chronic kidney disease stage III. IV fluid hydration and continue to monitor. Hold nephrotoxic medication. Slight improved. 2. Increased liver function test. Hold Rocephin. Hold amiodarone. Hold Zocor. Recheck liver function test tomorrow morning, coming down. Likely this is medication related. 3. Streptococcus mutans discitis. ER physician gave a dose of vancomycin Appreciated infectious disease doctor Dr. Ola Spurr - suggested cont vanc.  4. History of pulmonary embolism on eliquis 5. Type 2 diabetes mellitus continue usual medications and sliding scale 6. History of Atrial fibrillation. holding amiodarone at this point. Continue metoprolol. Obtain EKG. 7. Hypotension. Give IV fluid hydration. Hold Cardura. Try to continue metoprolol.   All the records are reviewed and case discussed with Care Management/Social Workerr. Management plans discussed with the patient, family and they are in agreement.  CODE STATUS: Full  TOTAL TIME TAKING CARE OF THIS PATIENT: 35 minutes.     POSSIBLE D/C IN 2-3 DAYS, DEPENDING ON CLINICAL CONDITION.   Vaughan Basta M.D on 06/14/2015   Between 7am to 6pm - Pager - [phone]  After 6pm go to [email or web address] - password [email or web address] [facility]  Office  [phone]  CC: Primary care physician; Ashok Norris,  MD  Note: This dictation was prepared with Dragon dictation along with smaller phrase technology. Any transcriptional errors that result from this process are unintentional.

## 2015-06-15 LAB — URINE CULTURE: Culture: NO GROWTH

## 2015-06-15 LAB — GLUCOSE, CAPILLARY
GLUCOSE-CAPILLARY: 65 mg/dL (ref 65–99)
GLUCOSE-CAPILLARY: 262 mg/dL — AB (ref 65–99)
Glucose-Capillary: 172 mg/dL — ABNORMAL HIGH (ref 65–99)
Glucose-Capillary: 263 mg/dL — ABNORMAL HIGH (ref 65–99)

## 2015-06-15 LAB — COMPREHENSIVE METABOLIC PANEL
ALBUMIN: 2.3 g/dL — AB (ref 3.5–5.0)
ALT: 197 U/L — ABNORMAL HIGH (ref 17–63)
AST: 57 U/L — ABNORMAL HIGH (ref 15–41)
Alkaline Phosphatase: 202 U/L — ABNORMAL HIGH (ref 38–126)
Anion gap: 4 — ABNORMAL LOW (ref 5–15)
BILIRUBIN TOTAL: 0.5 mg/dL (ref 0.3–1.2)
BUN: 18 mg/dL (ref 6–20)
CALCIUM: 7.7 mg/dL — AB (ref 8.9–10.3)
CHLORIDE: 106 mmol/L (ref 101–111)
CO2: 24 mmol/L (ref 22–32)
CREATININE: 0.99 mg/dL (ref 0.61–1.24)
GFR calc Af Amer: >60 mL/min (ref >60)
GFR calc non Af Amer: >60 mL/min (ref >60)
Glucose, Bld: 97 mg/dL (ref 65–99)
Potassium: 3.8 mmol/L (ref 3.5–5.1)
SODIUM: 134 mmol/L — AB (ref 135–145)
Total Protein: 5.3 g/dL — ABNORMAL LOW (ref 6.5–8.1)

## 2015-06-15 LAB — C-REACTIVE PROTEIN: CRP: 2.1 mg/dL — AB (ref <1.0)

## 2015-06-15 MED ORDER — VANCOMYCIN HCL 10 G IV SOLR
1500.0000 mg | Freq: Two times a day (BID) | INTRAVENOUS | Status: DC
  Administered 2015-06-15 – 2015-06-16 (×3): 1500 mg via INTRAVENOUS
  Filled 2015-06-15 (×4): qty 1500

## 2015-06-15 NOTE — Progress Notes (Signed)
Pharmacy Antibiotic Note  Eric Glover is a 70 y.o. male admitted on 06/13/2015 with AKI and elevated LFTs. 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 Pharmacy is consulted to dose vancomycin.  Plan: Patient received vancomycin 2gm IV x 1 in ED on 3/21. Renal function much improved since admission.  Will order vancomycin 1500mg  IV Q12H without stack/load due to recent kidney injury.  Will check trough prior to 5th dose, which will be at steady state  Will closely monitor renal function and troughs as patient is at risk for accumulation of drug due to patient size.   Target trough: 15-34mcg/ml  Height: 6\' 2"  (188 cm) Weight: 204 lb (92.534 kg) IBW/kg (Calculated) : 82.2  Temp (24hrs), Avg:98.8 F (37.1 C), Min:98.1 F (36.7 C), Max:100.4 F (38 C)   Recent Labs Lab 06/13/15 1757 06/13/15 2255 06/14/15 0613 06/15/15 0535  WBC 8.8  --  6.6  --   CREATININE 2.26*  --  1.65* 0.99  LATICACIDVEN 2.8* 1.0  --   --     Estimated Creatinine Clearance: 81.9 mL/min (by C-G formula based on Cr of 0.99).    Allergies  Allergen Reactions  . Neomycin-Bacitracin Zn-Polymyx Rash    Antimicrobials this admission: Vancomycin 2gm x 1 3/21 @ 1900 3/23 vancomycin >>  (ceftriaxone 2gm Q24H PTA)  Dose adjustments this admission:   Microbiology results: 2/13 BCx: strep mutans x 2   3/21 BCx: NGTD x 2 3/21 UCx: NG final    Thank you for allowing pharmacy to be a part of this patient's care.  Rexene Edison, PharmD Clinical Pharmacist  06/15/2015 9:12 AM

## 2015-06-15 NOTE — Progress Notes (Signed)
[facility] [place] NAME: Eric Glover    MR#:  [id number]  DATE OF BIRTH:  1945/08/01  SUBJECTIVE:  CHIEF COMPLAINT:   Chief Complaint  Patient presents with  . Abnormal Lab    Was sent from ID clinic as LFTs were high. He is on IV rocephin for 1 month for Discitis. Had poor appetite and was dehydrated , when came.   LFT and renal func improved, tolerating diet well.  REVIEW OF SYSTEMS:  CONSTITUTIONAL: No fever, positive fatigue or weakness.  EYES: No blurred or double vision.  EARS, NOSE, AND THROAT: No tinnitus or ear pain.  RESPIRATORY: No cough, shortness of breath, wheezing or hemoptysis.  CARDIOVASCULAR: No chest pain, orthopnea, edema.  GASTROINTESTINAL: No nausea, vomiting, diarrhea , some abdominal pain.  GENITOURINARY: No dysuria, hematuria.  ENDOCRINE: No polyuria, nocturia,  HEMATOLOGY: No anemia, easy bruising or bleeding SKIN: No rash or lesion. MUSCULOSKELETAL: No joint pain or arthritis.   NEUROLOGIC: No tingling, numbness, weakness.  PSYCHIATRY: No anxiety or depression.   ROS  DRUG ALLERGIES:   Allergies  Allergen Reactions  . Neomycin-Bacitracin Zn-Polymyx Rash    VITALS:  Blood pressure 140/61, pulse 68, temperature 99.2 F (37.3 C), temperature source Oral, resp. rate 20, height 6\' 2"  (1.88 m), weight 92.534 kg (204 lb), SpO2 96 %.  PHYSICAL EXAMINATION:  GENERAL:  70 y.o.-year-old patient lying in the bed with no acute distress.  EYES: Pupils equal, round, reactive to light and accommodation. No scleral icterus. Extraocular muscles intact.  HEENT: Head atraumatic, normocephalic. Oropharynx and nasopharynx clear.  NECK:  Supple, no jugular venous distention. No thyroid enlargement, no tenderness.  LUNGS: Normal breath sounds bilaterally, no wheezing, rales,rhonchi or crepitation. No use of accessory muscles of respiration.  CARDIOVASCULAR: S1, S2 normal. No murmurs, rubs, or gallops.   ABDOMEN: Soft, nontender, nondistended. Bowel sounds present. No organomegaly or mass.  EXTREMITIES: No pedal edema, cyanosis, or clubbing.  NEUROLOGIC: Cranial nerves II through XII are intact. Muscle strength 5/5 in all extremities. Sensation intact. Gait not checked.  PSYCHIATRIC: The patient is alert and oriented x 3.  SKIN: No obvious rash, lesion, or ulcer.   Physical Exam LABORATORY PANEL:   CBC  Recent Labs Lab 06/14/15 0613  WBC 6.6  HGB 9.2*  HCT 27.7*  PLT 261   ------------------------------------------------------------------------------------------------------------------  Chemistries   Recent Labs Lab 06/15/15 0535  NA 134*  K 3.8  CL 106  CO2 24  GLUCOSE 97  BUN 18  CREATININE 0.99  CALCIUM 7.7*  AST 57*  ALT 197*  ALKPHOS 202*  BILITOT 0.5   ------------------------------------------------------------------------------------------------------------------  Cardiac Enzymes  Recent Labs Lab 06/13/15 1757  TROPONINI <0.03   ------------------------------------------------------------------------------------------------------------------  RADIOLOGY:  No results found.  ASSESSMENT AND PLAN:   Active Problems:   Acute renal failure (HCC)   Pressure ulcer   Acute liver failure   Cholelithiasis without cholecystitis   Elevated LFTs  1. Acute renal failure on chronic kidney disease stage III. IV fluid hydration and continue to monitor. Hold nephrotoxic medication.   Improved with IV fluids. 2. Increased liver function test. Hold Rocephin. Hold amiodarone. Hold Zocor. Recheck liver function test , coming down. Likely this is medication related.  Change Abx to vanc. 3. Streptococcus mutans discitis. ER physician gave a dose of vancomycin Appreciated infectious disease doctor Dr. Ola Spurr - suggested cont vanc. Pt already have a PICC line, and wife giving inj at home. Will check renal func tomorrow  again with vanc troph- and then d/c.  4.  History of pulmonary embolism on eliquis 5. Type 2 diabetes mellitus continue usual medications and sliding scale 6. History of Atrial fibrillation. holding amiodarone at this point. Continue metoprolol.  7. Hypotension. Give IV fluid hydration. Hold Cardura. Try to continue metoprolol.  All the records are reviewed and case discussed with Care Management/Social Workerr. Management plans discussed with the patient, family and they are in agreement.  CODE STATUS: Full  TOTAL TIME TAKING CARE OF THIS PATIENT: 35 minutes.    POSSIBLE D/C IN 2-3 DAYS, DEPENDING ON CLINICAL CONDITION.   Vaughan Basta M.D on 06/15/2015   Between 7am to 6pm - Pager - [phone]  After 6pm go to [email or web address] - password [email or web address] [facility]  Office  [phone]  CC: Primary care physician; Ashok Norris, MD  Note: This dictation was prepared with Dragon dictation along with smaller phrase technology. Any transcriptional errors that result from this process are unintentional.

## 2015-06-15 NOTE — Progress Notes (Signed)
Infectious Disease Long Term IV Antibiotic Orders  Diagnosis: Strep mutans bacteremia, osteomyeltis spine, ceftriaxone induced hepatitis  Culture results Strep mutans  Allergies:  Allergies  Allergen Reactions  . Neomycin-Bacitracin Zn-Polymyx Rash    Discharge antibiotics Vancomycin        1500 mg  every   12            hours .     Goal vancomycin trough 15-20.    Pharmacy to adjust dosing based on levels  PICC Care per protocol Labs weekly while on IV antibiotics      CBC w diff   Comprehensive met panel Vancomycin Trough   CRP   Planned duration of antibiotics  Until 4/4  Stop date 4/5  Follow up clinic dat   4/5 Wednesday   FAX weekly labs to [phone]  Leonel Ramsay, MD

## 2015-06-15 NOTE — Progress Notes (Signed)
Inpatient Diabetes Program Recommendations  AACE/ADA: New Consensus Statement on Inpatient Glycemic Control (2015)  Target Ranges:  Prepandial:   less than 140 mg/dL      Peak postprandial:   less than 180 mg/dL (1-2 hours)      Critically ill patients:  140 - 180 mg/dL   Review of Glycemic Control   Diabetes history: DM2  Outpatient Diabetes medications: Levemir 40 units + Novolog 4-12 units tid ac meals Current orders for Inpatient glycemic control: Novolog 0-9 units tid, Novolog 0-5 units qhs, Levemir 40 units qhs  Inpatient Diabetes Program Recommendations:  Fasting blood sugar 66mg /dl.  Please consider decreasing Levemir to 30 units qhs beginning this evening.    Gentry Fitz, RN, BA, MHA, CDE Diabetes Coordinator Inpatient Diabetes Program  [phone] (Team Pager) [phone] ([place]) 06/15/2015 11:22 AM

## 2015-06-15 NOTE — Progress Notes (Signed)
Pt stating that they had discontinued his fluids today. Notified Dr. Jannifer Franklin MD placed order.

## 2015-06-15 NOTE — Progress Notes (Signed)
CC: Elevated liver function tests Subjective: Patient with known gallstones who denies any upper abdominal pain at this time he has had elevated liver function tests is being treated for multiple medical problems at this time. He denies any abdominal pain in the upper abdomen. He also states his lower abdominal pain is only occasional and points to the lower quadrants in the infraumbilical areas. He has no pain right now  Objective: Vital signs in last 24 hours: Temp:  [98.1 F (36.7 C)-100.4 F (38 C)] 100.4 F (38 C) (03/23 0738) Pulse Rate:  [59-64] 60 (03/23 0738) Resp:  [16-19] 18 (03/23 0738) BP: (122-142)/(47-66) 122/54 mmHg (03/23 0738) SpO2:  [95 %-100 %] 96 % (03/23 0738) Last BM Date: 06/12/15  Intake/Output from previous day: 03/22 0701 - 03/23 0700 In: 2635 [P.O.:480; I.V.:2155] Out: 2325 [Urine:2325] Intake/Output this shift: Total I/O In: 120 [P.O.:120] Out: 300 [Urine:300]  Physical exam:  Pale patient vital signs are stable afebrile he appears comfortable Abdomen is soft and minimally distended nontender no peritoneal signs no Murphy sign  Lab Results: CBC   Recent Labs  06/13/15 1757 06/14/15 0613  WBC 8.8 6.6  HGB 11.4* 9.2*  HCT 34.3* 27.7*  PLT 321 261   BMET  Recent Labs  06/14/15 0613 06/15/15 0535  NA 135 134*  K 4.5 3.8  CL 109 106  CO2 25 24  GLUCOSE 248* 97  BUN 26* 18  CREATININE 1.65* 0.99  CALCIUM 7.6* 7.7*   PT/INR  Recent Labs  06/13/15 1757  LABPROT 18.8*  INR 1.57   ABG No results for input(s): PHART, HCO3 in the last 72 hours.  Invalid input(s): PCO2, PO2  Studies/Results: Dg Chest Port 1 View  06/13/2015  CLINICAL DATA:  Abnormal laboratory values. Possible liver or gallbladder abnormality. Initial encounter. EXAM: PORTABLE CHEST 1 VIEW COMPARISON:  Single-view of the chest 05/16/2015. FINDINGS: Right PICC is in place with the tip near the superior cavoatrial junction. Lungs are clear. No pneumothorax or  pleural effusion. Heart size normal per IMPRESSION: No acute disease. Electronically Signed   By: Inge Rise M.D.   On: 06/13/2015 18:10   US Abdomen Limited Ruq  06/13/2015  CLINICAL DATA:  Elevated liver function studies, right upper quadrant abdominal pain and postprandial nausea for 07-10 days. EXAM: US ABDOMEN LIMITED - RIGHT UPPER QUADRANT COMPARISON:  12/11/2008 FINDINGS: Gallbladder: Large shadowing gallstones in the gallbladder. The largest measures approximately 4.0 x 3.0 x 0.6 cm. No gallbladder wall thickening or pericholecystic fluid. Negative sonographic Murphy sign. Common bile duct: Diameter: 5.8 mm Liver: Normal echogenicity without focal lesion or biliary dilatation. IMPRESSION: Large shadowing gallstones in the gallbladder but no sonographic findings for acute cholecystitis. Next item normal Normal Caliber common bile duct and normal liver. Electronically Signed   By: Marijo Sanes M.D.   On: 06/13/2015 18:52    Anti-infectives: Anti-infectives    Start     Dose/Rate Route Frequency Ordered Stop   06/15/15 1000  vancomycin (VANCOCIN) 1,500 mg in sodium chloride 0.9 % 500 mL IVPB     1,500 mg 250 mL/hr over 120 Minutes Intravenous Every 12 hours 06/15/15 0905     06/13/15 1830  vancomycin (VANCOCIN) 2,000 mg in sodium chloride 0.9 % 500 mL IVPB     2,000 mg 250 mL/hr over 120 Minutes Intravenous  Once 06/13/15 1800 06/13/15 2110      Assessment/Plan:  's patient being evaluated for elevated liver function tests with known gallstones. 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 His wife understood and agreed with this plan  Florene Glen, MD,  FACS  06/15/2015

## 2015-06-15 NOTE — Care Management Important Message (Signed)
Important Message  Patient Details  Name: Eric Glover MRN: [id number] Date of Birth: 06-12-1945   Medicare Important Message Given:  Yes    [name] 06/15/2015, 1:05 PM

## 2015-06-15 NOTE — Care Management (Signed)
I spoke with patient's wife this morning and she agrees to talk to patient about LTAC but she is not interested in him going to [facility]. She said she recently had knee replacement and cannot drive very far. She said she will let RNCM know if they change there mind. I explained role of LTAC to patient's wife. She said she is managing well at home with him.

## 2015-06-15 NOTE — Progress Notes (Signed)
[facility] INFECTIOUS DISEASE PROGRESS NOTE Date of Admission:  06/13/2015     ID: Eric Glover is a 70 y.o. male with S mutans bacteremia, dicitis,elevated lfts  Active Problems:   Acute renal failure (HCC)   Pressure ulcer   Acute liver failure   Cholelithiasis without cholecystitis   Elevated LFTs   Subjective: Feels better, eating and drinking  ROS  Eleven systems are reviewed and negative except per hpi  Medications:  Antibiotics Given (last 72 hours)    None     . allopurinol  100 mg Oral Daily  . apixaban  5 mg Oral BID  . aspirin EC  81 mg Oral QHS  . budesonide (PULMICORT) nebulizer solution  0.25 mg Nebulization BID  . cyanocobalamin  500 mcg Oral Daily  . fentaNYL  50 mcg Transdermal Q72H  . insulin aspart  0-5 Units Subcutaneous QHS  . insulin aspart  0-9 Units Subcutaneous TID WC  . insulin detemir  40 Units Subcutaneous QHS  . lidocaine  1 patch Transdermal Q24H  . metoprolol tartrate  25 mg Oral BID  . pantoprazole  40 mg Oral Daily  . vancomycin  1,500 mg Intravenous Q12H    Objective: Vital signs in last 24 hours: Temp:  [98.1 F (36.7 C)-100.4 F (38 C)] 100.4 F (38 C) (03/23 0738) Pulse Rate:  [59-64] 60 (03/23 0738) Resp:  [16-19] 18 (03/23 0738) BP: (122-142)/(47-66) 122/54 mmHg (03/23 0738) SpO2:  [95 %-100 %] 96 % (03/23 0738) Constitutional: He is oriented to person, place, and time. He appears well-developed and well-nourished. No distress. obese HENT: anicteric,  Mouth/Throat: Oropharynx is clear and dry. No oropharyngeal exudate.  Cardiovascular: Normal rate, regular rhythm and normal heart sounds.  Pulmonary/Chest: Effort normal and breath sounds normal. No respiratory distress. He has no wheezes.  Abdominal: Soft. Bowel sounds are normal. He exhibits no distension. There is no tenderness.  Lymphadenopathy: He has no cervical adenopathy.  Neurological: He is alert and oriented to person, place, and time.  Skin: Skin  is warm and dry. No rash noted. No erythema.  Psychiatric: He has a normal mood and affect. His behavior is normal.  PICC RUE   Lab Results  Recent Labs  06/13/15 1757 06/14/15 0613 06/15/15 0535  WBC 8.8 6.6  --   HGB 11.4* 9.2*  --   HCT 34.3* 27.7*  --   NA 134* 135 134*  K 4.8 4.5 3.8  CL 99* 109 106  CO2 22 25 24   BUN 28* 26* 18  CREATININE 2.26* 1.65* 0.99   Hepatic Function Latest Ref Rng 06/15/2015 06/14/2015 06/13/2015  Total Protein 6.5 - 8.1 g/dL 5.3(L) 5.4(L) 7.2  Albumin 3.5 - 5.0 g/dL 2.3(L) 2.2(L) 3.1(L)  AST 15 - 41 U/L 57(H) 125(H) 284(H)  ALT 17 - 63 U/L 197(H) 285(H) 493(H)  Alk Phosphatase 38 - 126 U/L 202(H) 260(H) 371(H)  Total Bilirubin 0.3 - 1.2 mg/dL 0.5 0.7 1.6(H)     Microbiology: Results for orders placed or performed during the hospital encounter of 06/13/15  Blood Culture (routine x 2)     Status: None (Preliminary result)   Collection Time: 06/13/15  5:57 PM  Result Value Ref Range Status   Specimen Description BLOOD RIGHT PICC LINE  Final   Special Requests BOTTLES DRAWN AEROBIC AND ANAEROBIC 8CC  Final   Culture NO GROWTH 2 DAYS  Final   Report Status PENDING  Incomplete  Blood Culture (routine x 2)     Status:  None (Preliminary result)   Collection Time: 06/13/15  5:58 PM  Result Value Ref Range Status   Specimen Description BLOOD LEFT FATTY CASTS  Final   Special Requests BOTTLES DRAWN AEROBIC AND ANAEROBIC  3CC  Final   Culture NO GROWTH 2 DAYS  Final   Report Status PENDING  Incomplete  Urine culture     Status: None   Collection Time: 06/14/15  2:34 AM  Result Value Ref Range Status   Specimen Description URINE, RANDOM  Final   Special Requests NONE  Final   Culture NO GROWTH 1 DAY  Final   Report Status 06/15/2015 FINAL  Final    Studies/Results: Dg Chest Port 1 View  06/13/2015  CLINICAL DATA:  Abnormal laboratory values. Possible liver or gallbladder abnormality. Initial encounter. EXAM: PORTABLE CHEST 1 VIEW COMPARISON:   Single-view of the chest 05/16/2015. FINDINGS: Right PICC is in place with the tip near the superior cavoatrial junction. Lungs are clear. No pneumothorax or pleural effusion. Heart size normal per IMPRESSION: No acute disease. Electronically Signed   By: Inge Rise M.D.   On: 06/13/2015 18:10   US Abdomen Limited Ruq  06/13/2015  CLINICAL DATA:  Elevated liver function studies, right upper quadrant abdominal pain and postprandial nausea for 07-10 days. EXAM: US ABDOMEN LIMITED - RIGHT UPPER QUADRANT COMPARISON:  12/11/2008 FINDINGS: Gallbladder: Large shadowing gallstones in the gallbladder. The largest measures approximately 4.0 x 3.0 x 0.6 cm. No gallbladder wall thickening or pericholecystic fluid. Negative sonographic Murphy sign. Common bile duct: Diameter: 5.8 mm Liver: Normal echogenicity without focal lesion or biliary dilatation. IMPRESSION: Large shadowing gallstones in the gallbladder but no sonographic findings for acute cholecystitis. Next item normal Normal Caliber common bile duct and normal liver. Electronically Signed   By: Marijo Sanes M.D.   On: 06/13/2015 18:52    Assessment/Plan: [name] is a 70 y.o. male with Strep mutans bacteremia 2/13 and lumbar sacral discitis who has been on IV ceftraixone but now admitted with abd pain, nausea, dehydration and elevated LFTS. USS shows large Gallstones but no current obstruction.  I suspect he had biliary colic given the large gallstones, the abd sxs and the fact that he is clincially improving despite only missing one dose of ceftriaxone. However CTX can certainly cause biliary sludging and increased LFTS. He does report having a similar episode several years ago was seen in [place] and told needed surgery and was told had gallstones but then saw a surgeon in [facility] and told did not need surgery  LFTs improving.  Appreciate Dr York Cerise evaluation.   Recommendations Continue vancomycin in place of ceftriaxone Will need to  continue this until 4/5 then can switch to oral levofloxacin 750 qd for 2 more weeks.  I can see in fu at time of change to orals IV abx note done. Thank you very much for the consult. Will follow with you.  [place], [place]   06/15/2015, 2:38 PM

## 2015-06-16 LAB — GLUCOSE, CAPILLARY
GLUCOSE-CAPILLARY: 48 mg/dL — AB (ref 65–99)
GLUCOSE-CAPILLARY: 69 mg/dL (ref 65–99)
Glucose-Capillary: 92 mg/dL (ref 65–99)
Glucose-Capillary: 105 mg/dL — ABNORMAL HIGH (ref 65–99)
Glucose-Capillary: 194 mg/dL — ABNORMAL HIGH (ref 65–99)

## 2015-06-16 LAB — COMPREHENSIVE METABOLIC PANEL
ALK PHOS: 181 U/L — AB (ref 38–126)
ALT: 149 U/L — AB (ref 17–63)
ANION GAP: 4 — AB (ref 5–15)
AST: 45 U/L — ABNORMAL HIGH (ref 15–41)
Albumin: 2.3 g/dL — ABNORMAL LOW (ref 3.5–5.0)
BUN: 15 mg/dL (ref 6–20)
CALCIUM: 8 mg/dL — AB (ref 8.9–10.3)
CO2: 24 mmol/L (ref 22–32)
CREATININE: 0.94 mg/dL (ref 0.61–1.24)
Chloride: 107 mmol/L (ref 101–111)
Glucose, Bld: 104 mg/dL — ABNORMAL HIGH (ref 65–99)
Potassium: 4.2 mmol/L (ref 3.5–5.1)
Sodium: 135 mmol/L (ref 135–145)
Total Bilirubin: 0.8 mg/dL (ref 0.3–1.2)
Total Protein: 5.5 g/dL — ABNORMAL LOW (ref 6.5–8.1)

## 2015-06-16 MED ORDER — INSULIN DETEMIR 100 UNIT/ML ~~LOC~~ SOLN: 20.0000 [IU] | Freq: Every day | SUBCUTANEOUS | Status: DC

## 2015-06-16 MED ORDER — VANCOMYCIN HCL 10 G IV SOLR: 1500.0000 mg | Freq: Two times a day (BID) | INTRAVENOUS | Status: AC

## 2015-06-16 MED ORDER — SODIUM CHLORIDE 0.9% FLUSH: 10.0000 mL | INTRAVENOUS | Status: DC | PRN

## 2015-06-16 MED ORDER — INSULIN DETEMIR 100 UNIT/ML ~~LOC~~ SOLN
20.0000 [IU] | Freq: Every day | SUBCUTANEOUS | Status: DC
  Filled 2015-06-16: qty 0.2

## 2015-06-16 NOTE — Progress Notes (Addendum)
Pharmacy Antibiotic Note  Eric Glover is a 70 y.o. male admitted on 06/13/2015 with AKI and elevated LFTs. 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 Pharmacy is consulted to dose vancomycin.  Plan: Patient received vancomycin 2gm IV x 1 in ED on 3/21. Renal function much improved since admission.  Will continue vancomycin 1500 mg iv q 12 h.  Will check trough prior to 5th dose, which will be at steady state  Will closely monitor renal function and troughs as patient is at risk for accumulation of drug due to patient size.   Target trough: 15-84mcg/ml  Height: 6\' 2"  (188 cm) Weight: 204 lb (92.534 kg) IBW/kg (Calculated) : 82.2  Temp (24hrs), Avg:98.7 F (37.1 C), Min:98.2 F (36.8 C), Max:99.2 F (37.3 C)   Recent Labs Lab 06/13/15 1757 06/13/15 2255 06/14/15 0613 06/15/15 0535 06/16/15 0452  WBC 8.8  --  6.6  --   --   CREATININE 2.26*  --  1.65* 0.99 0.94  LATICACIDVEN 2.8* 1.0  --   --   --     Estimated Creatinine Clearance: 86.2 mL/min (by C-G formula based on Cr of 0.94).    Allergies  Allergen Reactions  . Neomycin-Bacitracin Zn-Polymyx Rash    Antimicrobials this admission: Vancomycin 2gm x 1 3/21 @ 1900 3/23 vancomycin >>  (ceftriaxone 2gm Q24H PTA)  Dose adjustments this admission:   Microbiology results: 2/13 BCx: strep mutans x 2   3/21 BCx: NGTD x 2 3/21 UCx: NG final    Thank you for allowing pharmacy to be a part of this patient's care.  Ulice Dash, PharmD Clinical Pharmacist   06/16/2015 8:14 AM

## 2015-06-16 NOTE — Progress Notes (Signed)
Pt wheeled to the car by volunteer.

## 2015-06-16 NOTE — Progress Notes (Signed)
Pt with low blood sugar, juice and sandwich given to pt. MD paged.

## 2015-06-16 NOTE — Care Management (Signed)
Patient discharging to home today followed by [facility]. I have notified Corene Cornea with [facility] that patient will need Vanco dose tonight. No further RNCM needs. Case closed.

## 2015-06-16 NOTE — Progress Notes (Signed)
DISCHARGE NOTE:  Pt given discharge instructions, pt verbalized understanding. PICC remains in place.

## 2015-06-16 NOTE — Progress Notes (Signed)
Spoke with Dr. Jannifer Franklin about fbs of 48. MD to place order.

## 2015-06-19 NOTE — Discharge Summary (Signed)
[facility] NAME: Eric Glover    MR#:  [id number]  DATE OF BIRTH:  22-Feb-1946  DATE OF ADMISSION:  06/13/2015 ADMITTING PHYSICIAN: Loletha Grayer, MD  DATE OF DISCHARGE: 06/16/2015 12:51 PM  PRIMARY CARE PHYSICIAN: Ashok Norris, MD    ADMISSION DIAGNOSIS:  Acute liver failure [K72.00] Elevated lactic acid level [E87.2] Cholelithiasis without cholecystitis [K80.20] Sepsis, due to unspecified organism ([place]) [A41.9] Acute renal failure, unspecified acute renal failure type ([place]) [N17.9]  DISCHARGE DIAGNOSIS:  Active Problems:   Acute renal failure (HCC)   Pressure ulcer   Acute liver failure   Cholelithiasis without cholecystitis   Elevated LFTs   Streptococcus mutans discitis  SECONDARY DIAGNOSIS:   Past Medical History  Diagnosis Date  . Diabetes mellitus without complication ([place])   . GERD (gastroesophageal reflux disease)   . Hyperlipidemia   . Hypertension   . CKD (chronic kidney disease), stage III   . Gout   . Staph infection 2017  . Discitis of lumbosacral region   . Discitis of lumbosacral region   . Pulmonary embolism ([place])   . Atrial fibrillation ([place])     HOSPITAL COURSE:   1. Acute renal failure on chronic kidney disease stage III. IV fluid hydration and continue to monitor. Hold nephrotoxic medication.  Improved with IV fluids. 2. Increased liver function test. Hold Rocephin. Hold amiodarone. Hold Zocor. Recheck liver function test , coming down. Likely this is medication related. Change Abx to vanc. 3. Streptococcus mutans discitis. ER physician gave a dose of vancomycin Appreciated infectious disease doctor Dr. Ola Spurr - suggested cont vanc. Pt already have a PICC line, and wife giving inj at home. Will check renal func tomorrow again with vanc troph- and then d/c.  4. History of pulmonary embolism on eliquis 5. Type 2 diabetes mellitus continue usual medications and  sliding scale 6. History of Atrial fibrillation. holding amiodarone at this point. Continue metoprolol.  7. Hypotension. Give IV fluid hydration. Hold Cardura. Try to continue metoprolol  DISCHARGE CONDITIONS:   Stable.  CONSULTS OBTAINED:  Treatment Team:  Adrian Prows, MD Florene Glen, MD  DRUG ALLERGIES:   Allergies  Allergen Reactions  . Neomycin-Bacitracin Zn-Polymyx Rash    DISCHARGE MEDICATIONS:   Discharge Medication List as of 06/16/2015 11:13 AM    START taking these medications   Details  vancomycin 1,500 mg in sodium chloride 0.9 % 500 mL Inject 1,500 mg into the vein every 12 (twelve) hours., Starting 06/16/2015, Until Wed 06/28/15, Normal      CONTINUE these medications which have CHANGED   Details  insulin detemir (LEVEMIR) 100 UNIT/ML injection Inject 0.2 mLs (20 Units total) into the skin at bedtime., Starting 06/16/2015, Until Discontinued, Normal      CONTINUE these medications which have NOT CHANGED   Details  allopurinol (ZYLOPRIM) 100 MG tablet Take 100 mg by mouth daily. , Until Discontinued, Historical Med    amiodarone (PACERONE) 200 MG tablet Take 200 mg by mouth daily., Until Discontinued, Historical Med    apixaban (ELIQUIS) 5 MG TABS tablet Take 5 mg by mouth 2 (two) times daily., Until Discontinued, Historical Med    aspirin EC 81 MG tablet Take 81 mg by mouth at bedtime. , Until Discontinued, Historical Med    beclomethasone (QVAR) 80 MCG/ACT inhaler Inhale 2 puffs into the lungs 2 (two) times daily as needed (for shortness of breath)., Until Discontinued, Historical Med    colchicine (COLCRYS) 0.6 MG  tablet Take 0.6 mg by mouth daily as needed (for gout flares). , Until Discontinued, Historical Med    doxazosin (CARDURA) 4 MG tablet Take 4 mg by mouth at bedtime., Until Discontinued, Historical Med    fentaNYL (DURAGESIC - DOSED MCG/HR) 50 MCG/HR Place 1 patch (50 mcg total) onto the skin every 3 (three) days., Starting 06/13/2015,  Until Discontinued, Print    fluticasone (FLONASE) 50 MCG/ACT nasal spray Place 1-2 sprays into both nostrils daily as needed for rhinitis., Until Discontinued, Historical Med    insulin aspart (NOVOLOG) 100 UNIT/ML injection Inject 4-12 Units into the skin 3 (three) times daily with meals as needed for high blood sugar. Pt uses as needed per sliding scale., Until Discontinued, Historical Med    lansoprazole (PREVACID) 15 MG capsule Take 15 mg by mouth daily. , Until Discontinued, Historical Med    lidocaine (LIDODERM) 5 % Place 1 patch onto the skin daily. Remove & Discard patch within 12 hours or as directed by MD, Starting 05/17/2015, Until Discontinued, Print    metoprolol tartrate (LOPRESSOR) 25 MG tablet Take 1 tablet (25 mg total) by mouth 2 (two) times daily., Starting 05/17/2015, Until Discontinued, Normal    nystatin (MYCOSTATIN) 100000 UNIT/ML suspension Take 5 mLs (500,000 Units total) by mouth 4 (four) times daily., Starting 05/17/2015, Until Discontinued, Print    oxyCODONE-acetaminophen (PERCOCET) 5-325 MG tablet Take 1 tablet by mouth every 4 (four) hours as needed for severe pain., Starting 06/13/2015, Until Discontinued, Print    polyethylene glycol (MIRALAX / GLYCOLAX) packet Take 17 g by mouth daily as needed for mild constipation., Until Discontinued, Historical Med    simvastatin (ZOCOR) 80 MG tablet Take 80 mg by mouth at bedtime. , Until Discontinued, Historical Med    vitamin B-12 (CYANOCOBALAMIN) 500 MCG tablet Take 500 mcg by mouth daily. , Until Discontinued, Historical Med      STOP taking these medications     cefTRIAXone 2 g in dextrose 5 % 50 mL          DISCHARGE INSTRUCTIONS:    Follow with PMD and ID clinic in 1-2 weeks, and lab results as ordered by Dr. Wenda Overland  If you experience worsening of your admission symptoms, develop shortness of breath, life threatening emergency, suicidal or homicidal thoughts you must seek medical attention immediately  by calling 911 or calling your MD immediately  if symptoms less severe.  You Must read complete instructions/literature along with all the possible adverse reactions/side effects for all the Medicines you take and that have been prescribed to you. Take any new Medicines after you have completely understood and accept all the possible adverse reactions/side effects.   Please note  You were cared for by a hospitalist during your hospital stay. If you have any questions about your discharge medications or the care you received while you were in the hospital after you are discharged, you can call the unit and asked to speak with the hospitalist on call if the hospitalist that took care of you is not available. Once you are discharged, your primary care physician will handle any further medical issues. Please note that NO REFILLS for any discharge medications will be authorized once you are discharged, as it is imperative that you return to your primary care physician (or establish a relationship with a primary care physician if you do not have one) for your aftercare needs so that they can reassess your need for medications and monitor your lab values.    Today  CHIEF COMPLAINT:   Chief Complaint  Patient presents with  . Abnormal Lab    HISTORY OF PRESENT ILLNESS:  Eric Glover  is a 70 y.o. male sent in for admission for abnormal laboratory data by Dr. Ola Spurr. Patient was in the hospital for Streptococcus mutans sepsis. He was set up with IV Rocephin as outpatient. A repeat MRI of the lumbar sacral spine as outpatient showed evidence of discitis. His IV Rocephin course was increased in duration. On routine blood testing done yesterday by Dr. Ola Spurr, his AST was elevated at 500 ALT 585 and alkaline phosphatase at 415. Repeat blood testing here in the ER showed an AST of 284 and ALT of 493 and alkaline phosphatase of 371. But also on today's kidney function it is worse at 2.26. The  patient stopped eating and drinking and he feels nauseous every time he does eat and drink. He still having back pain 1-2 out of 10 at rest and up to 6-7 out of 10 in intensity when he walks. Today when he saw his physician and his blood pressure was low when he stood up. Initially when he came into the ER his blood pressure was low also. He did respond to IV fluid bolus.  VITAL SIGNS:  Blood pressure 134/47, pulse 63, temperature 98.6 F (37 C), temperature source Oral, resp. rate 18, height 6\' 2"  (1.88 m), weight 92.534 kg (204 lb), SpO2 98 %.  I/O:  No intake or output data in the 24 hours ending 06/19/15 1018  PHYSICAL EXAMINATION:   GENERAL: 70 y.o.-year-old patient lying in the bed with no acute distress.  EYES: Pupils equal, round, reactive to light and accommodation. No scleral icterus. Extraocular muscles intact.  HEENT: Head atraumatic, normocephalic. Oropharynx and nasopharynx clear.  NECK: Supple, no jugular venous distention. No thyroid enlargement, no tenderness.  LUNGS: Normal breath sounds bilaterally, no wheezing, rales,rhonchi or crepitation. No use of accessory muscles of respiration.  CARDIOVASCULAR: S1, S2 normal. No murmurs, rubs, or gallops.  ABDOMEN: Soft, nontender, nondistended. Bowel sounds present. No organomegaly or mass.  EXTREMITIES: No pedal edema, cyanosis, or clubbing.  NEUROLOGIC: Cranial nerves II through XII are intact. Muscle strength 5/5 in all extremities. Sensation intact. Gait not checked.  PSYCHIATRIC: The patient is alert and oriented x 3.  SKIN: No obvious rash, lesion, or ulcer.   DATA REVIEW:   CBC  Recent Labs Lab 06/14/15 0613  WBC 6.6  HGB 9.2*  HCT 27.7*  PLT 261    Chemistries   Recent Labs Lab 06/16/15 0452  NA 135  K 4.2  CL 107  CO2 24  GLUCOSE 104*  BUN 15  CREATININE 0.94  CALCIUM 8.0*  AST 45*  ALT 149*  ALKPHOS 181*  BILITOT 0.8    Cardiac Enzymes  Recent Labs Lab 06/13/15 1757   TROPONINI <0.03    Microbiology Results  Results for orders placed or performed during the hospital encounter of 06/13/15  Blood Culture (routine x 2)     Status: None (Preliminary result)   Collection Time: 06/13/15  5:57 PM  Result Value Ref Range Status   Specimen Description BLOOD RIGHT PICC LINE  Final   Special Requests BOTTLES DRAWN AEROBIC AND ANAEROBIC 8CC  Final   Culture NO GROWTH 4 DAYS  Final   Report Status PENDING  Incomplete  Blood Culture (routine x 2)     Status: None (Preliminary result)   Collection Time: 06/13/15  5:58 PM  Result Value Ref Range Status  Specimen Description BLOOD LEFT FATTY CASTS  Final   Special Requests BOTTLES DRAWN AEROBIC AND ANAEROBIC  3CC  Final   Culture NO GROWTH 4 DAYS  Final   Report Status PENDING  Incomplete  Urine culture     Status: None   Collection Time: 06/14/15  2:34 AM  Result Value Ref Range Status   Specimen Description URINE, RANDOM  Final   Special Requests NONE  Final   Culture NO GROWTH 1 DAY  Final   Report Status 06/15/2015 FINAL  Final    RADIOLOGY:  No results found.  EKG:   Orders placed or performed during the hospital encounter of 06/13/15  . EKG 12-Lead  . EKG 12-Lead  . EKG 12-Lead  . EKG 12-Lead  . EKG      Management plans discussed with the patient, family and they are in agreement.  CODE STATUS:  Code Status History    Date Active Date Inactive Code Status Order ID Comments User Context   06/13/2015  8:41 PM 06/16/2015  3:52 PM Full Code [id number]  Loletha Grayer, MD ED   05/06/2015 11:47 PM 05/17/2015  8:09 PM Full Code [id number]  Lance Coon, MD Inpatient      TOTAL TIME TAKING CARE OF THIS PATIENT: 35 minutes.    Vaughan Basta M.D on 06/19/2015 at 10:18 AM  Between 7am to 6pm - Pager - [phone]  After 6pm go to [email or web address] - password [email or web address] [facility]  Office  [phone]  CC: Primary care physician; Ashok Norris, MD   Note: This  dictation was prepared with Dragon dictation along with smaller phrase technology. Any transcriptional errors that result from this process are unintentional.

## 2015-06-23 ENCOUNTER — Other Ambulatory Visit: Payer: Self-pay | Admitting: Family Medicine

## 2015-06-23 MED ORDER — METOPROLOL TARTRATE 25 MG PO TABS: 25.0000 mg | ORAL_TABLET | Freq: Two times a day (BID) | ORAL | Status: AC

## 2015-06-23 MED ORDER — AMIODARONE HCL 200 MG PO TABS: 200.0000 mg | ORAL_TABLET | Freq: Every day | ORAL | Status: AC

## 2015-06-23 MED ORDER — APIXABAN 5 MG PO TABS: 5.0000 mg | ORAL_TABLET | Freq: Two times a day (BID) | ORAL | Status: DC

## 2015-06-23 NOTE — Telephone Encounter (Signed)
Pt needs refill on Amiodarone, Metoprolol, Eliquis 5mg . [facility]

## 2015-06-23 NOTE — Telephone Encounter (Signed)
Refill request was sent to Dr. Krichna Sowles for approval and submission.  

## 2015-06-29 ENCOUNTER — Inpatient Hospital Stay
Admission: EM | Admit: 2015-06-29 | Discharge: 2015-07-05 | DRG: 682 | Disposition: A | Discharge status: Skilled Nursing Facility | Payer: Non-veteran care | Attending: Internal Medicine | Admitting: Internal Medicine

## 2015-06-29 ENCOUNTER — Emergency Department: Payer: Non-veteran care

## 2015-06-29 DIAGNOSIS — G9340 Encephalopathy, unspecified: Secondary | ICD-10-CM

## 2015-06-29 DIAGNOSIS — Z833 Family history of diabetes mellitus: Secondary | ICD-10-CM | POA: Diagnosis not present

## 2015-06-29 DIAGNOSIS — G9341 Metabolic encephalopathy: Secondary | ICD-10-CM | POA: Diagnosis present

## 2015-06-29 DIAGNOSIS — Z792 Long term (current) use of antibiotics: Secondary | ICD-10-CM

## 2015-06-29 DIAGNOSIS — Z8261 Family history of arthritis: Secondary | ICD-10-CM | POA: Diagnosis not present

## 2015-06-29 DIAGNOSIS — Z841 Family history of disorders of kidney and ureter: Secondary | ICD-10-CM | POA: Diagnosis not present

## 2015-06-29 DIAGNOSIS — N170 Acute kidney failure with tubular necrosis: Principal | ICD-10-CM | POA: Diagnosis present

## 2015-06-29 DIAGNOSIS — T368X5A Adverse effect of other systemic antibiotics, initial encounter: Secondary | ICD-10-CM | POA: Diagnosis present

## 2015-06-29 DIAGNOSIS — Z8249 Family history of ischemic heart disease and other diseases of the circulatory system: Secondary | ICD-10-CM | POA: Diagnosis not present

## 2015-06-29 DIAGNOSIS — D509 Iron deficiency anemia, unspecified: Secondary | ICD-10-CM | POA: Diagnosis present

## 2015-06-29 DIAGNOSIS — D649 Anemia, unspecified: Secondary | ICD-10-CM | POA: Diagnosis not present

## 2015-06-29 DIAGNOSIS — E785 Hyperlipidemia, unspecified: Secondary | ICD-10-CM | POA: Diagnosis present

## 2015-06-29 DIAGNOSIS — K219 Gastro-esophageal reflux disease without esophagitis: Secondary | ICD-10-CM | POA: Diagnosis present

## 2015-06-29 DIAGNOSIS — Z794 Long term (current) use of insulin: Secondary | ICD-10-CM | POA: Diagnosis not present

## 2015-06-29 DIAGNOSIS — Z86711 Personal history of pulmonary embolism: Secondary | ICD-10-CM | POA: Diagnosis not present

## 2015-06-29 DIAGNOSIS — N281 Cyst of kidney, acquired: Secondary | ICD-10-CM | POA: Diagnosis not present

## 2015-06-29 DIAGNOSIS — Z79899 Other long term (current) drug therapy: Secondary | ICD-10-CM

## 2015-06-29 DIAGNOSIS — N183 Chronic kidney disease, stage 3 (moderate): Secondary | ICD-10-CM | POA: Diagnosis present

## 2015-06-29 DIAGNOSIS — M4626 Osteomyelitis of vertebra, lumbar region: Secondary | ICD-10-CM | POA: Diagnosis present

## 2015-06-29 DIAGNOSIS — N179 Acute kidney failure, unspecified: Secondary | ICD-10-CM | POA: Diagnosis not present

## 2015-06-29 DIAGNOSIS — M4646 Discitis, unspecified, lumbar region: Secondary | ICD-10-CM

## 2015-06-29 DIAGNOSIS — M109 Gout, unspecified: Secondary | ICD-10-CM | POA: Diagnosis present

## 2015-06-29 DIAGNOSIS — R32 Unspecified urinary incontinence: Secondary | ICD-10-CM

## 2015-06-29 DIAGNOSIS — E1122 Type 2 diabetes mellitus with diabetic chronic kidney disease: Secondary | ICD-10-CM | POA: Diagnosis present

## 2015-06-29 DIAGNOSIS — I129 Hypertensive chronic kidney disease with stage 1 through stage 4 chronic kidney disease, or unspecified chronic kidney disease: Secondary | ICD-10-CM | POA: Diagnosis present

## 2015-06-29 DIAGNOSIS — I4891 Unspecified atrial fibrillation: Secondary | ICD-10-CM | POA: Diagnosis present

## 2015-06-29 DIAGNOSIS — W19XXXA Unspecified fall, initial encounter: Secondary | ICD-10-CM | POA: Diagnosis present

## 2015-06-29 DIAGNOSIS — N289 Disorder of kidney and ureter, unspecified: Secondary | ICD-10-CM | POA: Diagnosis not present

## 2015-06-29 DIAGNOSIS — M4640 Discitis, unspecified, site unspecified: Secondary | ICD-10-CM | POA: Diagnosis not present

## 2015-06-29 DIAGNOSIS — N19 Unspecified kidney failure: Secondary | ICD-10-CM

## 2015-06-29 LAB — CBC WITH DIFFERENTIAL/PLATELET
BASOS ABS: 0.1 10*3/uL (ref 0–0.1)
BASOS PCT: 1 %
EOS PCT: 1 %
Eosinophils Absolute: 0.1 10*3/uL (ref 0–0.7)
HCT: 25.2 % — ABNORMAL LOW (ref 40.0–52.0)
Hemoglobin: 8.3 g/dL — ABNORMAL LOW (ref 13.0–18.0)
Lymphocytes Relative: 11 %
Lymphs Abs: 1.1 10*3/uL (ref 1.0–3.6)
MCH: 26.1 pg (ref 26.0–34.0)
MCHC: 33.1 g/dL (ref 32.0–36.0)
MCV: 79.1 fL — ABNORMAL LOW (ref 80.0–100.0)
MONO ABS: 1.1 10*3/uL — AB (ref 0.2–1.0)
Monocytes Relative: 11 %
Neutro Abs: 7.6 10*3/uL — ABNORMAL HIGH (ref 1.4–6.5)
Neutrophils Relative %: 76 %
PLATELETS: 554 10*3/uL — AB (ref 150–440)
RBC: 3.19 MIL/uL — AB (ref 4.40–5.90)
RDW: 16.4 % — AB (ref 11.5–14.5)
WBC: 9.9 10*3/uL (ref 3.8–10.6)

## 2015-06-29 LAB — COMPREHENSIVE METABOLIC PANEL
ALK PHOS: 125 U/L (ref 38–126)
ALT: 28 U/L (ref 17–63)
AST: 30 U/L (ref 15–41)
Albumin: 2.9 g/dL — ABNORMAL LOW (ref 3.5–5.0)
Anion gap: 11 (ref 5–15)
BILIRUBIN TOTAL: 0.8 mg/dL (ref 0.3–1.2)
BUN: 30 mg/dL — AB (ref 6–20)
CALCIUM: 8.6 mg/dL — AB (ref 8.9–10.3)
CHLORIDE: 102 mmol/L (ref 101–111)
CO2: 22 mmol/L (ref 22–32)
CREATININE: 2.56 mg/dL — AB (ref 0.61–1.24)
GFR, EST AFRICAN AMERICAN: 28 mL/min — AB (ref >60)
GFR, EST NON AFRICAN AMERICAN: 24 mL/min — AB (ref >60)
Glucose, Bld: 143 mg/dL — ABNORMAL HIGH (ref 65–99)
Potassium: 4.7 mmol/L (ref 3.5–5.1)
SODIUM: 135 mmol/L (ref 135–145)
TOTAL PROTEIN: 6.7 g/dL (ref 6.5–8.1)

## 2015-06-29 LAB — URINALYSIS COMPLETE WITH MICROSCOPIC (ARMC ONLY)
BILIRUBIN URINE: NEGATIVE
Bacteria, UA: NONE SEEN
GLUCOSE, UA: NEGATIVE mg/dL
Leukocytes, UA: NEGATIVE
Nitrite: POSITIVE — AB
PH: 5 (ref 5.0–8.0)
Protein, ur: NEGATIVE mg/dL
SQUAMOUS EPITHELIAL / LPF: NONE SEEN
Specific Gravity, Urine: 1.012 (ref 1.005–1.030)
WBC, UA: NONE SEEN WBC/hpf (ref 0–5)

## 2015-06-29 LAB — LACTIC ACID, PLASMA: LACTIC ACID, VENOUS: 1.2 mmol/L (ref 0.5–2.0)

## 2015-06-29 LAB — AMMONIA: AMMONIA: 26 umol/L (ref 9–35)

## 2015-06-29 LAB — GLUCOSE, CAPILLARY: Glucose-Capillary: 133 mg/dL — ABNORMAL HIGH (ref 65–99)

## 2015-06-29 LAB — TROPONIN I: TROPONIN I: 0.11 ng/mL — AB (ref <0.031)

## 2015-06-29 MED ORDER — HEPARIN SODIUM (PORCINE) 5000 UNIT/ML IJ SOLN: 5000.0000 [IU] | Freq: Three times a day (TID) | INTRAMUSCULAR | Status: DC

## 2015-06-29 MED ORDER — HYDROCODONE-ACETAMINOPHEN 5-325 MG PO TABS
1.0000 | ORAL_TABLET | ORAL | Status: DC | PRN
  Administered 2015-07-04: 2 via ORAL
  Filled 2015-06-29: qty 2

## 2015-06-29 MED ORDER — ONDANSETRON HCL 4 MG/2ML IJ SOLN
4.0000 mg | Freq: Four times a day (QID) | INTRAMUSCULAR | Status: DC | PRN
  Administered 2015-06-30: 4 mg via INTRAVENOUS
  Filled 2015-06-29: qty 2

## 2015-06-29 MED ORDER — APIXABAN 5 MG PO TABS
5.0000 mg | ORAL_TABLET | Freq: Two times a day (BID) | ORAL | Status: DC
  Administered 2015-07-01 – 2015-07-05 (×9): 5 mg via ORAL
  Filled 2015-06-29 (×10): qty 1

## 2015-06-29 MED ORDER — MORPHINE SULFATE (PF) 2 MG/ML IV SOLN
2.0000 mg | INTRAVENOUS | Status: DC | PRN
  Administered 2015-06-29 – 2015-06-30 (×4): 2 mg via INTRAVENOUS
  Filled 2015-06-29 (×4): qty 1

## 2015-06-29 MED ORDER — BUDESONIDE 0.25 MG/2ML IN SUSP: 0.2500 mg | Freq: Two times a day (BID) | RESPIRATORY_TRACT | Status: DC | PRN

## 2015-06-29 MED ORDER — BECLOMETHASONE DIPROPIONATE 80 MCG/ACT IN AERS: 2.0000 | INHALATION_SPRAY | Freq: Two times a day (BID) | RESPIRATORY_TRACT | Status: DC | PRN

## 2015-06-29 MED ORDER — ACETAMINOPHEN 325 MG PO TABS: 650.0000 mg | ORAL_TABLET | Freq: Four times a day (QID) | ORAL | Status: DC | PRN

## 2015-06-29 MED ORDER — SODIUM CHLORIDE 0.9 % IV SOLN
INTRAVENOUS | Status: DC
Start: 2015-06-29 — End: 2015-06-30
  Administered 2015-06-30 (×2): via INTRAVENOUS

## 2015-06-29 MED ORDER — DIPHENHYDRAMINE HCL 50 MG/ML IJ SOLN
25.0000 mg | Freq: Once | INTRAMUSCULAR | Status: AC
  Administered 2015-06-29: 25 mg via INTRAVENOUS
  Filled 2015-06-29: qty 1

## 2015-06-29 MED ORDER — PIPERACILLIN-TAZOBACTAM 3.375 G IVPB 30 MIN
3.3750 g | Freq: Once | INTRAVENOUS | Status: AC
  Administered 2015-06-29: 3.375 g via INTRAVENOUS
  Filled 2015-06-29: qty 50

## 2015-06-29 MED ORDER — SODIUM CHLORIDE 0.9% FLUSH: 10.0000 mL | INTRAVENOUS | Status: DC | PRN

## 2015-06-29 MED ORDER — FLUTICASONE PROPIONATE 50 MCG/ACT NA SUSP: 1.0000 | Freq: Every day | NASAL | Status: DC | PRN

## 2015-06-29 MED ORDER — LORAZEPAM 2 MG/ML IJ SOLN
1.0000 mg | Freq: Four times a day (QID) | INTRAMUSCULAR | Status: DC | PRN
  Administered 2015-06-30 – 2015-07-03 (×3): 1 mg via INTRAVENOUS
  Filled 2015-06-29 (×3): qty 1

## 2015-06-29 MED ORDER — LORAZEPAM 2 MG/ML IJ SOLN
INTRAMUSCULAR | Status: AC
  Administered 2015-06-29: 1 mg via INTRAVENOUS
  Filled 2015-06-29: qty 1

## 2015-06-29 MED ORDER — LEVOFLOXACIN IN D5W 750 MG/150ML IV SOLN
750.0000 mg | Freq: Once | INTRAVENOUS | Status: AC
  Administered 2015-06-30: 750 mg via INTRAVENOUS
  Filled 2015-06-29: qty 150

## 2015-06-29 MED ORDER — DOCUSATE SODIUM 100 MG PO CAPS
100.0000 mg | ORAL_CAPSULE | Freq: Two times a day (BID) | ORAL | Status: DC
  Administered 2015-07-01 – 2015-07-05 (×8): 100 mg via ORAL
  Filled 2015-06-29 (×9): qty 1

## 2015-06-29 MED ORDER — FENTANYL 25 MCG/HR TD PT72
50.0000 ug | MEDICATED_PATCH | TRANSDERMAL | Status: DC
  Administered 2015-06-30 – 2015-07-03 (×2): 50 ug via TRANSDERMAL
  Filled 2015-06-29 (×2): qty 2

## 2015-06-29 MED ORDER — DOXAZOSIN MESYLATE 4 MG PO TABS
8.0000 mg | ORAL_TABLET | Freq: Every day | ORAL | Status: DC
  Administered 2015-07-01 – 2015-07-04 (×4): 8 mg via ORAL
  Filled 2015-06-29 (×4): qty 2

## 2015-06-29 MED ORDER — POLYETHYLENE GLYCOL 3350 17 G PO PACK
17.0000 g | PACK | Freq: Every day | ORAL | Status: DC | PRN
  Administered 2015-07-03: 17 g via ORAL
  Filled 2015-06-29: qty 1

## 2015-06-29 MED ORDER — ONDANSETRON HCL 4 MG PO TABS: 4.0000 mg | ORAL_TABLET | Freq: Four times a day (QID) | ORAL | Status: DC | PRN

## 2015-06-29 MED ORDER — SODIUM CHLORIDE 0.9 % IV BOLUS (SEPSIS)
500.0000 mL | Freq: Once | INTRAVENOUS | Status: AC
  Administered 2015-06-29: 500 mL via INTRAVENOUS

## 2015-06-29 MED ORDER — BISACODYL 10 MG RE SUPP: 10.0000 mg | Freq: Every day | RECTAL | Status: DC | PRN

## 2015-06-29 MED ORDER — AMIODARONE HCL 200 MG PO TABS
200.0000 mg | ORAL_TABLET | Freq: Every day | ORAL | Status: DC
  Administered 2015-07-01 – 2015-07-05 (×5): 200 mg via ORAL
  Filled 2015-06-29 (×6): qty 1

## 2015-06-29 MED ORDER — LEVOFLOXACIN IN D5W 750 MG/150ML IV SOLN
750.0000 mg | INTRAVENOUS | Status: DC
  Administered 2015-07-02 – 2015-07-04 (×2): 750 mg via INTRAVENOUS
  Filled 2015-06-29 (×3): qty 150

## 2015-06-29 MED ORDER — SODIUM CHLORIDE 0.9 % IV BOLUS (SEPSIS)
1000.0000 mL | Freq: Once | INTRAVENOUS | Status: AC
  Administered 2015-06-29: 1000 mL via INTRAVENOUS

## 2015-06-29 MED ORDER — INSULIN DETEMIR 100 UNIT/ML ~~LOC~~ SOLN
5.0000 [IU] | Freq: Every day | SUBCUTANEOUS | Status: DC
  Administered 2015-06-30 – 2015-07-04 (×6): 5 [IU] via SUBCUTANEOUS
  Filled 2015-06-29 (×7): qty 0.05

## 2015-06-29 MED ORDER — INSULIN ASPART 100 UNIT/ML ~~LOC~~ SOLN
0.0000 [IU] | SUBCUTANEOUS | Status: DC
  Administered 2015-06-30 (×2): 1 [IU] via SUBCUTANEOUS
  Administered 2015-06-30: 2 [IU] via SUBCUTANEOUS
  Administered 2015-07-01 (×4): 1 [IU] via SUBCUTANEOUS
  Administered 2015-07-01: 2 [IU] via SUBCUTANEOUS
  Administered 2015-07-01: 1 [IU] via SUBCUTANEOUS
  Administered 2015-07-02 – 2015-07-03 (×5): 2 [IU] via SUBCUTANEOUS
  Administered 2015-07-03: 1 [IU] via SUBCUTANEOUS
  Administered 2015-07-03: 2 [IU] via SUBCUTANEOUS
  Administered 2015-07-04: 3 [IU] via SUBCUTANEOUS
  Administered 2015-07-04 (×2): 2 [IU] via SUBCUTANEOUS
  Administered 2015-07-04: 3 [IU] via SUBCUTANEOUS
  Administered 2015-07-05 (×4): 2 [IU] via SUBCUTANEOUS
  Filled 2015-06-29: qty 2
  Filled 2015-06-29: qty 1
  Filled 2015-06-29: qty 3
  Filled 2015-06-29 (×2): qty 2
  Filled 2015-06-29 (×2): qty 1
  Filled 2015-06-29: qty 3
  Filled 2015-06-29: qty 2
  Filled 2015-06-29: qty 1
  Filled 2015-06-29 (×2): qty 2
  Filled 2015-06-29: qty 1
  Filled 2015-06-29: qty 2
  Filled 2015-06-29 (×2): qty 1
  Filled 2015-06-29 (×4): qty 2
  Filled 2015-06-29: qty 1
  Filled 2015-06-29: qty 2
  Filled 2015-06-29: qty 1
  Filled 2015-06-29: qty 2

## 2015-06-29 MED ORDER — VANCOMYCIN HCL IN DEXTROSE 1-5 GM/200ML-% IV SOLN
1000.0000 mg | Freq: Once | INTRAVENOUS | Status: AC
  Administered 2015-06-29: 1000 mg via INTRAVENOUS
  Filled 2015-06-29: qty 200

## 2015-06-29 MED ORDER — LORAZEPAM 2 MG/ML IJ SOLN
1.0000 mg | Freq: Once | INTRAMUSCULAR | Status: AC
  Administered 2015-06-29: 1 mg via INTRAVENOUS

## 2015-06-29 MED ORDER — ACETAMINOPHEN 650 MG RE SUPP: 650.0000 mg | Freq: Four times a day (QID) | RECTAL | Status: DC | PRN

## 2015-06-29 MED ORDER — ALTEPLASE 100 MG IV SOLR
2.0000 mg | Freq: Once | INTRAVENOUS | Status: AC
  Administered 2015-06-30: 2 mg
  Filled 2015-06-29: qty 2

## 2015-06-29 MED ORDER — ALBUTEROL SULFATE (2.5 MG/3ML) 0.083% IN NEBU
2.5000 mg | INHALATION_SOLUTION | RESPIRATORY_TRACT | Status: DC | PRN
  Administered 2015-07-04: 2.5 mg via RESPIRATORY_TRACT
  Filled 2015-06-29: qty 3

## 2015-06-29 NOTE — ED Notes (Signed)
Pt from home, reports he became dizzy and fell. No injuries reported due to the fall.

## 2015-06-29 NOTE — ED Notes (Signed)
Patient transported to CT 

## 2015-06-29 NOTE — ED Provider Notes (Signed)
[facility] Emergency Department Provider Note   ____________________________________________  Time seen: On EMS arrival  I have reviewed the triage vital signs and the nursing notes.   HISTORY  Chief Complaint Fall and Dizziness   History limited by: Not Limited   HPI Eric Glover is a 70 y.o. male who presents to the emergency department via EMS today after a fall. The patient states that he has been getting dizzy recently. He is currently receiving antibiotics for discitis secondary to a staph infection. He states he knows that he has been getting dizzy when he stands up. He denied any chest pain or shortness of breath associated with the episode today. Denied any traumatic injury today. States he was able to more or less sit himself back down. Denies any recent chest pain, abdominal pain or fevers. He does state however that a nurse told him recently he was anemic.     Past Medical History  Diagnosis Date  . Diabetes mellitus without complication ([place])   . GERD (gastroesophageal reflux disease)   . Hyperlipidemia   . Hypertension   . CKD (chronic kidney disease), stage III   . Gout   . Staph infection 2017  . Discitis of lumbosacral region   . Discitis of lumbosacral region   . Pulmonary embolism ([place])   . Atrial fibrillation [facility])     Patient Active Problem List   Diagnosis Date Noted  . Acute liver failure   . Cholelithiasis without cholecystitis   . Elevated LFTs   . Acute renal failure ([place]) 06/13/2015  . Pressure ulcer 06/13/2015  . A-fib ([place]) 05/30/2015  . Bacteremia due to Gram-positive bacteria 05/24/2015  . DDD (degenerative disc disease), lumbar 05/09/2015  . Sciatica associated with disorder of lumbar spine 05/09/2015  . HTN (hypertension) 05/06/2015  . Acute on chronic kidney failure ([place]) 05/06/2015  . Low back pain 05/06/2015  . Diabetes mellitus with neuropathy ([place]) 01/11/2015  . Benign hypertensive kidney disease  05/31/2010  . Chronic kidney disease (CKD), stage III (moderate) 05/31/2010  . Gout 05/31/2010  . HLD (hyperlipidemia) 05/31/2010  . Type 2 diabetes mellitus ([place]) 05/31/2010    Past Surgical History  Procedure Laterality Date  . Shoulder arthroscopy w/ rotator cuff repair    . Tee without cardioversion N/A 05/15/2015    Procedure: TRANSESOPHAGEAL ECHOCARDIOGRAM (TEE);  Surgeon: Teodoro Spray, MD;  Location: [facility] ORS;  Service: Cardiovascular;  Laterality: N/A;    Current Outpatient Rx  Name  Route  Sig  Dispense  Refill  . allopurinol (ZYLOPRIM) 100 MG tablet   Oral   Take 100 mg by mouth daily.          Marland Kitchen amiodarone (PACERONE) 200 MG tablet   Oral   Take 1 tablet (200 mg total) by mouth daily.   30 tablet   2   . apixaban (ELIQUIS) 5 MG TABS tablet   Oral   Take 1 tablet (5 mg total) by mouth 2 (two) times daily.   60 tablet   2   . aspirin EC 81 MG tablet   Oral   Take 81 mg by mouth at bedtime.          . beclomethasone (QVAR) 80 MCG/ACT inhaler   Inhalation   Inhale 2 puffs into the lungs 2 (two) times daily as needed (for shortness of breath).         . colchicine (COLCRYS) 0.6 MG tablet   Oral   Take 0.6 mg by  mouth daily as needed (for gout flares).          Marland Kitchen doxazosin (CARDURA) 4 MG tablet   Oral   Take 4 mg by mouth at bedtime.         . fentaNYL (DURAGESIC - DOSED MCG/HR) 50 MCG/HR   Transdermal   Place 1 patch (50 mcg total) onto the skin every 3 (three) days.   10 patch   0   . fluticasone (FLONASE) 50 MCG/ACT nasal spray   Each Nare   Place 1-2 sprays into both nostrils daily as needed for rhinitis.         Marland Kitchen insulin aspart (NOVOLOG) 100 UNIT/ML injection   Subcutaneous   Inject 4-12 Units into the skin 3 (three) times daily with meals as needed for high blood sugar. Pt uses as needed per sliding scale.         . insulin detemir (LEVEMIR) 100 UNIT/ML injection   Subcutaneous   Inject 0.2 mLs (20 Units total) into the skin at  bedtime.   10 mL   11   . lansoprazole (PREVACID) 15 MG capsule   Oral   Take 15 mg by mouth daily.          Marland Kitchen lidocaine (LIDODERM) 5 %   Transdermal   Place 1 patch onto the skin daily. Remove & Discard patch within 12 hours or as directed by MD   30 patch   0   . metoprolol tartrate (LOPRESSOR) 25 MG tablet   Oral   Take 1 tablet (25 mg total) by mouth 2 (two) times daily.   60 tablet   0   . nystatin (MYCOSTATIN) 100000 UNIT/ML suspension   Oral   Take 5 mLs (500,000 Units total) by mouth 4 (four) times daily.   60 mL   0   . oxyCODONE-acetaminophen (PERCOCET) 5-325 MG tablet   Oral   Take 1 tablet by mouth every 4 (four) hours as needed for severe pain.   30 tablet   0   . polyethylene glycol (MIRALAX / GLYCOLAX) packet   Oral   Take 17 g by mouth daily as needed for mild constipation.         . simvastatin (ZOCOR) 80 MG tablet   Oral   Take 80 mg by mouth at bedtime.          . vitamin B-12 (CYANOCOBALAMIN) 500 MCG tablet   Oral   Take 500 mcg by mouth daily.            Allergies Neomycin-bacitracin zn-polymyx  Family History  Problem Relation Age of Onset  . Alcohol abuse Paternal Uncle   . Early death Paternal Uncle   . Arthritis Mother   . Cancer Mother   . Diabetes Mother   . Hearing loss Mother   . Heart disease Mother   . Hyperlipidemia Mother   . Hypertension Mother   . Kidney disease Mother   . Diabetes Father   . Hearing loss Father   . Congestive Heart Failure Father     Social History Social History  Substance Use Topics  . Smoking status: Never Smoker   . Smokeless tobacco: None  . Alcohol Use: No    Review of Systems  Constitutional: Negative for fever. Cardiovascular: Negative for chest pain. Respiratory: Negative for shortness of breath. Gastrointestinal: Negative for abdominal pain, vomiting and diarrhea. Neurological: Negative for headaches, focal weakness or numbness.  10-point ROS otherwise  negative.  ____________________________________________  PHYSICAL EXAM:  VITAL SIGNS:    99.2 F (37.3 C)  89  17   148/70     Constitutional: Awake and alert. Appears slightly pale. Eyes: Conjunctivae are normal. PERRL. Normal extraocular movements. ENT   Head: Normocephalic and atraumatic.   Nose: No congestion/rhinnorhea.   Mouth/Throat: Mucous membranes are moist.   Neck: No stridor. Hematological/Lymphatic/Immunilogical: No cervical lymphadenopathy. Cardiovascular: Normal rate, regular rhythm.  No murmurs, rubs, or gallops. Respiratory: Normal respiratory effort without tachypnea nor retractions. Breath sounds are clear and equal bilaterally. No wheezes/rales/rhonchi. Gastrointestinal: Soft and nontender. No distention. There is no CVA tenderness. Genitourinary: Deferred Musculoskeletal: Normal range of motion in all extremities. No joint effusions.  No lower extremity tenderness nor edema. Neurologic:  Normal speech and language. No gross focal neurologic deficits are appreciated.  Skin:  Skin is warm, dry and intact. No rash noted. Psychiatric: Mood and affect are normal. Speech and behavior are normal. Patient exhibits appropriate insight and judgment.  ____________________________________________    LABS (pertinent positives/negatives)  Labs Reviewed  CBC WITH DIFFERENTIAL/PLATELET - Abnormal; Notable for the following:    RBC 3.19 (*)    Hemoglobin 8.3 (*)    HCT 25.2 (*)    MCV 79.1 (*)    RDW 16.4 (*)    Platelets 554 (*)    Neutro Abs 7.6 (*)    Monocytes Absolute 1.1 (*)    All other components within normal limits  COMPREHENSIVE METABOLIC PANEL - Abnormal; Notable for the following:    Glucose, Bld 143 (*)    BUN 30 (*)    Creatinine, Ser 2.56 (*)    Calcium 8.6 (*)    Albumin 2.9 (*)    GFR calc non Af Amer 24 (*)    GFR calc Af Amer 28 (*)    All other components within normal limits  URINALYSIS COMPLETEWITH MICROSCOPIC ([facility] ONLY)  - Abnormal; Notable for the following:    Color, Urine AMBER (*)    APPearance HAZY (*)    Ketones, ur TRACE (*)    Hgb urine dipstick 1+ (*)    Nitrite POSITIVE (*)    All other components within normal limits  TROPONIN I - Abnormal; Notable for the following:    Troponin I 0.11 (*)    All other components within normal limits  GLUCOSE, CAPILLARY - Abnormal; Notable for the following:    Glucose-Capillary 133 (*)    All other components within normal limits  CULTURE, BLOOD (ROUTINE X 2)  CULTURE, BLOOD (ROUTINE X 2)  LACTIC ACID, PLASMA  BASIC METABOLIC PANEL  CBC  AMMONIA     ____________________________________________   EKG  I, Nance Pear, attending physician, personally viewed and interpreted this EKG  EKG Time: 1603 Rate: 81 Rhythm: normal sinus rhythm Axis: normal Intervals: qtc 454 QRS: narrow ST changes: no st elevation Impression: probably left atrial enlargement otherwise normal ekg   ____________________________________________    RADIOLOGY  CT head  IMPRESSION: Mild atrophy without acute abnormality.  CXR  IMPRESSION: PICC line tip and lower SVC. New opacity at the right lung base representing atelectasis versus infiltrate. Mild atelectasis also identified at the left lung base.   ____________________________________________   PROCEDURES  Procedure(s) performed: None  Critical Care performed: Yes, see critical care note(s)  CRITICAL CARE Performed by: Nance Pear   Total critical care time: 35 minutes  Critical care time was exclusive of separately billable procedures and treating other patients.  Critical care was necessary to treat or prevent  imminent or life-threatening deterioration.  Critical care was time spent personally by me on the following activities: development of treatment plan with patient and/or surrogate as well as nursing, discussions with consultants, evaluation of patient's response to treatment,  examination of patient, obtaining history from patient or surrogate, ordering and performing treatments and interventions, ordering and review of laboratory studies, ordering and review of radiographic studies, pulse oximetry and re-evaluation of patient's condition.  ____________________________________________   INITIAL IMPRESSION / ASSESSMENT AND PLAN / ED COURSE  Pertinent labs & imaging results that were available during my care of the patient were reviewed by me and considered in my medical decision making (see chart for details).  Patient presented to the emergency department today because of a fall and feeling dizzy at home. No chest pain, fevers. Patient did not have a fever nor tachycardia nor hypotension when he first arrived. Additionally no leukocytosis or elevated lactate. However patient seemed to be getting slightly worse here in the emergency department so he was put on broad-spectrum antibiotics for possible infectious source. The patient did have a possible infiltrate to suggest pneumonia. Urine did have nitrate positive. Unable to obtain an MRI of lumbar spine given patient's movement. We will plan on admission to the hospitalist service for further workup and evaluation.  ____________________________________________   FINAL CLINICAL IMPRESSION(S) / ED DIAGNOSES  Final diagnoses:  Urinary incontinence  Incontinence  AKI (acute kidney injury) ([place])     Nance Pear, MD 06/29/15 2306

## 2015-06-29 NOTE — ED Notes (Signed)
Taking 25mg  Benadryl IV to patient in MRI due to severe tremors and inability to obtain MRI

## 2015-06-29 NOTE — ED Notes (Signed)
Unable to take accurate blood pressure due to patient's arms moving.

## 2015-06-29 NOTE — ED Notes (Signed)
Pt has fentanyl patch on left arm from home

## 2015-06-29 NOTE — ED Notes (Signed)
Patient transported to MRI 

## 2015-06-29 NOTE — ED Notes (Signed)
MD at bedside. 

## 2015-06-29 NOTE — ED Notes (Signed)
Straight Cath'd patient with assistance from [place], [place].  Pt tolerated well.  Per wife, pt is more confused today than he normally is.  Pt cooperative, but unable to answer any questions.

## 2015-06-29 NOTE — Progress Notes (Signed)
Pharmacy Antibiotic Note  Eric Glover is a 70 y.o. male admitted on 06/29/2015 with diskitis.  Pharmacy has been consulted for Levaquin dosing.  Plan: Levaquin 750 mg IV q 48 hours ordered.  Height: 6\' 2"  (188 cm) Weight: 213 lb 14.4 oz (97.024 kg) IBW/kg (Calculated) : 82.2  Temp (24hrs), Avg:98.8 F (37.1 C), Min:98.3 F (36.8 C), Max:99.2 F (37.3 C)   Recent Labs Lab 06/29/15 1610  WBC 9.9  CREATININE 2.56*  LATICACIDVEN 1.2    Estimated Creatinine Clearance: 31.7 mL/min (by C-G formula based on Cr of 2.56).    Allergies  Allergen Reactions  . Neomycin-Bacitracin Zn-Polymyx Rash    Antimicrobials this admission: Levaquin  >>    >>   Dose adjustments this admission:   Microbiology results: 4/6 BCx: pending   UA: LE(-) NO2 (+) WBC 0  Thank you for allowing pharmacy to be a part of this patient's care.  Izea Livolsi S 06/29/2015 11:41 PM

## 2015-06-29 NOTE — H&P (Signed)
[facility] [place] NAME: Eric Glover    MR#:  [id number]  DATE OF BIRTH:  12-31-1945  DATE OF ADMISSION:  06/29/2015  PRIMARY CARE PHYSICIAN: Ashok Norris, MD   REQUESTING/REFERRING PHYSICIAN: Dr. Kerman Passey  CHIEF COMPLAINT:   Chief Complaint  Patient presents with  . Fall  . Dizziness    HISTORY OF PRESENT ILLNESS:  Eric Glover  is a 70 y.o. male with a known history of Diabetes, hypertension, discitis with Streptococcus mutans bacteremia who has been treated with IV vancomycin at home finished his course on 06/27/2015 and transitioned to [place]. Today patient had a fall and was seen to be more confused and weak and brought to the emergency room. Here patient has received a dose of Ativan and Benadryl for an MRI of the back and he is more restless. He has also been found to have acute renal failure. Baseline creatinine is close to 1. Presently is at 2.5. He has been feeling dizzy and has had dark urine. As per the wife he has had good oral intake.   PAST MEDICAL HISTORY:   Past Medical History  Diagnosis Date  . Diabetes mellitus without complication ([place])   . GERD (gastroesophageal reflux disease)   . Hyperlipidemia   . Hypertension   . CKD (chronic kidney disease), stage III   . Gout   . Staph infection 2017  . Discitis of lumbosacral region   . Discitis of lumbosacral region   . Pulmonary embolism ([place])   . Atrial fibrillation ([place])     PAST SURGICAL HISTORY:   Past Surgical History  Procedure Laterality Date  . Shoulder arthroscopy w/ rotator cuff repair    . Tee without cardioversion N/A 05/15/2015    Procedure: TRANSESOPHAGEAL ECHOCARDIOGRAM (TEE);  Surgeon: Teodoro Spray, MD;  Location: [facility] ORS;  Service: Cardiovascular;  Laterality: N/A;    SOCIAL HISTORY:   Social History  Substance Use Topics  . Smoking status: Never Smoker   . Smokeless tobacco: Not on file  . Alcohol Use: No     FAMILY HISTORY:   Family History  Problem Relation Age of Onset  . Alcohol abuse Paternal Uncle   . Early death Paternal Uncle   . Arthritis Mother   . Cancer Mother   . Diabetes Mother   . Hearing loss Mother   . Heart disease Mother   . Hyperlipidemia Mother   . Hypertension Mother   . Kidney disease Mother   . Diabetes Father   . Hearing loss Father   . Congestive Heart Failure Father     DRUG ALLERGIES:   Allergies  Allergen Reactions  . Neomycin-Bacitracin Zn-Polymyx Rash    REVIEW OF SYSTEMS:   Review of Systems  Unable to perform ROS: mental status change    MEDICATIONS AT HOME:   Prior to Admission medications   Medication Sig Start Date End Date Taking? Authorizing Provider  allopurinol (ZYLOPRIM) 100 MG tablet Take 100 mg by mouth daily.    Yes Historical Provider, MD  amiodarone (PACERONE) 200 MG tablet Take 1 tablet (200 mg total) by mouth daily. 06/23/15  Yes Steele Sizer, MD  apixaban (ELIQUIS) 5 MG TABS tablet Take 1 tablet (5 mg total) by mouth 2 (two) times daily. 06/23/15  Yes Steele Sizer, MD  aspirin EC 81 MG tablet Take 81 mg by mouth at bedtime.    Yes Historical Provider, MD  beclomethasone (QVAR) 80 MCG/ACT inhaler Inhale 2 puffs  into the lungs 2 (two) times daily as needed (for shortness of breath).   Yes Historical Provider, MD  colchicine (COLCRYS) 0.6 MG tablet Take 0.6 mg by mouth daily as needed (for gout flares).    Yes Historical Provider, MD  doxazosin (CARDURA) 4 MG tablet Take 4 mg by mouth at bedtime.   Yes Historical Provider, MD  doxazosin (CARDURA) 8 MG tablet Take 1 tablet by mouth at bedtime. 03/23/15  Yes Historical Provider, MD  fentaNYL (DURAGESIC - DOSED MCG/HR) 50 MCG/HR Place 1 patch (50 mcg total) onto the skin every 3 (three) days. 06/13/15  Yes Steele Sizer, MD  ferrous sulfate 325 (65 FE) MG tablet Take 1 tablet by mouth daily. 06/29/15 06/28/16 Yes Historical Provider, MD  fluticasone (FLONASE) 50 MCG/ACT nasal  spray Place 1-2 sprays into both nostrils daily as needed for rhinitis.   Yes Historical Provider, MD  furosemide (LASIX) 20 MG tablet Take 1 tablet by mouth daily. 03/27/15  Yes Historical Provider, MD  insulin aspart (NOVOLOG) 100 UNIT/ML injection Inject 4-12 Units into the skin 3 (three) times daily with meals as needed for high blood sugar. Pt uses as needed per sliding scale.   Yes Historical Provider, MD  insulin detemir (LEVEMIR) 100 UNIT/ML injection Inject 0.2 mLs (20 Units total) into the skin at bedtime. 06/16/15  Yes Vaughan Basta, MD  ipratropium (ATROVENT) 0.03 % nasal spray Place 1 spray into both nostrils every 12 (twelve) hours. 06/22/15  Yes Historical Provider, MD  lansoprazole (PREVACID) 15 MG capsule Take 15 mg by mouth daily.    Yes Historical Provider, MD  levofloxacin (LEVAQUIN) 750 MG tablet Take 1 tablet by mouth daily. 06/27/15 07/20/15 Yes Historical Provider, MD  lidocaine (LIDODERM) 5 % Place 1 patch onto the skin daily. Remove & Discard patch within 12 hours or as directed by MD 05/17/15  Yes Loletha Grayer, MD  metoprolol tartrate (LOPRESSOR) 25 MG tablet Take 1 tablet (25 mg total) by mouth 2 (two) times daily. 06/23/15  Yes Steele Sizer, MD  nystatin (MYCOSTATIN) 100000 UNIT/ML suspension Take 5 mLs (500,000 Units total) by mouth 4 (four) times daily. 05/17/15  Yes Loletha Grayer, MD  oxyCODONE (OXY IR/ROXICODONE) 5 MG immediate release tablet Take 5 mg by mouth every 6 (six) hours as needed for moderate pain.  06/27/15  Yes Historical Provider, MD  oxyCODONE-acetaminophen (PERCOCET) 5-325 MG tablet Take 1 tablet by mouth every 4 (four) hours as needed for severe pain. 06/13/15  Yes Steele Sizer, MD  phenazopyridine (PYRIDIUM) 100 MG tablet Take 1 tablet by mouth 3 (three) times daily. 06/26/15 07/02/15 Yes Historical Provider, MD  polyethylene glycol (MIRALAX / GLYCOLAX) packet Take 17 g by mouth daily as needed for mild constipation.   Yes Historical Provider, MD   propranolol ER (INDERAL LA) 80 MG 24 hr capsule Take 1 capsule by mouth daily. 06/19/15  Yes Historical Provider, MD  simvastatin (ZOCOR) 80 MG tablet Take 80 mg by mouth at bedtime.    Yes Historical Provider, MD  vitamin B-12 (CYANOCOBALAMIN) 500 MCG tablet Take 500 mcg by mouth daily.    Yes Historical Provider, MD     VITAL SIGNS:  Blood pressure 121/94, pulse 77, temperature 99.2 F (37.3 C), temperature source Oral, resp. rate 19, SpO2 96 %.  PHYSICAL EXAMINATION:  Physical Exam  GENERAL:  70 y.o.-year-old patient lying in the bed , Restless EYES: Pupils equal, round, reactive to light and accommodation. No scleral icterus. Extraocular muscles intact.  HEENT: Head atraumatic, normocephalic.  Oropharynx and nasopharynx clear. No oropharyngeal erythema, moist oral mucosa  NECK:  Supple, no jugular venous distention. No thyroid enlargement, no tenderness.  LUNGS: Normal breath sounds bilaterally, no wheezing, rales, rhonchi. No use of accessory muscles of respiration.  CARDIOVASCULAR: S1, S2 normal. No murmurs, rubs, or gallops.  ABDOMEN: Soft, nontender, nondistended. Bowel sounds present. No organomegaly or mass.  EXTREMITIES: No pedal edema, cyanosis, or clubbing. + 2 pedal & radial pulses b/l.   NEUROLOGIC: Cranial nerves II through XII are intact. No focal Motor or sensory deficits appreciated b/l. Straight leg raising test negative PSYCHIATRIC: The patient is drowsy but follows commands. SKIN: No obvious rash, lesion, or ulcer.  Some bruising of the lower back from previous fall.  LABORATORY PANEL:   CBC  Recent Labs Lab 06/29/15 1610  WBC 9.9  HGB 8.3*  HCT 25.2*  PLT 554*   ------------------------------------------------------------------------------------------------------------------  Chemistries   Recent Labs Lab 06/29/15 1610  NA 135  K 4.7  CL 102  CO2 22  GLUCOSE 143*  BUN 30*  CREATININE 2.56*  CALCIUM 8.6*  AST 30  ALT 28  ALKPHOS 125   BILITOT 0.8   ------------------------------------------------------------------------------------------------------------------  Cardiac Enzymes  Recent Labs Lab 06/29/15 1610  TROPONINI 0.11*   ------------------------------------------------------------------------------------------------------------------  RADIOLOGY:  Dg Chest 2 View  06/29/2015  CLINICAL DATA:  PICC line placement.  Hypoxia. EXAM: CHEST - 2 VIEW COMPARISON:  06/13/2015 FINDINGS: Right upper extremity PICC line visible. The tip lies in the lower SVC. Lungs show new opacity at the right base consistent with atelectasis versus infiltrate. There also is some atelectasis at the left lung base. No edema or pleural fluid. The heart size and mediastinal contours are normal. IMPRESSION: PICC line tip and lower SVC. New opacity at the right lung base representing atelectasis versus infiltrate. Mild atelectasis also identified at the left lung base. Electronically Signed   By: Aletta Edouard M.D.   On: 06/29/2015 17:26   Ct Head Wo Contrast  06/29/2015  CLINICAL DATA:  Dizziness and recent fall EXAM: CT HEAD WITHOUT CONTRAST TECHNIQUE: Contiguous axial images were obtained from the base of the skull through the vertex without intravenous contrast. COMPARISON:  05/16/2015 FINDINGS: The bony calvarium is intact. No gross soft tissue abnormality is seen. Mild atrophic changes are noted. No findings to suggest acute hemorrhage, acute infarction or space-occupying mass lesion are noted. Basal ganglia calcifications are seen bilaterally. IMPRESSION: Mild atrophy without acute abnormality. Electronically Signed   By: Inez Catalina M.D.   On: 06/29/2015 16:26     IMPRESSION AND PLAN:   * Acute renal failure Likely due to dehydration. Patient has been feeling dizzy at home. He was also on vancomycin until recently and stopped it 2 days back which could be one of the causes. Start IV fluids. Monitor input and output and daily  weight. Consult nephrology if any worsening. Patient has had similar admissions due to dehydration in the past 2 months.  * Altered mental status. Patient's confusion is worse than normal as per wife. He has had fluctuating mentation since he's been diagnosed with this infection. Likely from acute renal failure. He has also received Ativan and Benadryl for an MRI which made things worse in the emergency room. Fall precautions. CT scan of the head showed nothing acute. NPO till patient is more awake.  * Discitis Patient has been treated with IV vancomycin until 06/27/2015 and transitioned to oral Levaquin. Blood cultures have been drawn in the emergency room. He is afebrile  and has normal WBC. We'll continue Levaquin through IV as patient is nothing by mouth.  * Diabetes mellitus Continue Lantus at 5 units as he is nothing by mouth. Sliding scale insulin.  * Hypertension Use IV when necessary medications  * Mildly elevated troponin of 0.11 he does have mild chronic elevation of troponin.  * DVT prophylaxis with subcutaneous heparin  All the records are reviewed and case discussed with ED provider. Management plans discussed with the family and they are in agreement.  CODE STATUS: FULL CODE  TOTAL TIME TAKING CARE OF THIS PATIENT: 40 minutes.   Hillary Bow R M.D on 06/29/2015 at 9:23 PM  Between 7am to 6pm - Pager - [phone]  After 6pm go to [email or web address] - password [email or web address] [facility]  Office  [phone]  CC: Primary care physician; Ashok Norris, MD  Note: This dictation was prepared with Dragon dictation along with smaller phrase technology. Any transcriptional errors that result from this process are unintentional.

## 2015-06-30 ENCOUNTER — Inpatient Hospital Stay: Payer: Non-veteran care

## 2015-06-30 LAB — VITAMIN B12: VITAMIN B 12: 745 pg/mL (ref 180–914)

## 2015-06-30 LAB — GLUCOSE, CAPILLARY
GLUCOSE-CAPILLARY: 130 mg/dL — AB (ref 65–99)
GLUCOSE-CAPILLARY: 79 mg/dL (ref 65–99)
GLUCOSE-CAPILLARY: 154 mg/dL — AB (ref 65–99)
Glucose-Capillary: 145 mg/dL — ABNORMAL HIGH (ref 65–99)
Glucose-Capillary: 189 mg/dL — ABNORMAL HIGH (ref 65–99)
Glucose-Capillary: 59 mg/dL — ABNORMAL LOW (ref 65–99)
Glucose-Capillary: 65 mg/dL (ref 65–99)

## 2015-06-30 LAB — IRON AND TIBC
Iron: 10 ug/dL — ABNORMAL LOW (ref 45–182)
SATURATION RATIOS: 5 % — AB (ref 17.9–39.5)
TIBC: 196 ug/dL — ABNORMAL LOW (ref 250–450)
UIBC: 186 ug/dL

## 2015-06-30 LAB — BASIC METABOLIC PANEL
ANION GAP: 3 — AB (ref 5–15)
BUN: 28 mg/dL — ABNORMAL HIGH (ref 6–20)
CHLORIDE: 105 mmol/L (ref 101–111)
CO2: 24 mmol/L (ref 22–32)
Calcium: 8.3 mg/dL — ABNORMAL LOW (ref 8.9–10.3)
Creatinine, Ser: 2.79 mg/dL — ABNORMAL HIGH (ref 0.61–1.24)
GFR calc non Af Amer: 22 mL/min — ABNORMAL LOW (ref >60)
GFR, EST AFRICAN AMERICAN: 25 mL/min — AB (ref >60)
Glucose, Bld: 121 mg/dL — ABNORMAL HIGH (ref 65–99)
Potassium: 4.8 mmol/L (ref 3.5–5.1)
Sodium: 132 mmol/L — ABNORMAL LOW (ref 135–145)

## 2015-06-30 LAB — CBC
HCT: 22.5 % — ABNORMAL LOW (ref 40.0–52.0)
HEMOGLOBIN: 7.3 g/dL — AB (ref 13.0–18.0)
MCH: 25.5 pg — AB (ref 26.0–34.0)
MCHC: 32.5 g/dL (ref 32.0–36.0)
MCV: 78.5 fL — AB (ref 80.0–100.0)
Platelets: 505 10*3/uL — ABNORMAL HIGH (ref 150–440)
RBC: 2.87 MIL/uL — AB (ref 4.40–5.90)
RDW: 16 % — ABNORMAL HIGH (ref 11.5–14.5)
WBC: 9.4 10*3/uL (ref 3.8–10.6)

## 2015-06-30 LAB — RETICULOCYTES
RBC.: 2.92 MIL/uL — AB (ref 4.40–5.90)
RETIC COUNT ABSOLUTE: 96.4 10*3/uL (ref 19.0–183.0)
RETIC CT PCT: 3.3 % — AB (ref 0.4–3.1)

## 2015-06-30 LAB — FERRITIN: Ferritin: 616 ng/mL — ABNORMAL HIGH (ref 24–336)

## 2015-06-30 LAB — TSH: TSH: 2.135 u[IU]/mL (ref 0.350–4.500)

## 2015-06-30 LAB — FOLATE: FOLATE: 16.4 ng/mL (ref >5.9)

## 2015-06-30 MED ORDER — ALLOPURINOL 100 MG PO TABS
100.0000 mg | ORAL_TABLET | Freq: Every day | ORAL | Status: DC
  Administered 2015-07-01 – 2015-07-05 (×5): 100 mg via ORAL
  Filled 2015-06-30 (×6): qty 1

## 2015-06-30 MED ORDER — METOPROLOL TARTRATE 25 MG PO TABS
25.0000 mg | ORAL_TABLET | Freq: Two times a day (BID) | ORAL | Status: DC
  Administered 2015-07-01 – 2015-07-05 (×9): 25 mg via ORAL
  Filled 2015-06-30 (×10): qty 1

## 2015-06-30 MED ORDER — FERROUS SULFATE 325 (65 FE) MG PO TABS
325.0000 mg | ORAL_TABLET | Freq: Every day | ORAL | Status: DC
  Administered 2015-07-01: 325 mg via ORAL
  Filled 2015-06-30: qty 1

## 2015-06-30 MED ORDER — DEXTROSE-NACL 5-0.9 % IV SOLN
INTRAVENOUS | Status: DC
  Administered 2015-06-30: 18:00:00 via INTRAVENOUS

## 2015-06-30 MED ORDER — HALOPERIDOL LACTATE 5 MG/ML IJ SOLN
2.0000 mg | Freq: Once | INTRAMUSCULAR | Status: AC
Start: 2015-06-30 — End: 2015-06-30
  Administered 2015-06-30: 2 mg via INTRAVENOUS
  Filled 2015-06-30: qty 1

## 2015-06-30 MED ORDER — DIPHENHYDRAMINE HCL 50 MG/ML IJ SOLN
25.0000 mg | Freq: Once | INTRAMUSCULAR | Status: AC
  Administered 2015-06-30: 25 mg via INTRAVENOUS
  Filled 2015-06-30: qty 1

## 2015-06-30 MED ORDER — DEXTROSE 50 % IV SOLN
1.0000 | Freq: Once | INTRAVENOUS | Status: AC
  Administered 2015-06-30: 50 mL via INTRAVENOUS
  Filled 2015-06-30: qty 50

## 2015-06-30 MED ORDER — PANTOPRAZOLE SODIUM 40 MG PO TBEC
40.0000 mg | DELAYED_RELEASE_TABLET | Freq: Every day | ORAL | Status: DC
  Administered 2015-07-02 – 2015-07-05 (×4): 40 mg via ORAL
  Filled 2015-06-30 (×4): qty 1

## 2015-06-30 MED ORDER — PROPRANOLOL HCL ER 80 MG PO CP24: 80.0000 mg | ORAL_CAPSULE | Freq: Every day | ORAL | Status: DC

## 2015-06-30 MED ORDER — DEXTROSE 5 % IV SOLN
INTRAVENOUS | Status: DC
  Administered 2015-06-30: 17:00:00 via INTRAVENOUS

## 2015-06-30 MED FILL — Alteplase For Inj 2 MG: INTRAMUSCULAR | Qty: 2 | Status: AC

## 2015-06-30 NOTE — Progress Notes (Signed)
[facility]  Patient Status: Active  [facility] is providing the following services: SN/PT/HHA  If patient discharges after hours, please call [phone].   Eric Glover 06/30/2015, 9:45 AM

## 2015-06-30 NOTE — Progress Notes (Signed)
[place] at [place] NAME: Eric Glover    MR#:  [id number]  DATE OF BIRTH:  [birth year]  SUBJECTIVE:   Patient was restless all night and therefore received medicine for his restlessness and gout is sleeping. Wife is at bedside. Wife reports that since he was diagnosed with discitis over 2 months ago he has slowly declined in his mental status.  REVIEW OF SYSTEMS:    Review of Systems  Unable to perform ROS: medical condition    Tolerating Diet:npo      DRUG ALLERGIES:   Allergies  Allergen Reactions  . Neomycin-Bacitracin Zn-Polymyx Rash    VITALS:  Blood pressure 148/62, pulse 74, temperature 98.3 F (36.8 C), temperature source Oral, resp. rate 18, height 6\' 2"  (1.88 m), weight 97 kg (213 lb 13.5 oz), SpO2 94 %.  PHYSICAL EXAMINATION:   Physical Exam  Constitutional: He is well-developed, well-nourished, and in no distress. No distress.  HENT:  Head: Normocephalic.  Eyes: No scleral icterus.  Neck: No JVD present. No tracheal deviation present.  Cardiovascular: Normal rate, regular rhythm and normal heart sounds.  Exam reveals no gallop and no friction rub.   No murmur heard. Pulmonary/Chest: Effort normal and breath sounds normal. No respiratory distress. He has no wheezes. He has no rales. He exhibits no tenderness.  Abdominal: Soft. Bowel sounds are normal. He exhibits no distension and no mass. There is no tenderness. There is no rebound and no guarding.  Musculoskeletal: Normal range of motion. He exhibits no edema.  Lymphadenopathy:    He has no cervical adenopathy.  Neurological:  Somnolent   Skin: Skin is warm. No rash noted. No erythema.      LABORATORY PANEL:   CBC  Recent Labs Lab 06/30/15 0440  WBC 9.4  HGB 7.3*  HCT 22.5*  PLT 505*   ------------------------------------------------------------------------------------------------------------------  Chemistries   Recent  Labs Lab 06/29/15 1610 06/30/15 0440  NA 135 132*  K 4.7 4.8  CL 102 105  CO2 22 24  GLUCOSE 143* 121*  BUN 30* 28*  CREATININE 2.56* 2.79*  CALCIUM 8.6* 8.3*  AST 30  --   ALT 28  --   ALKPHOS 125  --   BILITOT 0.8  --    ------------------------------------------------------------------------------------------------------------------  Cardiac Enzymes  Recent Labs Lab 06/29/15 1610  TROPONINI 0.11*   ------------------------------------------------------------------------------------------------------------------  RADIOLOGY:  Dg Chest 2 View  06/29/2015  CLINICAL DATA:  PICC line placement.  Hypoxia. EXAM: CHEST - 2 VIEW COMPARISON:  06/13/2015 FINDINGS: Right upper extremity PICC line visible. The tip lies in the lower SVC. Lungs show new opacity at the right base consistent with atelectasis versus infiltrate. There also is some atelectasis at the left lung base. No edema or pleural fluid. The heart size and mediastinal contours are normal. IMPRESSION: PICC line tip and lower SVC. New opacity at the right lung base representing atelectasis versus infiltrate. Mild atelectasis also identified at the left lung base. Electronically Signed   By: Aletta Edouard M.D.   On: 06/29/2015 17:26   Ct Head Wo Contrast  06/29/2015  CLINICAL DATA:  Dizziness and recent fall EXAM: CT HEAD WITHOUT CONTRAST TECHNIQUE: Contiguous axial images were obtained from the base of the skull through the vertex without intravenous contrast. COMPARISON:  05/16/2015 FINDINGS: The bony calvarium is intact. No gross soft tissue abnormality is seen. Mild atrophic changes are noted. No findings to suggest acute hemorrhage, acute infarction or space-occupying mass lesion are noted.  Basal ganglia calcifications are seen bilaterally. IMPRESSION: Mild atrophy without acute abnormality. Electronically Signed   By: Inez Catalina M.D.   On: 06/29/2015 16:26     ASSESSMENT AND PLAN:    70 year old male with a history of  diabetes, essential hypertension and Streptococcus mutans discitis and bacteremia on vancomycin and since April 4 changed to [place] who is admitted to the hospital with confusion and weakness and found to have acute renal failure.  1. Acute renal failure: This may be due to ATN or related to vancomycin which she was on for discitis. Creatinine continues to increase despite holding nephrotoxic agents. Order renal ultrasound. Consult nephrology  2. Acute metabolic encephalopathy: This is likely due to acute renal failure CT head on admission showed no acute changes. If no improvement consider ordering EEG/MRI. Check TSH and B12.  3. History of discitis: Patient is now on oral Levaquin. He was on vancomycin until April 4. Patient was not able to tolerate MRI of the lumbar spine which is ordered by admitting physician. Follow up on blood cultures.  4. Diabetes: Continue sliding scale insulin, ADA diet and Lantus.  5. Essential hypertension: Continue metoprolol and when necessary hydralazine.  6. Acute on chronic anemia: Hemoglobin 7.3. Order anemia panel. Transfuse if hemoglobin greater than or equal to 7.   Management plans discussed with the patient's wife and she is in agreement.  CODE STATUS: FULL  TOTAL TIME TAKING CARE OF THIS PATIENT: 33 minutes.     POSSIBLE D/C 3-4 days, DEPENDING ON CLINICAL CONDITION.   Gwynn Chalker M.D on 06/30/2015 at 11:11 AM  Between 7am to 6pm - Pager - [phone] After 6pm go to [email or web address] - password [email or web address] [facility]  Office  [phone]  CC: Primary care physician; Ashok Norris, MD  Note: This dictation was prepared with Dragon dictation along with smaller phrase technology. Any transcriptional errors that result from this process are unintentional.

## 2015-06-30 NOTE — Progress Notes (Signed)
Radiology called me to let me know that ULTRASOUND RENAL shows There is a complex cystic mass in the lower pole of the right kidney with multiple thin septations. No definitive solid nodular components identified. An MRI or CT scan could better evaluate. Multiple other simple cysts are identified bilaterally.   This is not etiology of ARF and can be worked up as an outpatient

## 2015-06-30 NOTE — Progress Notes (Signed)
Spoke with dr.diamond about pt with continuous restless movement, jerking, grasping at air and sitting up in bed.  Pt is restless and can not stop moving whole body. Orders given.

## 2015-06-30 NOTE — Care Management Note (Signed)
Case Management Note  Patient Details  Name: Eric Glover MRN: [id number] Date of Birth: 1945/05/27  Subjective/Objective:       Patient admitted from home with [name] and Acute metabolic encephalopathy.   Patient lives at home with his wife.  [facility], [facility], and aide.  Patient was receiving IV antibiotics at home through 06/28/15.  Patient receives his primary care at [facility].  Obtains medications at the [place], and [facility] [address].  I have notified [place] and [place] that the patient has been admitted.  I have faxed pertinent information. I was unable to locate the form that states the patient is not stable for transfer.  Per Dr. Ola Spurr patient is not stable for transfer.  He has documented this in his note.  This has also been faxed.             Action/Plan: Continue update [facility].   If patient becomes stable for transfer will need to sign consent for transfer.  [facility].    Expected Discharge Date:                  Expected Discharge Plan:     In-House Referral:     Discharge planning Services     Post Acute Care Choice:    Choice offered to:     DME Arranged:    DME Agency:     HH Arranged:    [place] Agency:     Status of Service:     Medicare Important Message Given:    Date Medicare IM Given:    Medicare IM give by:    Date Additional Medicare IM Given:    Additional Medicare Important Message give by:     If discussed at [place] of Stay Meetings, dates discussed:    Additional Comments:  Beverly Sessions, RN 06/30/2015, 4:03 PM

## 2015-06-30 NOTE — Progress Notes (Signed)
PT Cancellation Note  Patient Details Name: Eric Glover MRN: [id number] DOB: 1946-03-21   Cancelled Treatment:    Reason Eval/Treat Not Completed: Patient's level of consciousness Attempted to see pt this afternoon.  He continued to be unaware of what is going on and unable to meaningfully communicate.  Family present and states "There is no way he can do PT today"  Will check in tomorrow and attempt eval if PT is appropriate.   Kreg Shropshire 06/30/2015, 3:19 PM

## 2015-06-30 NOTE — Consult Note (Signed)
[facility] Infectious Disease     Reason for Consult Elevated LFTs, discitis    Referring Physician: Leslye Peer Date of Admission:  06/29/2015   Active Problems:   Fall   HPI: Eric Glover is a 70 y.o. 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  He was admitted and noted to be hypotensive and tachycardic but this responded to fluid bolus.  His Cr was 1.29 as otpt labs but 2.26 on admission.  CTx has been held, RUQ USS shows large gallstones but no cholecystitis. He was seen by surgery who felt LFTs were not related to the gallstones. He was transitioned to IV vancomycin and was on this until 4/6 when he got his last dose of vanco and was changed to oral levofloxacin (has only received one dose). He was having some issues with dysuria at that time but ua and ucx were negative. He was admitted 4/6 with a fall and confusion. Cr was elevated. MRI repeated and shows continued infection. He is very delirous now and has been getting benzos, opiates and benadryl Wife at bedside  Past Medical History  Diagnosis Date  . Diabetes mellitus without complication ([place])   . GERD (gastroesophageal reflux disease)   . Hyperlipidemia   . Hypertension   . CKD (chronic kidney disease), stage III   . Gout   . Staph infection 2017  . Discitis of lumbosacral region   . Discitis of lumbosacral region   . Pulmonary embolism ([place])   . Atrial fibrillation [facility])    Past Surgical History  Procedure Laterality Date  . Shoulder arthroscopy w/ rotator cuff repair    . Tee without cardioversion N/A 05/15/2015    Procedure: TRANSESOPHAGEAL ECHOCARDIOGRAM (TEE);  Surgeon: Teodoro Spray, MD;  Location: [facility] ORS;  Service: Cardiovascular;   Laterality: N/A;   Social History  Substance Use Topics  . Smoking status: Never Smoker   . Smokeless tobacco: None  . Alcohol Use: No   Family History  Problem Relation Age of Onset  . Alcohol abuse Paternal Uncle   . Early death Paternal Uncle   . Arthritis Mother   . Cancer Mother   . Diabetes Mother   . Hearing loss Mother   . Heart disease Mother   . Hyperlipidemia Mother   . Hypertension Mother   . Kidney disease Mother   . Diabetes Father   . Hearing loss Father   . Congestive Heart Failure Father     Allergies:  Allergies  Allergen Reactions  . Neomycin-Bacitracin Zn-Polymyx Rash    Current antibiotics: Antibiotics Given (last 72 hours)    Date/Time Action Medication Dose Rate   06/30/15 0101 Given   levofloxacin (LEVAQUIN) IVPB 750 mg 750 mg 100 mL/hr      MEDICATIONS: . allopurinol  100 mg Oral Daily  . amiodarone  200 mg Oral Daily  . apixaban  5 mg Oral BID  . docusate sodium  100 mg Oral BID  . doxazosin  8 mg Oral QHS  . fentaNYL  50 mcg Transdermal Q72H  . ferrous sulfate  325 mg Oral Daily  . insulin aspart  0-9 Units Subcutaneous 6 times per day  .  insulin detemir  5 Units Subcutaneous QHS  . [START ON 07/02/2015] levofloxacin (LEVAQUIN) IV  750 mg Intravenous Q48H  . metoprolol tartrate  25 mg Oral BID  . pantoprazole  40 mg Oral Daily    Review of Systems - unable to obtain   OBJECTIVE: Temp:  [98.3 F (36.8 C)-99.2 F (37.3 C)] 98.3 F (36.8 C) (04/06 2320) Pulse Rate:  [69-89] 69 (04/07 1343) Resp:  [16-20] 16 (04/07 1343) BP: (121-153)/(62-94) 150/79 mmHg (04/07 1343) SpO2:  [87 %-98 %] 97 % (04/07 1343) Weight:  [97 kg (213 lb 13.5 oz)-97.024 kg (213 lb 14.4 oz)] 97 kg (213 lb 13.5 oz) (04/07 0500) Physical Exam  Constitutional: lethargic, twitching, able to open eyes and focus for a few seconds, can tell me my name but then fades out HENT: anicteric,  Mouth/Throat: Oropharynx is very dry, has dried blood    Cardiovascular:  Normal rate, regular rhythm and normal heart sounds.   Pulmonary/Chest: Effort normal and breath sounds normal.   Abdominal: Soft. Bowel sounds are normal. He exhibits no distension. There is no tenderness.  Lymphadenopathy: He has no cervical adenopathy.  Neurological: lethargic, twitching, able to open eyes and focus for a few seconds, can tell me my name but then fades out Skin: Skin is warm and dry. No rash noted. No erythema.  Psychiatric: lethargic PICC RUE    LABS: Results for orders placed or performed during the hospital encounter of 06/29/15 (from the past 48 hour(s))  CBC with Differential     Status: Abnormal   Collection Time: 06/29/15  4:10 PM  Result Value Ref Range   WBC 9.9 3.8 - 10.6 K/uL   RBC 3.19 (L) 4.40 - 5.90 MIL/uL   Hemoglobin 8.3 (L) 13.0 - 18.0 g/dL   HCT 25.2 (L) 40.0 - 52.0 %   MCV 79.1 (L) 80.0 - 100.0 fL   MCH 26.1 26.0 - 34.0 pg   MCHC 33.1 32.0 - 36.0 g/dL   RDW 16.4 (H) 11.5 - 14.5 %   Platelets 554 (H) 150 - 440 K/uL   Neutrophils Relative % 76 %   Neutro Abs 7.6 (H) 1.4 - 6.5 K/uL   Lymphocytes Relative 11 %   Lymphs Abs 1.1 1.0 - 3.6 K/uL   Monocytes Relative 11 %   Monocytes Absolute 1.1 (H) 0.2 - 1.0 K/uL   Eosinophils Relative 1 %   Eosinophils Absolute 0.1 0 - 0.7 K/uL   Basophils Relative 1 %   Basophils Absolute 0.1 0 - 0.1 K/uL  Comprehensive metabolic panel     Status: Abnormal   Collection Time: 06/29/15  4:10 PM  Result Value Ref Range   Sodium 135 135 - 145 mmol/L   Potassium 4.7 3.5 - 5.1 mmol/L   Chloride 102 101 - 111 mmol/L   CO2 22 22 - 32 mmol/L   Glucose, Bld 143 (H) 65 - 99 mg/dL   BUN 30 (H) 6 - 20 mg/dL   Creatinine, Ser 2.56 (H) 0.61 - 1.24 mg/dL   Calcium 8.6 (L) 8.9 - 10.3 mg/dL   Total Protein 6.7 6.5 - 8.1 g/dL   Albumin 2.9 (L) 3.5 - 5.0 g/dL   AST 30 15 - 41 U/L   ALT 28 17 - 63 U/L   Alkaline Phosphatase 125 38 - 126 U/L   Total Bilirubin 0.8 0.3 - 1.2 mg/dL   GFR calc non Af Amer 24 (L) >60 mL/min    GFR calc Af Amer 28 (L) >60  mL/min    Comment: (NOTE) The eGFR has been calculated using the CKD EPI equation. This calculation has not been validated in all clinical situations. eGFR's persistently <60 mL/min signify possible Chronic Kidney Disease.    Anion gap 11 5 - 15  Blood culture (routine x 2)     Status: None (Preliminary result)   Collection Time: 06/29/15  4:10 PM  Result Value Ref Range   Specimen Description BLOOD LEFT ARM    Special Requests BOTTLES DRAWN AEROBIC AND ANAEROBIC  1CC    Culture NO GROWTH < 12 HOURS    Report Status PENDING   Lactic acid, plasma     Status: None   Collection Time: 06/29/15  4:10 PM  Result Value Ref Range   Lactic Acid, Venous 1.2 0.5 - 2.0 mmol/L  Troponin I     Status: Abnormal   Collection Time: 06/29/15  4:10 PM  Result Value Ref Range   Troponin I 0.11 (H) <0.031 ng/mL    Comment: READ BACK AND VERIFIED WITH HEATHER  FISHER AT 1827 06/29/15 SDR        PERSISTENTLY INCREASED TROPONIN VALUES IN THE RANGE OF 0.04-0.49 ng/mL CAN BE SEEN IN:       -UNSTABLE ANGINA       -CONGESTIVE HEART FAILURE       -MYOCARDITIS       -CHEST TRAUMA       -ARRYHTHMIAS       -LATE PRESENTING MYOCARDIAL INFARCTION       -COPD   CLINICAL FOLLOW-UP RECOMMENDED.   Blood culture (routine x 2)     Status: None (Preliminary result)   Collection Time: 06/29/15  4:11 PM  Result Value Ref Range   Specimen Description BLOOD RIGHT ASSIST CONTROL    Special Requests BOTTLES DRAWN AEROBIC AND ANAEROBIC  14CC    Culture NO GROWTH < 12 HOURS    Report Status PENDING   Urinalysis complete, with microscopic ([facility] only)     Status: Abnormal   Collection Time: 06/29/15  8:30 PM  Result Value Ref Range   Color, Urine AMBER (A) YELLOW   APPearance HAZY (A) CLEAR   Glucose, UA NEGATIVE NEGATIVE mg/dL   Bilirubin Urine NEGATIVE NEGATIVE   Ketones, ur TRACE (A) NEGATIVE mg/dL   Specific Gravity, Urine 1.012 1.005 - 1.030   Hgb urine dipstick 1+ (A) NEGATIVE    pH 5.0 5.0 - 8.0   Protein, ur NEGATIVE NEGATIVE mg/dL   Nitrite POSITIVE (A) NEGATIVE   Leukocytes, UA NEGATIVE NEGATIVE   RBC / HPF 6-30 0 - 5 RBC/hpf   WBC, UA NONE SEEN 0 - 5 WBC/hpf   Bacteria, UA NONE SEEN NONE SEEN   Squamous Epithelial / LPF NONE SEEN NONE SEEN   Mucous PRESENT    Amorphous Crystal PRESENT   Glucose, capillary     Status: Abnormal   Collection Time: 06/29/15 10:36 PM  Result Value Ref Range   Glucose-Capillary 133 (H) 65 - 99 mg/dL  Ammonia     Status: None   Collection Time: 06/29/15 11:04 PM  Result Value Ref Range   Ammonia 26 9 - 35 umol/L  Glucose, capillary     Status: Abnormal   Collection Time: 06/30/15 12:47 AM  Result Value Ref Range   Glucose-Capillary 189 (H) 65 - 99 mg/dL   Comment 1 Notify RN   Glucose, capillary     Status: Abnormal   Collection Time: 06/30/15  4:00 AM  Result Value Ref  Range   Glucose-Capillary 130 (H) 65 - 99 mg/dL  Basic metabolic panel     Status: Abnormal   Collection Time: 06/30/15  4:40 AM  Result Value Ref Range   Sodium 132 (L) 135 - 145 mmol/L   Potassium 4.8 3.5 - 5.1 mmol/L   Chloride 105 101 - 111 mmol/L   CO2 24 22 - 32 mmol/L   Glucose, Bld 121 (H) 65 - 99 mg/dL   BUN 28 (H) 6 - 20 mg/dL   Creatinine, Ser 2.79 (H) 0.61 - 1.24 mg/dL   Calcium 8.3 (L) 8.9 - 10.3 mg/dL   GFR calc non Af Amer 22 (L) >60 mL/min   GFR calc Af Amer 25 (L) >60 mL/min    Comment: (NOTE) The eGFR has been calculated using the CKD EPI equation. This calculation has not been validated in all clinical situations. eGFR's persistently <60 mL/min signify possible Chronic Kidney Disease.    Anion gap 3 (L) 5 - 15  CBC     Status: Abnormal   Collection Time: 06/30/15  4:40 AM  Result Value Ref Range   WBC 9.4 3.8 - 10.6 K/uL   RBC 2.87 (L) 4.40 - 5.90 MIL/uL   Hemoglobin 7.3 (L) 13.0 - 18.0 g/dL   HCT 22.5 (L) 40.0 - 52.0 %   MCV 78.5 (L) 80.0 - 100.0 fL   MCH 25.5 (L) 26.0 - 34.0 pg   MCHC 32.5 32.0 - 36.0 g/dL   RDW 16.0  (H) 11.5 - 14.5 %   Platelets 505 (H) 150 - 440 K/uL  Glucose, capillary     Status: None   Collection Time: 06/30/15  7:59 AM  Result Value Ref Range   Glucose-Capillary 79 65 - 99 mg/dL  Reticulocytes     Status: Abnormal   Collection Time: 06/30/15  1:49 PM  Result Value Ref Range   Retic Ct Pct 3.3 (H) 0.4 - 3.1 %   RBC. 2.92 (L) 4.40 - 5.90 MIL/uL   Retic Count, Manual 96.4 19.0 - 183.0 K/uL   No components found for: ESR, C REACTIVE PROTEIN MICRO: Recent Results (from the past 720 hour(s))  Blood Culture (routine x 2)     Status: None (Preliminary result)   Collection Time: 06/13/15  5:57 PM  Result Value Ref Range Status   Specimen Description BLOOD RIGHT PICC LINE  Final   Special Requests BOTTLES DRAWN AEROBIC AND ANAEROBIC 8CC  Final   Culture NO GROWTH 4 DAYS  Final   Report Status PENDING  Incomplete  Blood Culture (routine x 2)     Status: None (Preliminary result)   Collection Time: 06/13/15  5:58 PM  Result Value Ref Range Status   Specimen Description BLOOD LEFT FATTY CASTS  Final   Special Requests BOTTLES DRAWN AEROBIC AND ANAEROBIC  3CC  Final   Culture NO GROWTH 4 DAYS  Final   Report Status PENDING  Incomplete  Urine culture     Status: None   Collection Time: 06/14/15  2:34 AM  Result Value Ref Range Status   Specimen Description URINE, RANDOM  Final   Special Requests NONE  Final   Culture NO GROWTH 1 DAY  Final   Report Status 06/15/2015 FINAL  Final  Blood culture (routine x 2)     Status: None (Preliminary result)   Collection Time: 06/29/15  4:10 PM  Result Value Ref Range Status   Specimen Description BLOOD LEFT ARM  Final   Special Requests BOTTLES DRAWN  AEROBIC AND ANAEROBIC  1CC  Final   Culture NO GROWTH < 12 HOURS  Final   Report Status PENDING  Incomplete  Blood culture (routine x 2)     Status: None (Preliminary result)   Collection Time: 06/29/15  4:11 PM  Result Value Ref Range Status   Specimen Description BLOOD RIGHT ASSIST CONTROL   Final   Special Requests BOTTLES DRAWN AEROBIC AND ANAEROBIC  14CC  Final   Culture NO GROWTH < 12 HOURS  Final   Report Status PENDING  Incomplete    IMAGING: Dg Chest 2 View  06/29/2015  CLINICAL DATA:  PICC line placement.  Hypoxia. EXAM: CHEST - 2 VIEW COMPARISON:  06/13/2015 FINDINGS: Right upper extremity PICC line visible. The tip lies in the lower SVC. Lungs show new opacity at the right base consistent with atelectasis versus infiltrate. There also is some atelectasis at the left lung base. No edema or pleural fluid. The heart size and mediastinal contours are normal. IMPRESSION: PICC line tip and lower SVC. New opacity at the right lung base representing atelectasis versus infiltrate. Mild atelectasis also identified at the left lung base. Electronically Signed   By: Aletta Edouard M.D.   On: 06/29/2015 17:26   Dg Lumbar Spine 2-3 Views  06/07/2015  CLINICAL DATA:  Low back pain for 4 years. EXAM: LUMBAR SPINE - 2-3 VIEW COMPARISON:  Lumbar MRI 05/24/2015 FINDINGS: There are 5 nonrib bearing lumbar-type vertebral bodies. The vertebral body heights are maintained. There is grade 1 anterolisthesis of L5 on S1. There is 3 mm of retrolisthesis of L3 on L4. There is no acute fracture. There is degenerative disc disease and facet arthropathy at L3-4, L4-5 and L5-S1. The SI joints are unremarkable. There is abdominal aortic atherosclerosis. IMPRESSION: No acute osseous injury of the lumbar spine. Electronically Signed   By: Kathreen Devoid   On: 06/07/2015 13:05   Ct Head Wo Contrast  06/29/2015  CLINICAL DATA:  Dizziness and recent fall EXAM: CT HEAD WITHOUT CONTRAST TECHNIQUE: Contiguous axial images were obtained from the base of the skull through the vertex without intravenous contrast. COMPARISON:  05/16/2015 FINDINGS: The bony calvarium is intact. No gross soft tissue abnormality is seen. Mild atrophic changes are noted. No findings to suggest acute hemorrhage, acute infarction or space-occupying  mass lesion are noted. Basal ganglia calcifications are seen bilaterally. IMPRESSION: Mild atrophy without acute abnormality. Electronically Signed   By: Inez Catalina M.D.   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 Status post fall today. Altered mental status and weakness. Subsequent encounter. EXAM: MRI LUMBAR SPINE WITHOUT CONTRAST TECHNIQUE: Multiplanar, multisequence MR imaging of the lumbar spine was performed. No intravenous contrast was administered. COMPARISON:  MRI lumbar spine 05/24/2015. FINDINGS: Patient motion degrades the study. Since the prior examination, the intensity and extent of marrow edema about the L2-3 and L5-S1 disc interspaces has worsened. Progression appears worst in the L3, L5 and S1 vertebral bodies. There appears to be endplate irregularity and loss of vertebral body height about both the L2-3 and L5-S1 levels, worse at L5-S1. Paraspinous inflammatory change is identified. No focal fluid collection is seen on this uninfused examination. The conus medullaris is normal in signal and position. Multilevel degenerative disc disease does not appear grossly changed. IMPRESSION: Worsened appearance of discitis and osteomyelitis at L2-3 and L5-S1 since the most recent MRI. No abscess  is identified on this motion degraded study. Electronically Signed   By: Inge Rise M.D.   On: 06/30/2015 12:28   US Renal  06/30/2015  CLINICAL DATA:  Acute renal failure EXAM: RENAL / URINARY TRACT ULTRASOUND COMPLETE COMPARISON:  None. FINDINGS: Right Kidney: Length: 11 cm. A 1.3 cm cyst is associated with the upper pole of the left kidney. There is a complex cystic mass with several thin septations measuring 2.5 x 2 x 2.2 cm in the lower pole. No hydronephrosis. Left Kidney: Length: 11.9 cm. Two discrete cysts are seen with the largest in the midpole measuring 3.7 cm. Bladder: The bladder  is normal in appearance. The prostate is enlarged measuring 6 x 4.5 x 6 cm. IMPRESSION: 1. There is a complex cystic mass in the lower pole of the right kidney with multiple thin septations. No definitive solid nodular components identified. An MRI or CT scan could better evaluate. Multiple other simple cysts are identified bilaterally. 2. No cause for acute renal failure identified. These results will be called to the ordering clinician or representative by the Radiologist Assistant, and communication documented in the PACS or zVision Dashboard. Electronically Signed   By: Dorise Bullion III M.D   On: 06/30/2015 12:40   Dg Chest Port 1 View  06/13/2015  CLINICAL DATA:  Abnormal laboratory values. Possible liver or gallbladder abnormality. Initial encounter. EXAM: PORTABLE CHEST 1 VIEW COMPARISON:  Single-view of the chest 05/16/2015. FINDINGS: Right PICC is in place with the tip near the superior cavoatrial junction. Lungs are clear. No pneumothorax or pleural effusion. Heart size normal per IMPRESSION: No acute disease. Electronically Signed   By: Inge Rise M.D.   On: 06/13/2015 18:10   US Abdomen Limited Ruq  06/13/2015  CLINICAL DATA:  Elevated liver function studies, right upper quadrant abdominal pain and postprandial nausea for 07-10 days. EXAM: US ABDOMEN LIMITED - RIGHT UPPER QUADRANT COMPARISON:  12/11/2008 FINDINGS: Gallbladder: Large shadowing gallstones in the gallbladder. The largest measures approximately 4.0 x 3.0 x 0.6 cm. No gallbladder wall thickening or pericholecystic fluid. Negative sonographic Murphy sign. Common bile duct: Diameter: 5.8 mm Liver: Normal echogenicity without focal lesion or biliary dilatation. IMPRESSION: Large shadowing gallstones in the gallbladder but no sonographic findings for acute cholecystitis. Next item normal Normal Caliber common bile duct and normal liver. Electronically Signed   By: Marijo Sanes M.D.   On: 06/13/2015 18:52    Assessment:   Isabella Ida is a 70 y.o. 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  He was admitted and noted to be hypotensive and tachycardic but this responded to fluid bolus.  His Cr was 1.29 as otpt labs but 2.26 on admission.  CTx has been held, RUQ USS shows large gallstones but no cholecystitis. He was seen by surgery who felt LFTs were not related to the gallstones. He was transitioned to IV vancomycin and was on this until 4/6 when he got his last dose of vanco and was changed to oral levofloxacin (has only received one dose). He was having some issues with dysuria at that time but ua and ucx were negative. He was admitted 4/6 with a  fall and confusion. Cr was elevated. New and progressive anemia. He is very confused.  MRI repeated and shows continued infection in spine with minimal improvement . He is very delirous now and has been getting benzos, opiates and benadryl. LFTs are normalized Ammonia nml, lactate nml. UA neg Does have renal mass noted on MRI   Recommendation I think he is delirious from meds and ARF.  His wbc is nml, but I am very concerned about his new progressive anemia.   He certainly needs continued  abx and would continue levofloxacin IV I would consult heme for the anemia Discussed with wife that once stable will ask IR to aspirate the infected spine to help see if other infection present.   I was asked if he could be stable enough to transfer to [place] however I do not think at this point he is stable enough given his ARF, progressive anemia, delirium. Thank you very much for allowing me to participate in the care of this patient. Please call with questions.   Cheral Marker. Ola Spurr, MD

## 2015-06-30 NOTE — Progress Notes (Signed)
Pt's family states patient has complained about a metallic taste in his mouth. RN then assessed for tinnitus related to medication side effects, patient stated he did not have any ringing in his ears.

## 2015-06-30 NOTE — Consult Note (Signed)
Date: 06/30/2015                  Patient Name:  Eric Glover  MRN: [id number]  DOB: 03/17/1946  Age / Sex: 70 y.o., male         PCP: Ashok Norris, MD                 Service Requesting Consult: Internal medicine                 Reason for Consult: ARF            History of Present Illness: Patient is a 70 y.o. male with medical problems of DM-2, HTN, diskitis treated with iv VANC, who was admitted to [facility] on 06/29/2015 for evaluation of confusion.  Baseline Cr 0.94 (3/24) Admit Cr 2.56 He was admitted in March for similar problems. He had a MRI which showed discitis. During previous admission, his AST and ELT were also elevated. He was initially treated with ceftriaxone but that was changed to vancomycin. He was discharged on March 24. Readmitted on April 6. He had an episode of acute renal failure in February. Which resolved. Today he is significantly confused. Information is obtained from the chart and from patient's wife. He is very restless and fidgety  Medications: Outpatient medications: Prescriptions prior to admission  Medication Sig Dispense Refill Last Dose  . allopurinol (ZYLOPRIM) 100 MG tablet Take 100 mg by mouth daily.    Unknown  . amiodarone (PACERONE) 200 MG tablet Take 1 tablet (200 mg total) by mouth daily. 30 tablet 2 Unknown  . apixaban (ELIQUIS) 5 MG TABS tablet Take 1 tablet (5 mg total) by mouth 2 (two) times daily. 60 tablet 2 Unknown  . aspirin EC 81 MG tablet Take 81 mg by mouth at bedtime.    Unknown  . beclomethasone (QVAR) 80 MCG/ACT inhaler Inhale 2 puffs into the lungs 2 (two) times daily as needed (for shortness of breath).   prn  . colchicine (COLCRYS) 0.6 MG tablet Take 0.6 mg by mouth daily as needed (for gout flares).    prn  . doxazosin (CARDURA) 4 MG tablet Take 4 mg by mouth at bedtime.   Unknown  . doxazosin (CARDURA) 8 MG tablet Take 1 tablet by mouth at bedtime.   Unknown  . fentaNYL (DURAGESIC - DOSED MCG/HR) 50 MCG/HR Place 1  patch (50 mcg total) onto the skin every 3 (three) days. 10 patch 0 Unknown  . ferrous sulfate 325 (65 FE) MG tablet Take 1 tablet by mouth daily.   Unknown  . fluticasone (FLONASE) 50 MCG/ACT nasal spray Place 1-2 sprays into both nostrils daily as needed for rhinitis.   prn  . furosemide (LASIX) 20 MG tablet Take 1 tablet by mouth daily.   Unknown  . insulin aspart (NOVOLOG) 100 UNIT/ML injection Inject 4-12 Units into the skin 3 (three) times daily with meals as needed for high blood sugar. Pt uses as needed per sliding scale.   Unknown  . insulin detemir (LEVEMIR) 100 UNIT/ML injection Inject 0.2 mLs (20 Units total) into the skin at bedtime. 10 mL 11 Unknown  . ipratropium (ATROVENT) 0.03 % nasal spray Place 1 spray into both nostrils every 12 (twelve) hours.  12 Unknown  . lansoprazole (PREVACID) 15 MG capsule Take 15 mg by mouth daily.    Unknown  . levofloxacin (LEVAQUIN) 750 MG tablet Take 1 tablet by mouth daily.   Unknown  . lidocaine (LIDODERM) 5 %  Place 1 patch onto the skin daily. Remove & Discard patch within 12 hours or as directed by MD 30 patch 0 Unknown  . metoprolol tartrate (LOPRESSOR) 25 MG tablet Take 1 tablet (25 mg total) by mouth 2 (two) times daily. 60 tablet 0 Unknown  . nystatin (MYCOSTATIN) 100000 UNIT/ML suspension Take 5 mLs (500,000 Units total) by mouth 4 (four) times daily. 60 mL 0 Unknown  . oxyCODONE (OXY IR/ROXICODONE) 5 MG immediate release tablet Take 5 mg by mouth every 6 (six) hours as needed for moderate pain.    prn  . oxyCODONE-acetaminophen (PERCOCET) 5-325 MG tablet Take 1 tablet by mouth every 4 (four) hours as needed for severe pain. 30 tablet 0 prn  . phenazopyridine (PYRIDIUM) 100 MG tablet Take 1 tablet by mouth 3 (three) times daily.   Unknown  . polyethylene glycol (MIRALAX / GLYCOLAX) packet Take 17 g by mouth daily as needed for mild constipation.   prn  . propranolol ER (INDERAL LA) 80 MG 24 hr capsule Take 1 capsule by mouth daily.   Unknown   . simvastatin (ZOCOR) 80 MG tablet Take 80 mg by mouth at bedtime.    Unknown  . vitamin B-12 (CYANOCOBALAMIN) 500 MCG tablet Take 500 mcg by mouth daily.    Unknown    Current medications: Current Facility-Administered Medications  Medication Dose Route Frequency Provider Last Rate Last Dose  . acetaminophen (TYLENOL) tablet 650 mg  650 mg Oral Q6H PRN Hillary Bow, MD       Or  . acetaminophen (TYLENOL) suppository 650 mg  650 mg Rectal Q6H PRN Srikar Sudini, MD      . albuterol (PROVENTIL) (2.5 MG/3ML) 0.083% nebulizer solution 2.5 mg  2.5 mg Nebulization Q2H PRN Srikar Sudini, MD      . allopurinol (ZYLOPRIM) tablet 100 mg  100 mg Oral Daily Bettey Costa, MD   Stopped at 06/30/15 1000  . amiodarone (PACERONE) tablet 200 mg  200 mg Oral Daily Hillary Bow, MD   Stopped at 06/30/15 1000  . apixaban (ELIQUIS) tablet 5 mg  5 mg Oral BID Hillary Bow, MD   Stopped at 06/30/15 1000  . bisacodyl (DULCOLAX) suppository 10 mg  10 mg Rectal Daily PRN Srikar Sudini, MD      . budesonide (PULMICORT) nebulizer solution 0.25 mg  0.25 mg Nebulization BID PRN Srikar Sudini, MD      . dextrose 5 % solution   Intravenous Continuous Bettey Costa, MD 50 mL/hr at 06/30/15 1709    . docusate sodium (COLACE) capsule 100 mg  100 mg Oral BID Hillary Bow, MD   Stopped at 06/30/15 1000  . doxazosin (CARDURA) tablet 8 mg  8 mg Oral QHS Hillary Bow, MD   8 mg at 06/29/15 2354  . fentaNYL (DURAGESIC - dosed mcg/hr) patch 50 mcg  50 mcg Transdermal Q72H Hillary Bow, MD   50 mcg at 06/30/15 0103  . ferrous sulfate tablet 325 mg  325 mg Oral Daily Bettey Costa, MD   Stopped at 06/30/15 1130  . fluticasone (FLONASE) 50 MCG/ACT nasal spray 1-2 spray  1-2 spray Each Nare Daily PRN Srikar Sudini, MD      . HYDROcodone-acetaminophen (NORCO/VICODIN) 5-325 MG per tablet 1-2 tablet  1-2 tablet Oral Q4H PRN Srikar Sudini, MD      . insulin aspart (novoLOG) injection 0-9 Units  0-9 Units Subcutaneous 6 times per day Hillary Bow, MD   1 Units at 06/30/15 0409  . insulin detemir (LEVEMIR) injection  5 Units  5 Units Subcutaneous QHS Hillary Bow, MD   5 Units at 06/30/15 0102  . [START ON 07/02/2015] levofloxacin (LEVAQUIN) IVPB 750 mg  750 mg Intravenous Q48H Srikar Sudini, MD      . LORazepam (ATIVAN) injection 1 mg  1 mg Intravenous Q6H PRN Hillary Bow, MD   1 mg at 06/30/15 0101  . metoprolol tartrate (LOPRESSOR) tablet 25 mg  25 mg Oral BID Bettey Costa, MD   Stopped at 06/30/15 1000  . morphine 2 MG/ML injection 2 mg  2 mg Intravenous Q4H PRN Hillary Bow, MD   2 mg at 06/30/15 1342  . ondansetron (ZOFRAN) tablet 4 mg  4 mg Oral Q6H PRN Hillary Bow, MD       Or  . ondansetron (ZOFRAN) injection 4 mg  4 mg Intravenous Q6H PRN Srikar Sudini, MD      . pantoprazole (PROTONIX) EC tablet 40 mg  40 mg Oral Daily Sital Mody, MD   40 mg at 06/30/15 1230  . polyethylene glycol (MIRALAX / GLYCOLAX) packet 17 g  17 g Oral Daily PRN Srikar Sudini, MD      . sodium chloride flush (NS) 0.9 % injection 10-40 mL  10-40 mL Intracatheter PRN Hillary Bow, MD          Allergies: Allergies  Allergen Reactions  . Neomycin-Bacitracin Zn-Polymyx Rash      Past Medical History: Past Medical History  Diagnosis Date  . Diabetes mellitus without complication ([place])   . GERD (gastroesophageal reflux disease)   . Hyperlipidemia   . Hypertension   . CKD (chronic kidney disease), stage III   . Gout   . Staph infection 2017  . Discitis of lumbosacral region   . Discitis of lumbosacral region   . Pulmonary embolism ([place])   . Atrial fibrillation [facility])      Past Surgical History: Past Surgical History  Procedure Laterality Date  . Shoulder arthroscopy w/ rotator cuff repair    . Tee without cardioversion N/A 05/15/2015    Procedure: TRANSESOPHAGEAL ECHOCARDIOGRAM (TEE);  Surgeon: Teodoro Spray, MD;  Location: [facility] ORS;  Service: Cardiovascular;  Laterality: N/A;     Family History: Family History  Problem  Relation Age of Onset  . Alcohol abuse Paternal Uncle   . Early death Paternal Uncle   . Arthritis Mother   . Cancer Mother   . Diabetes Mother   . Hearing loss Mother   . Heart disease Mother   . Hyperlipidemia Mother   . Hypertension Mother   . Kidney disease Mother   . Diabetes Father   . Hearing loss Father   . Congestive Heart Failure Father      Social History: Social History   Social History  . Marital Status: Married    Spouse Name: N/A  . Number of Children: N/A  . Years of Education: N/A   Occupational History  . Not on file.   Social History Main Topics  . Smoking status: Never Smoker   . Smokeless tobacco: Not on file  . Alcohol Use: No  . Drug Use: No  . Sexual Activity: Not on file   Other Topics Concern  . Not on file   Social History Narrative     Review of Systems: Not available due to altered mental status. However Patient's wife, patient has lost about 30 pounds in the last month or so. Appetite has been poor when he is sick.  Gen:  HEENT:  CV:  Resp:  GI: GU :  MS:  Derm:   Psych: Heme:  Neuro:  Endocrine  Vital Signs: Blood pressure 150/79, pulse 69, temperature 98.3 F (36.8 C), temperature source Oral, resp. rate 16, height 6\' 2"  (1.88 m), weight 97 kg (213 lb 13.5 oz), SpO2 97 %.   Intake/Output Summary (Last 24 hours) at 06/30/15 1719 Last data filed at 06/30/15 0937  Gross per 24 hour  Intake    819 ml  Output      0 ml  Net    819 ml    Weight trends: [employer]   06/29/15 2320 06/30/15 0500  Weight: 97.024 kg (213 lb 14.4 oz) 97 kg (213 lb 13.5 oz)    Physical Exam: General:  critically ill-appearing,   HEENT  mouth is dry, anicteric   Neck:  supple    Lungs: normal effort, clear to auscultation bilaterally  Heart::  regular, no rub or gallop   Abdomen:  soft, nontender, nondistended   Extremities:  no peripheral edema   Neurologic:  opens eyes,  but not following commands at present   Skin:  Excoriation lesions on hands and legs               Lab results: Basic Metabolic Panel:  Recent Labs Lab 06/29/15 1610 06/30/15 0440  NA 135 132*  K 4.7 4.8  CL 102 105  CO2 22 24  GLUCOSE 143* 121*  BUN 30* 28*  CREATININE 2.56* 2.79*  CALCIUM 8.6* 8.3*    Liver Function Tests:  Recent Labs Lab 06/29/15 1610  AST 30  ALT 28  ALKPHOS 125  BILITOT 0.8  PROT 6.7  ALBUMIN 2.9*   No results for input(s): LIPASE, AMYLASE in the last 168 hours.  Recent Labs Lab 06/29/15 2304  AMMONIA 26    CBC:  Recent Labs Lab 06/29/15 1610 06/30/15 0440  WBC 9.9 9.4  NEUTROABS 7.6*  --   HGB 8.3* 7.3*  HCT 25.2* 22.5*  MCV 79.1* 78.5*  PLT 554* 505*    Cardiac Enzymes:  Recent Labs Lab 06/29/15 1610  TROPONINI 0.11*    BNP: Invalid input(s): POCBNP  CBG:  Recent Labs Lab 06/30/15 0047 06/30/15 0400 06/30/15 0759 06/30/15 1610 06/30/15 1635  GLUCAP 189* 130* 79 65 30*    Microbiology: Recent Results (from the past 720 hour(s))  Blood Culture (routine x 2)     Status: None (Preliminary result)   Collection Time: 06/13/15  5:57 PM  Result Value Ref Range Status   Specimen Description BLOOD RIGHT PICC LINE  Final   Special Requests BOTTLES DRAWN AEROBIC AND ANAEROBIC 8CC  Final   Culture NO GROWTH 4 DAYS  Final   Report Status PENDING  Incomplete  Blood Culture (routine x 2)     Status: None (Preliminary result)   Collection Time: 06/13/15  5:58 PM  Result Value Ref Range Status   Specimen Description BLOOD LEFT FATTY CASTS  Final   Special Requests BOTTLES DRAWN AEROBIC AND ANAEROBIC  3CC  Final   Culture NO GROWTH 4 DAYS  Final   Report Status PENDING  Incomplete  Urine culture     Status: None   Collection Time: 06/14/15  2:34 AM  Result Value Ref Range Status   Specimen Description URINE, RANDOM  Final   Special Requests NONE  Final   Culture NO GROWTH 1 DAY  Final   Report Status 06/15/2015 FINAL  Final  Blood culture (routine x 2)  Status: None (Preliminary result)   Collection Time: 06/29/15  4:10 PM  Result Value Ref Range Status   Specimen Description BLOOD LEFT ARM  Final   Special Requests BOTTLES DRAWN AEROBIC AND ANAEROBIC  1CC  Final   Culture NO GROWTH < 12 HOURS  Final   Report Status PENDING  Incomplete  Blood culture (routine x 2)     Status: None (Preliminary result)   Collection Time: 06/29/15  4:11 PM  Result Value Ref Range Status   Specimen Description BLOOD RIGHT ASSIST CONTROL  Final   Special Requests BOTTLES DRAWN AEROBIC AND ANAEROBIC  14CC  Final   Culture NO GROWTH < 12 HOURS  Final   Report Status PENDING  Incomplete     Coagulation Studies: No results for input(s): LABPROT, INR in the last 72 hours.  Urinalysis:  Recent Labs  06/29/15 2030  COLORURINE AMBER*  LABSPEC 1.012  PHURINE 5.0  GLUCOSEU NEGATIVE  HGBUR 1+*  BILIRUBINUR NEGATIVE  KETONESUR TRACE*  PROTEINUR NEGATIVE  NITRITE POSITIVE*  LEUKOCYTESUR NEGATIVE        Imaging: Dg Chest 2 View  06/29/2015  CLINICAL DATA:  PICC line placement.  Hypoxia. EXAM: CHEST - 2 VIEW COMPARISON:  06/13/2015 FINDINGS: Right upper extremity PICC line visible. The tip lies in the lower SVC. Lungs show new opacity at the right base consistent with atelectasis versus infiltrate. There also is some atelectasis at the left lung base. No edema or pleural fluid. The heart size and mediastinal contours are normal. IMPRESSION: PICC line tip and lower SVC. New opacity at the right lung base representing atelectasis versus infiltrate. Mild atelectasis also identified at the left lung base. Electronically Signed   By: Aletta Edouard M.D.   On: 06/29/2015 17:26   Ct Head Wo Contrast  06/29/2015  CLINICAL DATA:  Dizziness and recent fall EXAM: CT HEAD WITHOUT CONTRAST TECHNIQUE: Contiguous axial images were obtained from the base of the skull through the vertex without intravenous contrast. COMPARISON:  05/16/2015 FINDINGS: The bony calvarium is  intact. No gross soft tissue abnormality is seen. Mild atrophic changes are noted. No findings to suggest acute hemorrhage, acute infarction or space-occupying mass lesion are noted. Basal ganglia calcifications are seen bilaterally. IMPRESSION: Mild atrophy without acute abnormality. Electronically Signed   By: Inez Catalina M.D.   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 Status post fall today. Altered mental status and weakness. Subsequent encounter. EXAM: MRI LUMBAR SPINE WITHOUT CONTRAST TECHNIQUE: Multiplanar, multisequence MR imaging of the lumbar spine was performed. No intravenous contrast was administered. COMPARISON:  MRI lumbar spine 05/24/2015. FINDINGS: Patient motion degrades the study. Since the prior examination, the intensity and extent of marrow edema about the L2-3 and L5-S1 disc interspaces has worsened. Progression appears worst in the L3, L5 and S1 vertebral bodies. There appears to be endplate irregularity and loss of vertebral body height about both the L2-3 and L5-S1 levels, worse at L5-S1. Paraspinous inflammatory change is identified. No focal fluid collection is seen on this uninfused examination. The conus medullaris is normal in signal and position. Multilevel degenerative disc disease does not appear grossly changed. IMPRESSION: Worsened appearance of discitis and osteomyelitis at L2-3 and L5-S1 since the most recent MRI. No abscess is identified on this motion degraded study. Electronically Signed   By: Inge Rise M.D.   On: 06/30/2015 12:28  US Renal  06/30/2015  CLINICAL DATA:  Acute renal failure EXAM: RENAL / URINARY TRACT ULTRASOUND COMPLETE COMPARISON:  None. FINDINGS: Right Kidney: Length: 11 cm. A 1.3 cm cyst is associated with the upper pole of the left kidney. There is a complex cystic mass with several thin septations measuring 2.5 x 2 x 2.2 cm in the  lower pole. No hydronephrosis. Left Kidney: Length: 11.9 cm. Two discrete cysts are seen with the largest in the midpole measuring 3.7 cm. Bladder: The bladder is normal in appearance. The prostate is enlarged measuring 6 x 4.5 x 6 cm. IMPRESSION: 1. There is a complex cystic mass in the lower pole of the right kidney with multiple thin septations. No definitive solid nodular components identified. An MRI or CT scan could better evaluate. Multiple other simple cysts are identified bilaterally. 2. No cause for acute renal failure identified. These results will be called to the ordering clinician or representative by the Radiologist Assistant, and communication documented in the PACS or zVision Dashboard. Electronically Signed   By: Dorise Bullion III M.D   On: 06/30/2015 12:40      Assessment & Plan: Pt is a 70 y.o. yo male with a PMHX of DM-2, HTN, diskitis treated with iv VANC, history of acute right pulmonary embolus on 05/11/2015, was admitted on 06/29/2015 with weakness, altered mental status and acute renal failure.   1. Acute renal failure Likely secondary to ATN from concurrent infection or vancomycin toxicity Recommend checking vancomycin levels closely and follow pharmacy recommendations Will order a level for AM labs Baseline creatinine is normal at 0.94 based on labs from March 24. Admission creatinine is 2.56, today's creatinine has worsened to 2.79. Urine output is low. Patient does not have a Foley. His wife reports that he has developed urinary incontinence over the last week or so.  Patient appears volume deplete. Consider changing IV fluids from D5W to D5 normal saline which will provide some volume support. - Dose medications for creatinine clearance less than 30  2. 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 Hematuria Renal ultrasound shows 1.3 cm cyst upper pole of left kidney, complex cystic mass with thin septations in the lower pole In the left  kidney, there are 2 discrete cysts. Patient has prostate enlargement Patient will need urology opinion as outpatient at some point  4. Altered mental status Consider avoiding psychoactive medications

## 2015-06-30 NOTE — Progress Notes (Signed)
PT Cancellation Note  Patient Details Name: Donterious Batts MRN: [id number] DOB: [birth year]   Cancelled Treatment:    Reason Eval/Treat Not Completed: Patient at procedure or test/unavailable  Pt is out of room for MRI, will try back later as appropriate.  Wayne Both, PT, DPT [phone]  Kreg Shropshire 06/30/2015, 11:54 AM

## 2015-07-01 LAB — BASIC METABOLIC PANEL
Anion gap: 6 (ref 5–15)
BUN: 28 mg/dL — AB (ref 6–20)
CHLORIDE: 105 mmol/L (ref 101–111)
CO2: 24 mmol/L (ref 22–32)
Calcium: 8.5 mg/dL — ABNORMAL LOW (ref 8.9–10.3)
Creatinine, Ser: 2.6 mg/dL — ABNORMAL HIGH (ref 0.61–1.24)
GFR calc Af Amer: 27 mL/min — ABNORMAL LOW (ref >60)
GFR calc non Af Amer: 24 mL/min — ABNORMAL LOW (ref >60)
Glucose, Bld: 151 mg/dL — ABNORMAL HIGH (ref 65–99)
POTASSIUM: 4.6 mmol/L (ref 3.5–5.1)
SODIUM: 135 mmol/L (ref 135–145)

## 2015-07-01 LAB — VANCOMYCIN, RANDOM: VANCOMYCIN RM: 30 ug/mL

## 2015-07-01 LAB — CBC
HEMATOCRIT: 22 % — AB (ref 40.0–52.0)
HEMOGLOBIN: 7.3 g/dL — AB (ref 13.0–18.0)
MCH: 26.4 pg (ref 26.0–34.0)
MCHC: 33.3 g/dL (ref 32.0–36.0)
MCV: 79.3 fL — AB (ref 80.0–100.0)
Platelets: 480 10*3/uL — ABNORMAL HIGH (ref 150–440)
RBC: 2.78 MIL/uL — AB (ref 4.40–5.90)
RDW: 16.3 % — ABNORMAL HIGH (ref 11.5–14.5)
WBC: 9.5 10*3/uL (ref 3.8–10.6)

## 2015-07-01 LAB — GLUCOSE, CAPILLARY
GLUCOSE-CAPILLARY: 133 mg/dL — AB (ref 65–99)
GLUCOSE-CAPILLARY: 132 mg/dL — AB (ref 65–99)
GLUCOSE-CAPILLARY: 185 mg/dL — AB (ref 65–99)
Glucose-Capillary: 129 mg/dL — ABNORMAL HIGH (ref 65–99)
Glucose-Capillary: 138 mg/dL — ABNORMAL HIGH (ref 65–99)
Glucose-Capillary: 148 mg/dL — ABNORMAL HIGH (ref 65–99)

## 2015-07-01 MED ORDER — SODIUM CHLORIDE 0.9 % IV SOLN
INTRAVENOUS | Status: AC
  Administered 2015-07-01 – 2015-07-02 (×2): via INTRAVENOUS

## 2015-07-01 MED ORDER — FERROUS SULFATE 325 (65 FE) MG PO TABS
325.0000 mg | ORAL_TABLET | Freq: Two times a day (BID) | ORAL | Status: DC
  Administered 2015-07-01 – 2015-07-05 (×8): 325 mg via ORAL
  Filled 2015-07-01 (×8): qty 1

## 2015-07-01 NOTE — Plan of Care (Signed)
Problem: Education: Goal: Knowledge of [facility] Education information/materials will improve Outcome: Not Progressing Patient needs family assistance.  Problem: Health Behavior/Discharge Planning: Goal: Ability to manage health-related needs will improve Outcome: Not Progressing Patient needs family assistance.  Problem: Activity: Goal: Risk for activity intolerance will decrease Outcome: Not Progressing Patient has unsteady gait.  Problem: Fluid Volume: Goal: Ability to maintain a balanced intake and output will improve Outcome: Not Progressing Patient has difficulty swallowing.  Problem: Nutrition: Goal: Adequate nutrition will be maintained Outcome: Not Progressing Patient has difficulty swallowing.

## 2015-07-01 NOTE — Progress Notes (Addendum)
[place] [facility] [place] NAME: Eric Glover    MR#:  [id number]  DATE OF BIRTH:  September 06, 1945  SUBJECTIVE:   Patient was restless all night and therefore received medicine for his restlessness and gout is sleeping. Wife is at bedside. Wife reports that since he was diagnosed with discitis over 2 months ago he has slowly declined in his mental status.  REVIEW OF SYSTEMS:    Review of Systems  Constitutional: Negative for fever, chills and malaise/fatigue.  HENT: Negative for ear discharge, ear pain, hearing loss, nosebleeds, sore throat and tinnitus.  Eyes: Negative for blurred vision and pain.  Respiratory: Negative for cough, hemoptysis, sputum production, shortness of breath, wheezing and stridor.  Cardiovascular: Negative for chest pain, palpitations and leg swelling.  Gastrointestinal: Negative for nausea, vomiting, abdominal pain, diarrhea and blood in stool.  Genitourinary: Negative for dysuria.  Musculoskeletal: Negative for back pain.  Skin: Negative for rash.  Neurological: Negative for dizziness, tremors, speech change, focal weakness, seizures and headaches.  Endo/Heme/Allergies: Does not bruise/bleed easily.  Psychiatric/Behavioral: Negative for depression or anxiety.  DRUG ALLERGIES:   Allergies  Allergen Reactions  . Neomycin-Bacitracin Zn-Polymyx Rash    VITALS:  Blood pressure 124/57, pulse 80, temperature 99.2 F (37.3 C), temperature source Oral, resp. rate 18, height 6\' 2"  (1.88 m), weight 94.348 kg (208 lb), SpO2 94 %.  PHYSICAL EXAMINATION:   Physical Exam  Constitutional: He is well-developed, well-nourished, and in no distress. No distress.  HENT: dry oral mucosa. Head: Normocephalic.  Eyes: No scleral icterus.  Neck: No JVD present. No tracheal deviation present.  Cardiovascular: Normal rate, regular rhythm and normal heart sounds.  Exam reveals no gallop and no friction rub.   No murmur  heard. Pulmonary/Chest: Effort normal and breath sounds normal. No respiratory distress. He has no wheezes. He has no rales. He exhibits no tenderness.  Abdominal: Soft. Bowel sounds are normal. He exhibits no distension and no mass. There is no tenderness. There is no rebound and no guarding.  Musculoskeletal: Normal range of motion. He exhibits no edema.  Lymphadenopathy:    He has no cervical adenopathy.  Neurological: alert and oriented to person, place, and time.  But looks weak and confused. Skin: Skin is warm. No rash noted. No erythema.      LABORATORY PANEL:   CBC  Recent Labs Lab 07/01/15 0457  WBC 9.5  HGB 7.3*  HCT 22.0*  PLT 480*   ------------------------------------------------------------------------------------------------------------------  Chemistries   Recent Labs Lab 06/29/15 1610  07/01/15 0457  NA 135  < > 135  K 4.7  < > 4.6  CL 102  < > 105  CO2 22  < > 24  GLUCOSE 143*  < > 151*  BUN 30*  < > 28*  CREATININE 2.56*  < > 2.60*  CALCIUM 8.6*  < > 8.5*  AST 30  --   --   ALT 28  --   --   ALKPHOS 125  --   --   BILITOT 0.8  --   --   < > = values in this interval not displayed. ------------------------------------------------------------------------------------------------------------------  Cardiac Enzymes  Recent Labs Lab 06/29/15 1610  TROPONINI 0.11*   ------------------------------------------------------------------------------------------------------------------  RADIOLOGY:  Dg Chest 2 View  06/29/2015  CLINICAL DATA:  PICC line placement.  Hypoxia. EXAM: CHEST - 2 VIEW COMPARISON:  06/13/2015 FINDINGS: Right upper extremity PICC line visible. The tip lies in the lower SVC. Lungs show  new opacity at the right base consistent with atelectasis versus infiltrate. There also is some atelectasis at the left lung base. No edema or pleural fluid. The heart size and mediastinal contours are normal. IMPRESSION: PICC line tip and lower SVC.  New opacity at the right lung base representing atelectasis versus infiltrate. Mild atelectasis also identified at the left lung base. Electronically Signed   By: Aletta Edouard M.D.   On: 06/29/2015 17:26   Ct Head Wo Contrast  06/29/2015  CLINICAL DATA:  Dizziness and recent fall EXAM: CT HEAD WITHOUT CONTRAST TECHNIQUE: Contiguous axial images were obtained from the base of the skull through the vertex without intravenous contrast. COMPARISON:  05/16/2015 FINDINGS: The bony calvarium is intact. No gross soft tissue abnormality is seen. Mild atrophic changes are noted. No findings to suggest acute hemorrhage, acute infarction or space-occupying mass lesion are noted. Basal ganglia calcifications are seen bilaterally. IMPRESSION: Mild atrophy without acute abnormality. Electronically Signed   By: Inez Catalina M.D.   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 Status post fall today. Altered mental status and weakness. Subsequent encounter. EXAM: MRI LUMBAR SPINE WITHOUT CONTRAST TECHNIQUE: Multiplanar, multisequence MR imaging of the lumbar spine was performed. No intravenous contrast was administered. COMPARISON:  MRI lumbar spine 05/24/2015. FINDINGS: Patient motion degrades the study. Since the prior examination, the intensity and extent of marrow edema about the L2-3 and L5-S1 disc interspaces has worsened. Progression appears worst in the L3, L5 and S1 vertebral bodies. There appears to be endplate irregularity and loss of vertebral body height about both the L2-3 and L5-S1 levels, worse at L5-S1. Paraspinous inflammatory change is identified. No focal fluid collection is seen on this uninfused examination. The conus medullaris is normal in signal and position. Multilevel degenerative disc disease does not appear grossly changed. IMPRESSION: Worsened appearance of discitis and osteomyelitis at  L2-3 and L5-S1 since the most recent MRI. No abscess is identified on this motion degraded study. Electronically Signed   By: Inge Rise M.D.   On: 06/30/2015 12:28   US Renal  06/30/2015  CLINICAL DATA:  Acute renal failure EXAM: RENAL / URINARY TRACT ULTRASOUND COMPLETE COMPARISON:  None. FINDINGS: Right Kidney: Length: 11 cm. A 1.3 cm cyst is associated with the upper pole of the left kidney. There is a complex cystic mass with several thin septations measuring 2.5 x 2 x 2.2 cm in the lower pole. No hydronephrosis. Left Kidney: Length: 11.9 cm. Two discrete cysts are seen with the largest in the midpole measuring 3.7 cm. Bladder: The bladder is normal in appearance. The prostate is enlarged measuring 6 x 4.5 x 6 cm. IMPRESSION: 1. There is a complex cystic mass in the lower pole of the right kidney with multiple thin septations. No definitive solid nodular components identified. An MRI or CT scan could better evaluate. Multiple other simple cysts are identified bilaterally. 2. No cause for acute renal failure identified. These results will be called to the ordering clinician or representative by the Radiologist Assistant, and communication documented in the PACS or zVision Dashboard. 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  1. Acute renal failure: This may be due to ATN or related to vancomycin which she was on for discitis. Creatinine is better. Renal ultrasound: There is a complex cystic mass in the lower pole of the right kidney with multiple thin septations. Change to NS iv, f/u BMP.  2. Acute metabolic encephalopathy: This is likely due to acute renal failure CT  head on admission showed no acute changes. Improved, AAO times 3, but looks confused.   3. History of discitis: Patient is now on oral Levaquin. He was on vancomycin until April 4. MRI of the lumbar spine showed worsening discitis but no abscess. Negative blood cultures so far. Per Dr. Ola Spurr, would continue levofloxacin IV.  4. Diabetes: Continue sliding scale insulin, ADA diet and Lantus.  5. Essential hypertension: Continue metoprolol and when necessary hydralazine.  6. Acute on chronic anemia: Hemoglobin 7.3. Iron deficiency per anemia panel. Start iron po. Transfuse if hemoglobin greater than or equal to 7.   Management plans discussed with the patient's wife and she is in agreement.  CODE STATUS: FULL  TOTAL TIME TAKING CARE OF THIS PATIENT: 39 minutes.     POSSIBLE D/C 3-4 days, DEPENDING ON CLINICAL CONDITION.   Demetrios Loll M.D on 07/01/2015 at 2:26 PM  Between 7am to 6pm - Pager - [phone] After 6pm go to [email or web address] - password [email or web address] [facility]  Office  [phone]  CC: Primary care physician; Ashok Norris, MD  Note: This dictation was prepared with Dragon dictation along with smaller phrase technology. Any transcriptional errors that result from this process are unintentional.

## 2015-07-01 NOTE — Progress Notes (Signed)
Per family request, all side rails are up on patient's bed.  Educated family on policy that all rails up is considered a restraint.  Family aware but want to prevent patient from falling.  Bed alarm is activated.  Eric Glover  07/01/2015 9:34 PM

## 2015-07-01 NOTE — Progress Notes (Signed)
PT Cancellation Note  Patient Details Name: Eric Glover MRN: [id number] DOB: 1945-10-14   Cancelled Treatment:    Reason Eval/Treat Not Completed: Patient's level of consciousness Pt continues to be unable to stay awake or interact appropriate.  Will try back to morrow to assess appropriateness of PT intervention.   Kreg Shropshire 07/01/2015, 3:25 PM

## 2015-07-01 NOTE — Progress Notes (Signed)
[facility]  ROUNDING NOTE   Subjective:  Patient seen at bedside.  Renal function not significantly better. Still appears confused. Family at bedside.    Objective:  Vital signs in last 24 hours:  Temp:  [97.6 F (36.4 C)-99.2 F (37.3 C)] 99.2 F (37.3 C) (04/08 1157) Pulse Rate:  [78-84] 80 (04/08 1157) Resp:  [16-20] 18 (04/08 1157) BP: (124-154)/(57-64) 124/57 mmHg (04/08 1157) SpO2:  [83 %-94 %] 94 % (04/08 1157) Weight:  [94.348 kg (208 lb)] 94.348 kg (208 lb) (04/08 0500)  Weight change: -2.676 kg (-5 lb 14.4 oz) Filed Weights   06/29/15 2320 06/30/15 0500 07/01/15 0500  Weight: 97.024 kg (213 lb 14.4 oz) 97 kg (213 lb 13.5 oz) 94.348 kg (208 lb)    Intake/Output: I/O last 3 completed shifts: In: 2260 [I.V.:2260] Out: 0    Intake/Output this shift:  Total I/O In: 382 [I.V.:382] Out: -   Physical Exam: General: NAD, resting in bed  Head: Normocephalic, atraumatic. Dry oral mucosal membranes  Eyes: Anicteric  Neck: Supple, trachea midline  Lungs:  Clear to auscultation normal effort  Heart: Regular rate and rhythm  Abdomen:  Soft, nontender, BS present  Extremities: trace peripheral edema.  Neurologic: Confused, will follow simple commands  Skin: No lesions       Basic Metabolic Panel:  Recent Labs Lab 06/29/15 1610 06/30/15 0440 07/01/15 0457  NA 135 132* 135  K 4.7 4.8 4.6  CL 102 105 105  CO2 22 24 24   GLUCOSE 143* 121* 151*  BUN 30* 28* 28*  CREATININE 2.56* 2.79* 2.60*  CALCIUM 8.6* 8.3* 8.5*    Liver Function Tests:  Recent Labs Lab 06/29/15 1610  AST 30  ALT 28  ALKPHOS 125  BILITOT 0.8  PROT 6.7  ALBUMIN 2.9*   No results for input(s): LIPASE, AMYLASE in the last 168 hours.  Recent Labs Lab 06/29/15 2304  AMMONIA 26    CBC:  Recent Labs Lab 06/29/15 1610 06/30/15 0440 07/01/15 0457  WBC 9.9 9.4 9.5  NEUTROABS 7.6*  --   --   HGB 8.3* 7.3* 7.3*  HCT 25.2* 22.5* 22.0*  MCV 79.1* 78.5* 79.3*   PLT 554* 505* 480*    Cardiac Enzymes:  Recent Labs Lab 06/29/15 1610  TROPONINI 0.11*    BNP: Invalid input(s): POCBNP  CBG:  Recent Labs Lab 06/30/15 2003 07/01/15 0001 07/01/15 0452 07/01/15 0742 07/01/15 1159  GLUCAP 145* 138* 133* 148* 132*    Microbiology: Results for orders placed or performed during the hospital encounter of 06/29/15  Blood culture (routine x 2)     Status: None (Preliminary result)   Collection Time: 06/29/15  4:10 PM  Result Value Ref Range Status   Specimen Description BLOOD LEFT ARM  Final   Special Requests BOTTLES DRAWN AEROBIC AND ANAEROBIC  1CC  Final   Culture NO GROWTH 2 DAYS  Final   Report Status PENDING  Incomplete  Blood culture (routine x 2)     Status: None (Preliminary result)   Collection Time: 06/29/15  4:11 PM  Result Value Ref Range Status   Specimen Description BLOOD RIGHT ASSIST CONTROL  Final   Special Requests BOTTLES DRAWN AEROBIC AND ANAEROBIC  14CC  Final   Culture NO GROWTH 2 DAYS  Final   Report Status PENDING  Incomplete    Coagulation Studies: No results for input(s): LABPROT, INR in the last 72 hours.  Urinalysis:  Recent Labs  06/29/15 2030  [place]  LABSPEC 1.012  PHURINE 5.0  GLUCOSEU NEGATIVE  HGBUR 1+*  BILIRUBINUR NEGATIVE  KETONESUR TRACE*  PROTEINUR NEGATIVE  NITRITE POSITIVE*  LEUKOCYTESUR NEGATIVE      Imaging: Dg Chest 2 View  06/29/2015  CLINICAL DATA:  PICC line placement.  Hypoxia. EXAM: CHEST - 2 VIEW COMPARISON:  06/13/2015 FINDINGS: Right upper extremity PICC line visible. The tip lies in the lower SVC. Lungs show new opacity at the right base consistent with atelectasis versus infiltrate. There also is some atelectasis at the left lung base. No edema or pleural fluid. The heart size and mediastinal contours are normal. IMPRESSION: PICC line tip and lower SVC. New opacity at the right lung base representing atelectasis versus infiltrate. Mild atelectasis also  identified at the left lung base. Electronically Signed   By: Aletta Edouard M.D.   On: 06/29/2015 17:26   Ct Head Wo Contrast  06/29/2015  CLINICAL DATA:  Dizziness and recent fall EXAM: CT HEAD WITHOUT CONTRAST TECHNIQUE: Contiguous axial images were obtained from the base of the skull through the vertex without intravenous contrast. COMPARISON:  05/16/2015 FINDINGS: The bony calvarium is intact. No gross soft tissue abnormality is seen. Mild atrophic changes are noted. No findings to suggest acute hemorrhage, acute infarction or space-occupying mass lesion are noted. Basal ganglia calcifications are seen bilaterally. IMPRESSION: Mild atrophy without acute abnormality. Electronically Signed   By: Inez Catalina M.D.   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 Status post fall today. Altered mental status and weakness. Subsequent encounter. EXAM: MRI LUMBAR SPINE WITHOUT CONTRAST TECHNIQUE: Multiplanar, multisequence MR imaging of the lumbar spine was performed. No intravenous contrast was administered. COMPARISON:  MRI lumbar spine 05/24/2015. FINDINGS: Patient motion degrades the study. Since the prior examination, the intensity and extent of marrow edema about the L2-3 and L5-S1 disc interspaces has worsened. Progression appears worst in the L3, L5 and S1 vertebral bodies. There appears to be endplate irregularity and loss of vertebral body height about both the L2-3 and L5-S1 levels, worse at L5-S1. Paraspinous inflammatory change is identified. No focal fluid collection is seen on this uninfused examination. The conus medullaris is normal in signal and position. Multilevel degenerative disc disease does not appear grossly changed. IMPRESSION: Worsened appearance of discitis and osteomyelitis at L2-3 and L5-S1 since the most recent MRI. No abscess is identified on this motion degraded study.  Electronically Signed   By: Inge Rise M.D.   On: 06/30/2015 12:28   US Renal  06/30/2015  CLINICAL DATA:  Acute renal failure EXAM: RENAL / URINARY TRACT ULTRASOUND COMPLETE COMPARISON:  None. FINDINGS: Right Kidney: Length: 11 cm. A 1.3 cm cyst is associated with the upper pole of the left kidney. There is a complex cystic mass with several thin septations measuring 2.5 x 2 x 2.2 cm in the lower pole. No hydronephrosis. Left Kidney: Length: 11.9 cm. Two discrete cysts are seen with the largest in the midpole measuring 3.7 cm. Bladder: The bladder is normal in appearance. The prostate is enlarged measuring 6 x 4.5 x 6 cm. IMPRESSION: 1. There is a complex cystic mass in the lower pole of the right kidney with multiple thin septations. No definitive solid nodular components identified. An MRI or CT scan could better evaluate. Multiple other simple cysts are identified bilaterally. 2. No cause for acute renal failure identified. These results will be called  to the ordering clinician or representative by the Radiologist Assistant, and communication documented in the PACS or zVision Dashboard. Electronically Signed   By: Dorise Bullion III M.D   On: 06/30/2015 12:40     Medications:   . sodium chloride 75 mL/hr at 07/01/15 1224   . allopurinol  100 mg Oral Daily  . amiodarone  200 mg Oral Daily  . apixaban  5 mg Oral BID  . docusate sodium  100 mg Oral BID  . doxazosin  8 mg Oral QHS  . fentaNYL  50 mcg Transdermal Q72H  . ferrous sulfate  325 mg Oral BID WC  . insulin aspart  0-9 Units Subcutaneous 6 times per day  . insulin detemir  5 Units Subcutaneous QHS  . [START ON 07/02/2015] levofloxacin (LEVAQUIN) IV  750 mg Intravenous Q48H  . metoprolol tartrate  25 mg Oral BID  . pantoprazole  40 mg Oral Daily   acetaminophen **OR** acetaminophen, albuterol, bisacodyl, budesonide (PULMICORT) nebulizer solution, fluticasone, HYDROcodone-acetaminophen, LORazepam, morphine injection, ondansetron  **OR** ondansetron (ZOFRAN) IV, polyethylene glycol, sodium chloride flush  Assessment/ Plan:  70 y.o. male with a PMHX of DM-2, HTN, diskitis treated with iv VANC, history of acute right pulmonary embolus on 05/11/2015, was admitted on 06/29/2015 with weakness, altered mental status and acute renal failure.   1. Acute renal failure Likely secondary to ATN from concurrent infection or vancomycin toxicity -Awaiting vancomycin level today, continue IVF hydration with 0.9 NS.  No urgent indication for HD at the moment.  Continue to monitor renal function trend daily.  2. 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  3. Hematuria/cystic mass right kidney: Will obtain urology consultation for cystic mass of the right kidney.    LOS: 2 Cherree Conerly 4/8/20173:23 PM

## 2015-07-01 NOTE — Evaluation (Signed)
Clinical/Bedside Swallow Evaluation Patient Details  Name: Eric Glover MRN: [id number] Date of Birth: 04-02-1945  Today's Date: 07/01/2015 Time: SLP Start Time (ACUTE ONLY): 1054 SLP Stop Time (ACUTE ONLY): 1154 SLP Time Calculation (min) (ACUTE ONLY): 60 min  Past Medical History:  Past Medical History  Diagnosis Date  . Diabetes mellitus without complication ([place])   . GERD (gastroesophageal reflux disease)   . Hyperlipidemia   . Hypertension   . CKD (chronic kidney disease), stage III   . Gout   . Staph infection 2017  . Discitis of lumbosacral region   . Discitis of lumbosacral region   . Pulmonary embolism ([place])   . Atrial fibrillation [facility])    Past Surgical History:  Past Surgical History  Procedure Laterality Date  . Shoulder arthroscopy w/ rotator cuff repair    . Tee without cardioversion N/A 05/15/2015    Procedure: TRANSESOPHAGEAL ECHOCARDIOGRAM (TEE);  Surgeon: Teodoro Spray, MD;  Location: [facility] ORS;  Service: Cardiovascular;  Laterality: N/A;   HPI:    Mr.Poteat is a 70 y.o. 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 He was advised to go to ED given labs done at [facility] by [facility] with markedly elevated LFTs with AST 500, ALT 585 alk phos 415 and T bili 2.5. His wife reports his PO intake has been poor since admisssion. She reports his mouth is very dry and he is unable to swallow. She also reports significant back pain preventing him from sitting upright for meals. Alertness varies but she is hopeful that it will improve since his pain medicine was recently changed.  Assessment / Plan / Recommendation Clinical Impression  Pt presents with immediate overt s/s of aspiration with thin liquids. It should be noted that Pt needed continuous cues to stay awake and participate with assessment. Noted jerking movments at times and fidgeting throughaout assessment. Pt  tolerated bites of applesauce and sips of honey thick liquid without s/s of aspiration and min oral difficulty. Pts tongue and mouth was very dry likely from audible mouth breathing and min PO intake. After several bites of applesauce and sips of honey thick liquid, his oral cavity looked much better. Vocal quality remained clear, laryngal elevation appeared adequate. Rec pureed diet with honey thick liquids for now. ST to further assess with trials of upgraded consistencies as overall status improves. Pts wife was very concerned about Pts back pain when being seated upright, however, he tolerated the upright positioning much better than she anticipated. She was encouraged to let Nsg position him for meals.ST to follow up with toleration of diet and adjust as needed.Only feed when alert.    Aspiration Risk  Moderate aspiration risk    Diet Recommendation   Dysphagia 1 with honey thick liquids  Medication Administration: Crushed with puree    Other  Recommendations Oral Care Recommendations: Oral care BID   Follow up Recommendations       Frequency and Duration min 3x week  1 week       Prognosis Prognosis for Safe Diet Advancement: Good Barriers to Reach Goals: Cognitive deficits      Swallow Study   General Type of Study: Bedside Swallow Evaluation Diet Prior to this Study: Regular Behavior/Cognition: Confused;Lethargic/Drowsy Oral Cavity Assessment: Dry;Dried secretions Oral Care Completed by SLP: No Oral Cavity - Dentition: Adequate natural dentition Vision: Functional for self-feeding Self-Feeding Abilities: Needs assist Patient Positioning:  Upright in chair (Wife reports sig back pain with sitting up. ) Baseline Vocal Quality: Low vocal intensity Volitional Cough: Strong Volitional Swallow: Unable to elicit    Oral/Motor/Sensory Function Overall Oral Motor/Sensory Function: Within functional limits   Ice Chips Ice chips: Within functional limits   Thin Liquid Thin Liquid:  Impaired Presentation: Spoon Oral Phase Impairments: Reduced labial seal;Reduced lingual movement/coordination Pharyngeal  Phase Impairments: Cough - Immediate    Nectar Thick Nectar Thick Liquid: Not tested   Honey Thick Honey Thick Liquid: Within functional limits Presentation: Cup   Puree Puree: Within functional limits   Solid   GO   Solid: Impaired Presentation: Self Fed Oral Phase Impairments: Impaired mastication Oral Phase Functional Implications: Impaired mastication        Lucila Maine 07/01/2015,11:56 AM

## 2015-07-02 DIAGNOSIS — N183 Chronic kidney disease, stage 3 (moderate): Secondary | ICD-10-CM

## 2015-07-02 DIAGNOSIS — R410 Disorientation, unspecified: Secondary | ICD-10-CM

## 2015-07-02 DIAGNOSIS — E785 Hyperlipidemia, unspecified: Secondary | ICD-10-CM

## 2015-07-02 DIAGNOSIS — Z86711 Personal history of pulmonary embolism: Secondary | ICD-10-CM

## 2015-07-02 DIAGNOSIS — R5383 Other fatigue: Secondary | ICD-10-CM

## 2015-07-02 DIAGNOSIS — Z7901 Long term (current) use of anticoagulants: Secondary | ICD-10-CM

## 2015-07-02 DIAGNOSIS — I4891 Unspecified atrial fibrillation: Secondary | ICD-10-CM

## 2015-07-02 DIAGNOSIS — N289 Disorder of kidney and ureter, unspecified: Secondary | ICD-10-CM

## 2015-07-02 DIAGNOSIS — D649 Anemia, unspecified: Secondary | ICD-10-CM

## 2015-07-02 DIAGNOSIS — R531 Weakness: Secondary | ICD-10-CM

## 2015-07-02 DIAGNOSIS — E119 Type 2 diabetes mellitus without complications: Secondary | ICD-10-CM

## 2015-07-02 DIAGNOSIS — I10 Essential (primary) hypertension: Secondary | ICD-10-CM

## 2015-07-02 DIAGNOSIS — N179 Acute kidney failure, unspecified: Secondary | ICD-10-CM

## 2015-07-02 DIAGNOSIS — Z79899 Other long term (current) drug therapy: Secondary | ICD-10-CM

## 2015-07-02 DIAGNOSIS — M4640 Discitis, unspecified, site unspecified: Secondary | ICD-10-CM

## 2015-07-02 DIAGNOSIS — I129 Hypertensive chronic kidney disease with stage 1 through stage 4 chronic kidney disease, or unspecified chronic kidney disease: Secondary | ICD-10-CM

## 2015-07-02 LAB — CBC
HEMATOCRIT: 20.7 % — AB (ref 40.0–52.0)
Hemoglobin: 6.8 g/dL — ABNORMAL LOW (ref 13.0–18.0)
MCH: 25.7 pg — AB (ref 26.0–34.0)
MCHC: 32.7 g/dL (ref 32.0–36.0)
MCV: 78.7 fL — AB (ref 80.0–100.0)
Platelets: 431 10*3/uL (ref 150–440)
RBC: 2.63 MIL/uL — ABNORMAL LOW (ref 4.40–5.90)
RDW: 16.4 % — ABNORMAL HIGH (ref 11.5–14.5)
WBC: 7.8 10*3/uL (ref 3.8–10.6)

## 2015-07-02 LAB — PREPARE RBC (CROSSMATCH)

## 2015-07-02 LAB — BASIC METABOLIC PANEL
Anion gap: 4 — ABNORMAL LOW (ref 5–15)
BUN: 28 mg/dL — AB (ref 6–20)
CHLORIDE: 108 mmol/L (ref 101–111)
CO2: 25 mmol/L (ref 22–32)
CREATININE: 2.47 mg/dL — AB (ref 0.61–1.24)
Calcium: 8.1 mg/dL — ABNORMAL LOW (ref 8.9–10.3)
GFR calc Af Amer: 29 mL/min — ABNORMAL LOW (ref >60)
GFR calc non Af Amer: 25 mL/min — ABNORMAL LOW (ref >60)
GLUCOSE: 106 mg/dL — AB (ref 65–99)
POTASSIUM: 4.1 mmol/L (ref 3.5–5.1)
SODIUM: 137 mmol/L (ref 135–145)

## 2015-07-02 LAB — GLUCOSE, CAPILLARY
GLUCOSE-CAPILLARY: 104 mg/dL — AB (ref 65–99)
GLUCOSE-CAPILLARY: 112 mg/dL — AB (ref 65–99)
GLUCOSE-CAPILLARY: 169 mg/dL — AB (ref 65–99)
GLUCOSE-CAPILLARY: 172 mg/dL — AB (ref 65–99)
GLUCOSE-CAPILLARY: 185 mg/dL — AB (ref 65–99)
Glucose-Capillary: 120 mg/dL — ABNORMAL HIGH (ref 65–99)
Glucose-Capillary: 146 mg/dL — ABNORMAL HIGH (ref 65–99)

## 2015-07-02 LAB — ABO/RH: ABO/RH(D): O NEG

## 2015-07-02 LAB — APTT: aPTT: 52 seconds — ABNORMAL HIGH (ref 24–36)

## 2015-07-02 LAB — PROTIME-INR
INR: 1.97
Prothrombin Time: 22.3 seconds — ABNORMAL HIGH (ref 11.4–15.0)

## 2015-07-02 MED ORDER — SODIUM CHLORIDE 0.9 % IV SOLN
Freq: Once | INTRAVENOUS | Status: AC
  Administered 2015-07-02: 07:00:00 via INTRAVENOUS

## 2015-07-02 MED ORDER — SODIUM CHLORIDE 0.9 % IV SOLN
INTRAVENOUS | Status: AC
  Administered 2015-07-02 – 2015-07-03 (×2): via INTRAVENOUS

## 2015-07-02 NOTE — Consult Note (Signed)
[place]  [phone] [phone]  ID: Enid Baas OB: [birth year]  MR#: [id number]  [id number]  Patient Care Team: Ashok Norris, MD as PCP - General (Family Medicine)  CHIEF COMPLAINT:  Chief Complaint  Patient presents with  . Fall  . Dizziness    INTERVAL HISTORY: Patient is a 70 year old male with multiple medical problems who was recently admitted to hospital with increasing confusion, weakness and found to have acute renal failure possibly secondary to ATN. Patient has been receiving vancomycin since April 4 secondary to bacteremia with Streptococcus mutans discitis. Upon further evaluation, patient was found to be significantly anemic with decreased iron stores. He continues to have mild confusion and much of the history is given by his wife. He has no new neurologic complaints. She does not report any recent fevers. He has no chest pain or shortness of breath. His appetite and dysphagia are improving. She reports no other complaints.  REVIEW OF SYSTEMS:   Review of Systems  Constitutional: Positive for malaise/fatigue. Negative for fever and weight loss.  Respiratory: Negative.  Negative for cough and shortness of breath.   Cardiovascular: Negative.  Negative for chest pain.  Gastrointestinal: Positive for nausea.  Genitourinary: Negative.   Musculoskeletal: Positive for back pain.  Neurological: Positive for weakness.    As per HPI. Otherwise, a complete review of systems is negatve.  PAST MEDICAL HISTORY: Past Medical History  Diagnosis Date  . Diabetes mellitus without complication ([place])   . GERD (gastroesophageal reflux disease)   . Hyperlipidemia   . Hypertension   . CKD (chronic kidney disease), stage III   . Gout   . Staph infection 2017  . Discitis of lumbosacral region   . Discitis of lumbosacral region   . Pulmonary embolism ([place])   . Atrial fibrillation ([place])     PAST SURGICAL HISTORY: Past  Surgical History  Procedure Laterality Date  . Shoulder arthroscopy w/ rotator cuff repair    . Tee without cardioversion N/A 05/15/2015    Procedure: TRANSESOPHAGEAL ECHOCARDIOGRAM (TEE);  Surgeon: Teodoro Spray, MD;  Location: [facility] ORS;  Service: Cardiovascular;  Laterality: N/A;    FAMILY HISTORY Family History  Problem Relation Age of Onset  . Alcohol abuse Paternal Uncle   . Early death Paternal Uncle   . Arthritis Mother   . Cancer Mother   . Diabetes Mother   . Hearing loss Mother   . Heart disease Mother   . Hyperlipidemia Mother   . Hypertension Mother   . Kidney disease Mother   . Diabetes Father   . Hearing loss Father   . Congestive Heart Failure Father        ADVANCED DIRECTIVES:    HEALTH MAINTENANCE: Social History  Substance Use Topics  . Smoking status: Never Smoker   . Smokeless tobacco: None  . Alcohol Use: No     Colonoscopy:  PAP:  Bone density:  Lipid panel:  Allergies  Allergen Reactions  . Neomycin-Bacitracin Zn-Polymyx Rash    Current Facility-Administered Medications  Medication Dose Route Frequency Provider Last Rate Last Dose  . 0.9 %  sodium chloride infusion   Intravenous Continuous Demetrios Loll, MD 75 mL/hr at 07/02/15 0204    . acetaminophen (TYLENOL) tablet 650 mg  650 mg Oral Q6H PRN Hillary Bow, MD       Or  . acetaminophen (TYLENOL) suppository 650 mg  650 mg Rectal Q6H PRN Hillary Bow, MD      . albuterol (PROVENTIL) (2.5  MG/3ML) 0.083% nebulizer solution 2.5 mg  2.5 mg Nebulization Q2H PRN Srikar Sudini, MD      . allopurinol (ZYLOPRIM) tablet 100 mg  100 mg Oral Daily Bettey Costa, MD   100 mg at 07/01/15 1202  . amiodarone (PACERONE) tablet 200 mg  200 mg Oral Daily Hillary Bow, MD   200 mg at 07/01/15 1202  . apixaban (ELIQUIS) tablet 5 mg  5 mg Oral BID Hillary Bow, MD   5 mg at 07/01/15 2152  . bisacodyl (DULCOLAX) suppository 10 mg  10 mg Rectal Daily PRN Srikar Sudini, MD      . budesonide (PULMICORT) nebulizer  solution 0.25 mg  0.25 mg Nebulization BID PRN Srikar Sudini, MD      . docusate sodium (COLACE) capsule 100 mg  100 mg Oral BID Hillary Bow, MD   100 mg at 07/01/15 2151  . doxazosin (CARDURA) tablet 8 mg  8 mg Oral QHS Hillary Bow, MD   8 mg at 07/01/15 2151  . fentaNYL (DURAGESIC - dosed mcg/hr) patch 50 mcg  50 mcg Transdermal Q72H Hillary Bow, MD   50 mcg at 06/30/15 0103  . ferrous sulfate tablet 325 mg  325 mg Oral BID WC Demetrios Loll, MD   325 mg at 07/02/15 0826  . fluticasone (FLONASE) 50 MCG/ACT nasal spray 1-2 spray  1-2 spray Each Nare Daily PRN Srikar Sudini, MD      . HYDROcodone-acetaminophen (NORCO/VICODIN) 5-325 MG per tablet 1-2 tablet  1-2 tablet Oral Q4H PRN Srikar Sudini, MD      . insulin aspart (novoLOG) injection 0-9 Units  0-9 Units Subcutaneous 6 times per day Hillary Bow, MD   2 Units at 07/02/15 0038  . insulin detemir (LEVEMIR) injection 5 Units  5 Units Subcutaneous QHS Hillary Bow, MD   5 Units at 07/01/15 2151  . levofloxacin (LEVAQUIN) IVPB 750 mg  750 mg Intravenous Q48H Srikar Sudini, MD      . LORazepam (ATIVAN) injection 1 mg  1 mg Intravenous Q6H PRN Hillary Bow, MD   1 mg at 07/02/15 0407  . metoprolol tartrate (LOPRESSOR) tablet 25 mg  25 mg Oral BID Bettey Costa, MD   25 mg at 07/01/15 2152  . morphine 2 MG/ML injection 2 mg  2 mg Intravenous Q4H PRN Hillary Bow, MD   2 mg at 06/30/15 1742  . ondansetron (ZOFRAN) tablet 4 mg  4 mg Oral Q6H PRN Hillary Bow, MD       Or  . ondansetron (ZOFRAN) injection 4 mg  4 mg Intravenous Q6H PRN Hillary Bow, MD   4 mg at 06/30/15 1742  . pantoprazole (PROTONIX) EC tablet 40 mg  40 mg Oral Daily Bettey Costa, MD   40 mg at 06/30/15 1230  . polyethylene glycol (MIRALAX / GLYCOLAX) packet 17 g  17 g Oral Daily PRN Srikar Sudini, MD      . sodium chloride flush (NS) 0.9 % injection 10-40 mL  10-40 mL Intracatheter PRN Hillary Bow, MD        OBJECTIVE: Filed Vitals:   07/01/15 2140 07/02/15 0558  BP: 139/88  119/97  Pulse: 76 72  Temp: 98.3 F (36.8 C) 98.8 F (37.1 C)  Resp: 18 22     Body mass index is 27.49 kg/(m^2).    ECOG FS:3 - Symptomatic, >50% confined to bed  General: Well-developed, well-nourished, no acute distress. Eyes: Pink conjunctiva, anicteric sclera. HEENT: Normocephalic, moist mucous membranes, clear oropharnyx. Lungs: Clear to auscultation bilaterally.  Heart: Regular rate and rhythm. No rubs, murmurs, or gallops. Abdomen: Soft, nontender, nondistended. No organomegaly noted, normoactive bowel sounds. Musculoskeletal: No edema, cyanosis, or clubbing. Neuro: Alert, answering all questions appropriately. Cranial nerves grossly intact. Skin: No rashes or petechiae noted. Psych: Normal affect. Lymphatics: No cervical, calvicular, axillary or inguinal LAD.   LAB RESULTS:  Lab Results  Component Value Date   NA 137 07/02/2015   K 4.1 07/02/2015   CL 108 07/02/2015   CO2 25 07/02/2015   GLUCOSE 106* 07/02/2015   BUN 28* 07/02/2015   CREATININE 2.47* 07/02/2015   CALCIUM 8.1* 07/02/2015   PROT 6.7 06/29/2015   ALBUMIN 2.9* 06/29/2015   AST 30 06/29/2015   ALT 28 06/29/2015   ALKPHOS 125 06/29/2015   BILITOT 0.8 06/29/2015   GFRNONAA 25* 07/02/2015   GFRAA 29* 07/02/2015    Lab Results  Component Value Date   WBC 7.8 07/02/2015   NEUTROABS 7.6* 06/29/2015   HGB 6.8* 07/02/2015   HCT 20.7* 07/02/2015   MCV 78.7* 07/02/2015   PLT 431 07/02/2015     STUDIES: Dg Chest 2 View  06/29/2015  CLINICAL DATA:  PICC line placement.  Hypoxia. EXAM: CHEST - 2 VIEW COMPARISON:  06/13/2015 FINDINGS: Right upper extremity PICC line visible. The tip lies in the lower SVC. Lungs show new opacity at the right base consistent with atelectasis versus infiltrate. There also is some atelectasis at the left lung base. No edema or pleural fluid. The heart size and mediastinal contours are normal. IMPRESSION: PICC line tip and lower SVC. New opacity at the right lung base  representing atelectasis versus infiltrate. Mild atelectasis also identified at the left lung base. Electronically Signed   By: Aletta Edouard M.D.   On: 06/29/2015 17:26   Dg Lumbar Spine 2-3 Views  06/07/2015  CLINICAL DATA:  Low back pain for 4 years. EXAM: LUMBAR SPINE - 2-3 VIEW COMPARISON:  Lumbar MRI 05/24/2015 FINDINGS: There are 5 nonrib bearing lumbar-type vertebral bodies. The vertebral body heights are maintained. There is grade 1 anterolisthesis of L5 on S1. There is 3 mm of retrolisthesis of L3 on L4. There is no acute fracture. There is degenerative disc disease and facet arthropathy at L3-4, L4-5 and L5-S1. The SI joints are unremarkable. There is abdominal aortic atherosclerosis. IMPRESSION: No acute osseous injury of the lumbar spine. Electronically Signed   By: Kathreen Devoid   On: 06/07/2015 13:05   Ct Head Wo Contrast  06/29/2015  CLINICAL DATA:  Dizziness and recent fall EXAM: CT HEAD WITHOUT CONTRAST TECHNIQUE: Contiguous axial images were obtained from the base of the skull through the vertex without intravenous contrast. COMPARISON:  05/16/2015 FINDINGS: The bony calvarium is intact. No gross soft tissue abnormality is seen. Mild atrophic changes are noted. No findings to suggest acute hemorrhage, acute infarction or space-occupying mass lesion are noted. Basal ganglia calcifications are seen bilaterally. IMPRESSION: Mild atrophy without acute abnormality. Electronically Signed   By: Inez Catalina M.D.   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 Status post fall today. Altered mental status and weakness. Subsequent encounter. EXAM: MRI LUMBAR SPINE WITHOUT CONTRAST TECHNIQUE: Multiplanar, multisequence MR imaging of the lumbar spine was performed. No intravenous contrast was administered. COMPARISON:  MRI lumbar spine 05/24/2015. FINDINGS: Patient motion degrades the study.  Since the prior examination, the intensity and extent of marrow edema about  the L2-3 and L5-S1 disc interspaces has worsened. Progression appears worst in the L3, L5 and S1 vertebral bodies. There appears to be endplate irregularity and loss of vertebral body height about both the L2-3 and L5-S1 levels, worse at L5-S1. Paraspinous inflammatory change is identified. No focal fluid collection is seen on this uninfused examination. The conus medullaris is normal in signal and position. Multilevel degenerative disc disease does not appear grossly changed. IMPRESSION: Worsened appearance of discitis and osteomyelitis at L2-3 and L5-S1 since the most recent MRI. No abscess is identified on this motion degraded study. Electronically Signed   By: Inge Rise M.D.   On: 06/30/2015 12:28   US Renal  06/30/2015  CLINICAL DATA:  Acute renal failure EXAM: RENAL / URINARY TRACT ULTRASOUND COMPLETE COMPARISON:  None. FINDINGS: Right Kidney: Length: 11 cm. A 1.3 cm cyst is associated with the upper pole of the left kidney. There is a complex cystic mass with several thin septations measuring 2.5 x 2 x 2.2 cm in the lower pole. No hydronephrosis. Left Kidney: Length: 11.9 cm. Two discrete cysts are seen with the largest in the midpole measuring 3.7 cm. Bladder: The bladder is normal in appearance. The prostate is enlarged measuring 6 x 4.5 x 6 cm. IMPRESSION: 1. There is a complex cystic mass in the lower pole of the right kidney with multiple thin septations. No definitive solid nodular components identified. An MRI or CT scan could better evaluate. Multiple other simple cysts are identified bilaterally. 2. No cause for acute renal failure identified. These results will be called to the ordering clinician or representative by the Radiologist Assistant, and communication documented in the PACS or zVision Dashboard. Electronically Signed   By: Dorise Bullion III M.D   On: 06/30/2015 12:40   Dg Chest Port 1 View  06/13/2015   CLINICAL DATA:  Abnormal laboratory values. Possible liver or gallbladder abnormality. Initial encounter. EXAM: PORTABLE CHEST 1 VIEW COMPARISON:  Single-view of the chest 05/16/2015. FINDINGS: Right PICC is in place with the tip near the superior cavoatrial junction. Lungs are clear. No pneumothorax or pleural effusion. Heart size normal per IMPRESSION: No acute disease. Electronically Signed   By: Inge Rise M.D.   On: 06/13/2015 18:10   US Abdomen Limited Ruq  06/13/2015  CLINICAL DATA:  Elevated liver function studies, right upper quadrant abdominal pain and postprandial nausea for 07-10 days. EXAM: US ABDOMEN LIMITED - RIGHT UPPER QUADRANT COMPARISON:  12/11/2008 FINDINGS: Gallbladder: Large shadowing gallstones in the gallbladder. The largest measures approximately 4.0 x 3.0 x 0.6 cm. No gallbladder wall thickening or pericholecystic fluid. Negative sonographic Murphy sign. Common bile duct: Diameter: 5.8 mm Liver: Normal echogenicity without focal lesion or biliary dilatation. IMPRESSION: Large shadowing gallstones in the gallbladder but no sonographic findings for acute cholecystitis. Next item normal Normal Caliber common bile duct and normal liver. Electronically Signed   By: Marijo Sanes M.D.   On: 06/13/2015 18:52    ASSESSMENT: Acute renal failure, anemia, discitis, suspicious right kidney mass.  PLAN:    1. Acute renal failure: Likely secondary ATN from concurrent infection and possibly vancomycin toxicity. No indication for hemodialysis at this point. Appreciate nephrology input. 2. Anemia: Likely multifactorial given poor appetite, renal failure, bacteremia, and iron deficiency. Patient is receiving 1 unit of packed red blood cells today. Continue to monitor daily CBC. Patient may also benefit from IV Feraheme in the future. Will do a full anemia workup as an outpatient once acute symptoms resolve. 3.  Discitis: Continue current medications as prescribed. 4. Suspicious kidney  mass: Urology consult pending. Patient will likely require CT or MRI for further evaluation.  Appreciate consult, will follow.   Lloyd Huger, MD   07/02/2015 8:27 AM

## 2015-07-02 NOTE — Consult Note (Signed)
Reason for Consult: Small Right Renal Cystic Mass, Acute on Chronic Renal Insufficiency, Prostate Screening  Referring Physician: Munsoor Lateef MD  Eric Glover is an 70 y.o. male.   HPI:   1 - Small Right Renal Cystic Mass - incidental moderately complex right lower pole 2.5cm mass by [place] incidental on eval renal insufficiency 06/2015. Also several <2cm bilateral non-complex renal cysts. No mass effect. He has significant comorbidity.  2 -  Acute on Chronic Renal Insufficiency - pt with baseline Cr <1.5 per report, now 1.8-2.5 x several mos 2017. He has been on chronic ABX including vanc for spinal osteomyelitis. No hyperkalemia, anuria, or volume overload. [place] 06/2015 w/o hydro.  3 -  Prostate Screening - no FHX prostate cancer. DRE 06/2015 45gm smooth. No recent PSA for review. He is nearly 60 with significant comorbidity.  Today "Eric Glover" is seen in consultation for above, specificialy for opnionon his small indcidetnal renal cyst. He is currently hospitalized with LE weakness / falls from recurrent spinal inflammation / osteomyelitis.   Past Medical History  Diagnosis Date  . Diabetes mellitus without complication ([place])   . GERD (gastroesophageal reflux disease)   . Hyperlipidemia   . Hypertension   . CKD (chronic kidney disease), stage III   . Gout   . Staph infection 2017  . Discitis of lumbosacral region   . Discitis of lumbosacral region   . Pulmonary embolism ([place])   . Atrial fibrillation [facility])     Past Surgical History  Procedure Laterality Date  . Shoulder arthroscopy w/ rotator cuff repair    . Tee without cardioversion N/A 05/15/2015    Procedure: TRANSESOPHAGEAL ECHOCARDIOGRAM (TEE);  Surgeon: Teodoro Spray, MD;  Location: [facility] ORS;  Service: Cardiovascular;  Laterality: N/A;    Family History  Problem Relation Age of Onset  . Alcohol abuse Paternal Uncle   . Early death Paternal Uncle   . Arthritis Mother   . Cancer Mother   . Diabetes Mother   . Hearing  loss Mother   . Heart disease Mother   . Hyperlipidemia Mother   . Hypertension Mother   . Kidney disease Mother   . Diabetes Father   . Hearing loss Father   . Congestive Heart Failure Father     Social History:  reports that he has never smoked. He does not have any smokeless tobacco history on file. He reports that he does not drink alcohol or use illicit drugs.  Allergies:  Allergies  Allergen Reactions  . Neomycin-Bacitracin Zn-Polymyx Rash    Medications: I have reviewed the patient's current medications.  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 Status post fall today. Altered mental status and weakness. Subsequent encounter. EXAM: MRI LUMBAR SPINE WITHOUT CONTRAST TECHNIQUE: Multiplanar, multisequence MR imaging of the lumbar spine was performed. No intravenous contrast was administered. COMPARISON:  MRI lumbar spine 05/24/2015. FINDINGS: Patient motion degrades the study. Since the prior examination, the intensity and extent of marrow edema about the L2-3 and L5-S1 disc interspaces has worsened. Progression appears worst in the L3, L5 and S1 vertebral bodies. There appears to be endplate irregularity and loss of vertebral body height about both the L2-3 and L5-S1 levels, worse at L5-S1. Paraspinous inflammatory change is identified. No focal fluid collection is seen on this uninfused examination. The conus medullaris is normal in signal and position. Multilevel degenerative disc disease does not appear grossly changed. IMPRESSION:  Worsened appearance of discitis and osteomyelitis at L2-3 and L5-S1 since the most recent MRI. No abscess is identified on this motion degraded study. Electronically Signed   By: Inge Rise M.D.   On: 06/30/2015 12:28   US Renal  06/30/2015  CLINICAL DATA:  Acute renal failure EXAM: RENAL / URINARY TRACT ULTRASOUND COMPLETE COMPARISON:  None. FINDINGS: Right  Kidney: Length: 11 cm. A 1.3 cm cyst is associated with the upper pole of the left kidney. There is a complex cystic mass with several thin septations measuring 2.5 x 2 x 2.2 cm in the lower pole. No hydronephrosis. Left Kidney: Length: 11.9 cm. Two discrete cysts are seen with the largest in the midpole measuring 3.7 cm. Bladder: The bladder is normal in appearance. The prostate is enlarged measuring 6 x 4.5 x 6 cm. IMPRESSION: 1. There is a complex cystic mass in the lower pole of the right kidney with multiple thin septations. No definitive solid nodular components identified. An MRI or CT scan could better evaluate. Multiple other simple cysts are identified bilaterally. 2. No cause for acute renal failure identified. These results will be called to the ordering clinician or representative by the Radiologist Assistant, and communication documented in the PACS or zVision Dashboard. Electronically Signed   By: Dorise Bullion III M.D   On: 06/30/2015 12:40    Review of Systems  Constitutional: Positive for malaise/fatigue. Negative for fever and chills.  Eyes: Negative.   Respiratory: Negative.   Cardiovascular: Negative.   Gastrointestinal: Negative.   Genitourinary: Negative for dysuria, urgency, frequency, hematuria and flank pain.  Musculoskeletal: Positive for falls.  Skin: Negative.   Neurological: Positive for focal weakness.  Endo/Heme/Allergies: Negative.   Psychiatric/Behavioral: Negative.    Blood pressure 148/59, pulse 79, temperature 98 F (36.7 C), temperature source Axillary, resp. rate 18, height 6\' 2"  (1.88 m), weight 97.16 kg (214 lb 3.2 oz), SpO2 95 %. Physical Exam  Constitutional: He appears well-developed.  HENT:  Head: Normocephalic.  Wears glasses  Cardiovascular: Normal rate.   Respiratory: Effort normal.  GI: Soft.  Old scar epigastrtum w/o hernias.   Genitourinary: Prostate normal and penis normal.  Musculoskeletal: Normal range of motion.  Neurological: He  is alert.  Skin: Skin is warm.  Psychiatric: He has a normal mood and affect. His behavior is normal. Thought content normal.    Assessment/Plan:   1 - Small Right Renal Cystic Mass - discussed DDX cystic neoplasm (usually slow growing / indolent) v. Non-neoplastic Cyst. He has significant comorbidity and renal insufficiency. Rec non-con abd MRI while in house then yearly surveillance with consideration of therapy if mass becomes significantly larger (3.5cm or more) more pending clinical situation. He would have significant risk of progression to dialysis with any surgical intervention.   I will order MRI and request outpatient f/u in elective setting at [place] office.   2 -  Acute on Chronic Renal Insufficiency - no hydro or symptoms c/w obstructive etiology. Agree with conservative measures outlined by Dr. Holley Raring.   3 -  Prostate Screening - DRE normal. NO role for PSA screening at his age and level of comorbidity.  4 - Will follow prn while in house, please call me directly with any questions.     Mirel Hundal 07/02/2015, 10:05 AM

## 2015-07-02 NOTE — Progress Notes (Signed)
PT Cancellation Note  Patient Details Name: Eric Glover MRN: [id number] DOB: 1945-05-24   Cancelled Treatment:    Reason Eval/Treat Not Completed: Patient's level of consciousness Through out discussion with family pt does not open eyes or acknowledge anyone's presence but does have constant twitching and figiting the entire time.  They report that he does have brief windows where he is somewhat awake and alert.  Family against states he is not ready to try today but very much hoping to get pt doing something tomorrow - possibly plan to check in mid day, if not then maybe around 4PM when he seems to be more typically "alert."  Wayne Both, PT, DPT [phone]  Kreg Shropshire 07/02/2015, 1:54 PM

## 2015-07-02 NOTE — Progress Notes (Signed)
[facility] NAME: Eric Glover    MR#:  [id number]  DATE OF BIRTH:  January 12, 1946  SUBJECTIVE:   Patient was agitated last night and treated with Ativan. He was sleeping and unresponsive to verbal stimuli.  REVIEW OF SYSTEMS:    Unable to get a ROS.  DRUG ALLERGIES:   Allergies  Allergen Reactions  . Neomycin-Bacitracin Zn-Polymyx Rash    VITALS:  Blood pressure 145/59, pulse 68, temperature 98.1 F (36.7 C), temperature source Axillary, resp. rate 18, height 6\' 2"  (1.88 m), weight 97.16 kg (214 lb 3.2 oz), SpO2 95 %.  PHYSICAL EXAMINATION:   Physical Exam  Constitutional: He is well-developed, well-nourished, and in no distress. Sleeping. HENT: dry oral mucosa. Head: Normocephalic.  Eyes: No scleral icterus.  Neck: No JVD present. No tracheal deviation present.  Cardiovascular: Normal rate, regular rhythm and normal heart sounds.  Exam reveals no gallop and no friction rub.   No murmur heard. Pulmonary/Chest: Effort normal and breath sounds normal. No respiratory distress. He has no wheezes. He has no rales. He exhibits no tenderness.  Abdominal: Soft. Bowel sounds are normal. He exhibits no distension and no mass. There is no tenderness. There is no rebound and no guarding.  Musculoskeletal: Normal range of motion. He exhibits no edema.  Lymphadenopathy:    He has no cervical adenopathy.  Neurological: he is sleeping, unresponsive to verbal stimuli, unable to exam. Skin: Skin is warm. No rash noted. No erythema.      LABORATORY PANEL:   CBC  Recent Labs Lab 07/02/15 0425  WBC 7.8  HGB 6.8*  HCT 20.7*  PLT 431   ------------------------------------------------------------------------------------------------------------------  Chemistries   Recent Labs Lab 06/29/15 1610  07/02/15 0425  NA 135  < > 137  K 4.7  < > 4.1  CL 102  < > 108  CO2 22  < > 25  GLUCOSE 143*  < > 106*  BUN 30*  < >  28*  CREATININE 2.56*  < > 2.47*  CALCIUM 8.6*  < > 8.1*  AST 30  --   --   ALT 28  --   --   ALKPHOS 125  --   --   BILITOT 0.8  --   --   < > = values in this interval not displayed. ------------------------------------------------------------------------------------------------------------------  Cardiac Enzymes  Recent Labs Lab 06/29/15 1610  TROPONINI 0.11*   ------------------------------------------------------------------------------------------------------------------  RADIOLOGY:  No results found.   ASSESSMENT AND PLAN:    70 year old male with a history of diabetes, essential hypertension and Streptococcus mutans discitis and bacteremia on vancomycin and since April 4 changed to [place] who is admitted to the hospital with confusion and weakness and found to have acute renal failure.  1. Acute renal failure: This may be due to ATN or related to vancomycin which she was on for discitis. Creatinine is better. Renal ultrasound: There is a complex cystic mass in the lower pole of the right kidney with multiple thin septations. Continue  NS iv, f/u BMP.   * Hematuria/cystic mass right kidney.  Follow-up urology consult.  2. Acute metabolic encephalopathy: This is likely due to acute renal failure CT head on admission showed no acute changes. Improved yesterday, but he was agitated and treated with Ativan last night.  3. History of discitis: Patient is now on oral Levaquin. He was on vancomycin until April 4. MRI of the lumbar spine showed worsening discitis but no abscess.  Negative blood cultures so far. Per Dr. Ola Spurr, would continue levofloxacin IV.  4. Diabetes: Continue sliding scale insulin, ADA diet and Lantus.  5. Essential hypertension: Continue metoprolol and when necessary hydralazine.  6. Acute on chronic anemia: Hemoglobin decreased to 6.8. Iron deficiency per anemia panel. Started iron po.  Transfuse if hemoglobin greater than or equal to 7. He  is getting 1 unit of PRBC transfusion. full anemia workup as an outpatient once acute symptoms resolve per Dr. Grayland Ormond.  Management plans discussed with the patient's wife and she is in agreement.  CODE STATUS: FULL  TOTAL TIME TAKING CARE OF THIS PATIENT: 39 minutes.     POSSIBLE D/C 3-4 days, DEPENDING ON CLINICAL CONDITION.   Demetrios Loll M.D on 07/02/2015 at 1:13 PM  Between 7am to 6pm - Pager - [phone] After 6pm go to [email or web address] - password [email or web address] [facility]  Office  [phone]  CC: Primary care physician; Ashok Norris, MD  Note: This dictation was prepared with Dragon dictation along with smaller phrase technology. Any transcriptional errors that result from this process are unintentional.

## 2015-07-02 NOTE — Progress Notes (Signed)
Called Dr. Marcille Blanco regarding patient's hemoglobin- 6.8.  Doctor put in orders to transfuse.  Christene Slates  07/02/2015 6:27 AM

## 2015-07-02 NOTE — Plan of Care (Signed)
Problem: Education: Goal: Knowledge of [facility] Education information/materials will improve Outcome: Not Progressing Patient is confused.  Problem: Health Behavior/Discharge Planning: Goal: Ability to manage health-related needs will improve Outcome: Not Progressing Needs family assistance to manage.

## 2015-07-02 NOTE — Progress Notes (Signed)
Pharmacy Antibiotic Note  Eric Glover is a 70 y.o. male admitted on 06/29/2015 with diskitis.  Pharmacy has been consulted for Levaquin dosing.  Plan: Levaquin 750 mg IV q 48 hours ordered. ID consulted also  Height: 6\' 2"  (188 cm) Weight: 214 lb 3.2 oz (97.16 kg) IBW/kg (Calculated) : 82.2  Temp (24hrs), Avg:98.2 F (36.8 C), Min:98 F (36.7 C), Max:98.8 F (37.1 C)   Recent Labs Lab 06/29/15 1610 06/30/15 0440 07/01/15 0457 07/01/15 1600 07/02/15 0425  WBC 9.9 9.4 9.5  --  7.8  CREATININE 2.56* 2.79* 2.60*  --  2.47*  LATICACIDVEN 1.2  --   --   --   --   VANCORANDOM  --   --   --  30  --     Estimated Creatinine Clearance: 32.8 mL/min (by C-G formula based on Cr of 2.47).    Allergies  Allergen Reactions  . Neomycin-Bacitracin Zn-Polymyx Rash    Antimicrobials this admission: Levaquin  >>    >>   Dose adjustments this admission:   Microbiology results: 4/6 [id number]   UA: LE(-) NO2 (+) WBC 0  Thank you for allowing pharmacy to be a part of this patient's care.  Benedicto Capozzi A 07/02/2015 1:17 PM

## 2015-07-02 NOTE — Progress Notes (Signed)
[facility]  ROUNDING NOTE   Subjective:  Patient seen at bedside. Creatinine slightly better today at 2.4. Vancomycin level was noted to be quite high at 30.    Objective:  Vital signs in last 24 hours:  Temp:  [98 F (36.7 C)-98.8 F (37.1 C)] 98.1 F (36.7 C) (04/09 1236) Pulse Rate:  [68-79] 68 (04/09 1236) Resp:  [18-22] 18 (04/09 1236) BP: (119-150)/(54-97) 145/59 mmHg (04/09 1236) SpO2:  [92 %-98 %] 95 % (04/09 1236) Weight:  [97.16 kg (214 lb 3.2 oz)] 97.16 kg (214 lb 3.2 oz) (04/09 0558)  Weight change: 2.812 kg (6 lb 3.2 oz) Filed Weights   06/30/15 0500 07/01/15 0500 07/02/15 0558  Weight: 97 kg (213 lb 13.5 oz) 94.348 kg (208 lb) 97.16 kg (214 lb 3.2 oz)    Intake/Output: I/O last 3 completed shifts: In: 2775 [P.O.:60; I.V.:2715] Out: -    Intake/Output this shift:  Total I/O In: 511 [I.V.:217; Blood:294] Out: 0   Physical Exam: General: NAD, resting in bed  Head: Normocephalic, atraumatic. Dry oral mucosal membranes  Eyes: Anicteric  Neck: Supple, trachea midline  Lungs:  Clear to auscultation normal effort  Heart: Regular rate and rhythm  Abdomen:  Soft, nontender, BS present  Extremities: trace peripheral edema.  Neurologic: Difficult to arouse today  Skin: No lesions       Basic Metabolic Panel:  Recent Labs Lab 06/29/15 1610 06/30/15 0440 07/01/15 0457 07/02/15 0425  NA 135 132* 135 137  K 4.7 4.8 4.6 4.1  CL 102 105 105 108  CO2 22 24 24 25   GLUCOSE 143* 121* 151* 106*  BUN 30* 28* 28* 28*  CREATININE 2.56* 2.79* 2.60* 2.47*  CALCIUM 8.6* 8.3* 8.5* 8.1*    Liver Function Tests:  Recent Labs Lab 06/29/15 1610  AST 30  ALT 28  ALKPHOS 125  BILITOT 0.8  PROT 6.7  ALBUMIN 2.9*   No results for input(s): LIPASE, AMYLASE in the last 168 hours.  Recent Labs Lab 06/29/15 2304  AMMONIA 26    CBC:  Recent Labs Lab 06/29/15 1610 06/30/15 0440 07/01/15 0457 07/02/15 0425  WBC 9.9 9.4 9.5 7.8   NEUTROABS 7.6*  --   --   --   HGB 8.3* 7.3* 7.3* 6.8*  HCT 25.2* 22.5* 22.0* 20.7*  MCV 79.1* 78.5* 79.3* 78.7*  PLT 554* 505* 480* 431    Cardiac Enzymes:  Recent Labs Lab 06/29/15 1610  TROPONINI 0.11*    BNP: Invalid input(s): POCBNP  CBG:  Recent Labs Lab 07/01/15 2007 07/02/15 0022 07/02/15 0413 07/02/15 0755 07/02/15 1201  GLUCAP 185* 169* 104* 112* 172*    Microbiology: Results for orders placed or performed during the hospital encounter of 06/29/15  Blood culture (routine x 2)     Status: None (Preliminary result)   Collection Time: 06/29/15  4:10 PM  Result Value Ref Range Status   Specimen Description BLOOD LEFT ARM  Final   Special Requests BOTTLES DRAWN AEROBIC AND ANAEROBIC  1CC  Final   Culture NO GROWTH 2 DAYS  Final   Report Status PENDING  Incomplete  Blood culture (routine x 2)     Status: None (Preliminary result)   Collection Time: 06/29/15  4:11 PM  Result Value Ref Range Status   Specimen Description BLOOD RIGHT ASSIST CONTROL  Final   Special Requests BOTTLES DRAWN AEROBIC AND ANAEROBIC  14CC  Final   Culture NO GROWTH 2 DAYS  Final   Report Status PENDING  Incomplete    Coagulation Studies:  Recent Labs  07/02/15 0552  LABPROT 22.3*  INR 1.97    Urinalysis:  Recent Labs  06/29/15 2030  COLORURINE AMBER*  LABSPEC 1.012  PHURINE 5.0  GLUCOSEU NEGATIVE  HGBUR 1+*  BILIRUBINUR NEGATIVE  KETONESUR TRACE*  PROTEINUR NEGATIVE  NITRITE POSITIVE*  LEUKOCYTESUR NEGATIVE      Imaging: No results found.   Medications:     . allopurinol  100 mg Oral Daily  . amiodarone  200 mg Oral Daily  . apixaban  5 mg Oral BID  . docusate sodium  100 mg Oral BID  . doxazosin  8 mg Oral QHS  . fentaNYL  50 mcg Transdermal Q72H  . ferrous sulfate  325 mg Oral BID WC  . insulin aspart  0-9 Units Subcutaneous 6 times per day  . insulin detemir  5 Units Subcutaneous QHS  . levofloxacin (LEVAQUIN) IV  750 mg Intravenous Q48H  .  metoprolol tartrate  25 mg Oral BID  . pantoprazole  40 mg Oral Daily   acetaminophen **OR** acetaminophen, albuterol, bisacodyl, budesonide (PULMICORT) nebulizer solution, fluticasone, HYDROcodone-acetaminophen, LORazepam, morphine injection, ondansetron **OR** ondansetron (ZOFRAN) IV, polyethylene glycol, sodium chloride flush  Assessment/ Plan:  70 y.o. male with a PMHX of DM-2, HTN, diskitis treated with iv VANC, history of acute right pulmonary embolus on 05/11/2015, was admitted on 06/29/2015 with weakness, altered mental status and acute renal failure.   1. Acute renal failure, baseline Cr 0.94 from 06/16/15. Likely secondary to ATN from concurrent infection or vancomycin toxicity -Vancomycin level high at 30. Creatinine is trending down slowly. Continue supportive care at this moment in time. Agree with blood transfusion as volume repletion.  2. 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  3. Hematuria/cystic mass right kidney: Appreciate urology opinion by Dr. Tresa Moore. Agree that patient has significant comorbidities and he would have significant surgical risk. At this point in time MRI of the abdomen and pelvis is recommended with periodic surveillance.  He will require local follow-up for this lesion at [place].     LOS: 3 Eric Glover 4/9/20172:27 PM

## 2015-07-03 ENCOUNTER — Inpatient Hospital Stay: Payer: Non-veteran care

## 2015-07-03 DIAGNOSIS — N281 Cyst of kidney, acquired: Secondary | ICD-10-CM

## 2015-07-03 DIAGNOSIS — R32 Unspecified urinary incontinence: Secondary | ICD-10-CM

## 2015-07-03 DIAGNOSIS — N179 Acute kidney failure, unspecified: Secondary | ICD-10-CM

## 2015-07-03 LAB — GLUCOSE, CAPILLARY
GLUCOSE-CAPILLARY: 80 mg/dL (ref 65–99)
GLUCOSE-CAPILLARY: 102 mg/dL — AB (ref 65–99)
GLUCOSE-CAPILLARY: 146 mg/dL — AB (ref 65–99)
GLUCOSE-CAPILLARY: 181 mg/dL — AB (ref 65–99)
Glucose-Capillary: 171 mg/dL — ABNORMAL HIGH (ref 65–99)
Glucose-Capillary: 205 mg/dL — ABNORMAL HIGH (ref 65–99)

## 2015-07-03 LAB — BASIC METABOLIC PANEL
ANION GAP: 5 (ref 5–15)
BUN: 23 mg/dL — AB (ref 6–20)
CALCIUM: 8.1 mg/dL — AB (ref 8.9–10.3)
CO2: 24 mmol/L (ref 22–32)
Chloride: 109 mmol/L (ref 101–111)
Creatinine, Ser: 2.09 mg/dL — ABNORMAL HIGH (ref 0.61–1.24)
GFR calc Af Amer: 36 mL/min — ABNORMAL LOW (ref >60)
GFR, EST NON AFRICAN AMERICAN: 31 mL/min — AB (ref >60)
GLUCOSE: 89 mg/dL (ref 65–99)
Potassium: 3.9 mmol/L (ref 3.5–5.1)
SODIUM: 138 mmol/L (ref 135–145)

## 2015-07-03 LAB — CBC
HCT: 25.6 % — ABNORMAL LOW (ref 40.0–52.0)
Hemoglobin: 8.5 g/dL — ABNORMAL LOW (ref 13.0–18.0)
MCH: 26.4 pg (ref 26.0–34.0)
MCHC: 33.2 g/dL (ref 32.0–36.0)
MCV: 79.5 fL — ABNORMAL LOW (ref 80.0–100.0)
PLATELETS: 454 10*3/uL — AB (ref 150–440)
RBC: 3.22 MIL/uL — ABNORMAL LOW (ref 4.40–5.90)
RDW: 16.3 % — AB (ref 11.5–14.5)
WBC: 7 10*3/uL (ref 3.8–10.6)

## 2015-07-03 NOTE — Progress Notes (Signed)
[facility]  ROUNDING NOTE   Subjective:  Creatinine down to 2.09 at present. Patient seems to be a bit more awake and alert today.   Objective:  Vital signs in last 24 hours:  Temp:  [98 F (36.7 C)-98.2 F (36.8 C)] 98.2 F (36.8 C) (04/10 1436) Pulse Rate:  [64-79] 79 (04/10 1436) Resp:  [16-20] 17 (04/10 1436) BP: (125-148)/(57-72) 139/65 mmHg (04/10 1436) SpO2:  [92 %-99 %] 92 % (04/10 1436) Weight:  [96.435 kg (212 lb 9.6 oz)] 96.435 kg (212 lb 9.6 oz) (04/10 0403)  Weight change: -0.726 kg (-1 lb 9.6 oz) Filed Weights   07/01/15 0500 07/02/15 0558 07/03/15 0403  Weight: 94.348 kg (208 lb) 97.16 kg (214 lb 3.2 oz) 96.435 kg (212 lb 9.6 oz)    Intake/Output: I/O last 3 completed shifts: In: 2470.9 [P.O.:60; I.V.:1843.9; Blood:567] Out: 0    Intake/Output this shift:     Physical Exam: General: NAD, resting in bed  Head: Normocephalic, atraumatic. Dry oral mucosal membranes  Eyes: Anicteric  Neck: Supple, trachea midline  Lungs:  Clear to auscultation normal effort  Heart: Regular rate and rhythm  Abdomen:  Soft, nontender, BS present  Extremities: trace peripheral edema.  Neurologic: Awake, alert, confused  Skin: No lesions       Basic Metabolic Panel:  Recent Labs Lab 06/29/15 1610 06/30/15 0440 07/01/15 0457 07/02/15 0425 07/03/15 0454  NA 135 132* 135 137 138  K 4.7 4.8 4.6 4.1 3.9  CL 102 105 105 108 109  CO2 22 24 24 25 24   GLUCOSE 143* 121* 151* 106* 89  BUN 30* 28* 28* 28* 23*  CREATININE 2.56* 2.79* 2.60* 2.47* 2.09*  CALCIUM 8.6* 8.3* 8.5* 8.1* 8.1*    Liver Function Tests:  Recent Labs Lab 06/29/15 1610  AST 30  ALT 28  ALKPHOS 125  BILITOT 0.8  PROT 6.7  ALBUMIN 2.9*   No results for input(s): LIPASE, AMYLASE in the last 168 hours.  Recent Labs Lab 06/29/15 2304  AMMONIA 26    CBC:  Recent Labs Lab 06/29/15 1610 06/30/15 0440 07/01/15 0457 07/02/15 0425 07/03/15 0454  WBC 9.9 9.4 9.5 7.8 7.0   NEUTROABS 7.6*  --   --   --   --   HGB 8.3* 7.3* 7.3* 6.8* 8.5*  HCT 25.2* 22.5* 22.0* 20.7* 25.6*  MCV 79.1* 78.5* 79.3* 78.7* 79.5*  PLT 554* 505* 480* 431 454*    Cardiac Enzymes:  Recent Labs Lab 06/29/15 1610  TROPONINI 0.11*    BNP: Invalid input(s): POCBNP  CBG:  Recent Labs Lab 07/02/15 2027 07/02/15 2347 07/03/15 0357 07/03/15 0717 07/03/15 1129  GLUCAP 185* 146* 80 102* 146*    Microbiology: Results for orders placed or performed during the hospital encounter of 06/29/15  Blood culture (routine x 2)     Status: None (Preliminary result)   Collection Time: 06/29/15  4:10 PM  Result Value Ref Range Status   Specimen Description BLOOD LEFT ARM  Final   Special Requests BOTTLES DRAWN AEROBIC AND ANAEROBIC  1CC  Final   Culture NO GROWTH 4 DAYS  Final   Report Status PENDING  Incomplete  Blood culture (routine x 2)     Status: None (Preliminary result)   Collection Time: 06/29/15  4:11 PM  Result Value Ref Range Status   Specimen Description BLOOD RIGHT ASSIST CONTROL  Final   Special Requests BOTTLES DRAWN AEROBIC AND ANAEROBIC  14CC  Final   Culture NO GROWTH  4 DAYS  Final   Report Status PENDING  Incomplete    Coagulation Studies:  Recent Labs  07/02/15 0552  LABPROT 22.3*  INR 1.97    Urinalysis: No results for input(s): COLORURINE, LABSPEC, PHURINE, GLUCOSEU, HGBUR, BILIRUBINUR, KETONESUR, PROTEINUR, UROBILINOGEN, NITRITE, LEUKOCYTESUR in the last 72 hours.  Invalid input(s): APPERANCEUR    Imaging: Mr Herby Abraham Contrast  07/03/2015  CLINICAL DATA:  70 year old diabetic hypertensive male with altered mental status. Lumbar discitis. Subsequent encounter. EXAM: MRI HEAD WITHOUT CONTRAST TECHNIQUE: Multiplanar, multiecho pulse sequences of the brain and surrounding structures were obtained without intravenous contrast. COMPARISON:  06/29/2015 head CT.  05/16/2015 brain MR. FINDINGS: Exam is motion degraded.  FLAIR imaging not obtained. No  acute infarct or obvious intracranial hemorrhage. Limited for evaluating for the possibility of intracranial infection or mass given the lack of contrast administration, FLAIR imaging and the degree of motion degradation. Global atrophy without hydrocephalus. Major intracranial vascular structures are patent. IMPRESSION: No acute infarct or obvious intracranial hemorrhage. Limited for evaluating for the possibility of intracranial infection or mass given the lack of contrast administration, lack of FLAIR imaging and the degree of motion degradation. Global atrophy without hydrocephalus. Electronically Signed   By: Genia Del M.D.   On: 07/03/2015 10:36   Mr Abdomen Wo Contrast  07/03/2015  CLINICAL DATA:  Complex cystic mass in the lower pole of the right kidney on recent ultrasound exam. EXAM: MRI ABDOMEN WITHOUT CONTRAST TECHNIQUE: Multiplanar multisequence MR imaging was performed without the administration of intravenous contrast. COMPARISON:  Ultrasound exam from 06/30/2015. FINDINGS: Of note, this exam is markedly motion degraded and fine detail is obscured by the motion artifact. Lower chest:  Unremarkable. Hepatobiliary: No gross abnormality evident although liver parenchyma is largely obscured on all pulse sequences. Large gallstone measures up to 2.6 cm. Pancreas: Pancreatic parenchyma of discernible on only 1 pulse sequence. No gross pancreatic mass lesion. No definite dilatation of the main pancreatic duct. Spleen: Limit assessment shows no focal abnormality. Adrenals/Urinary Tract: 2.7 cm cystic lesion identified upper pole left kidney 3.2 cm cystic lesion identified interpolar left kidney. 2.0 cm cystic lesion identified lower pole right kidney. No hydronephrosis on either side. Stomach/Bowel: No evidence for bowel obstruction. Vascular/Lymphatic: No abdominal aortic aneurysm. No gross abdominal lymphadenopathy. Other: No substantial intraperitoneal free fluid. Musculoskeletal: There is some mild  dependent body wall edema evident. Previous imaging studies documented discitis osteomyelitis at L2-3 and L5-S1, not well demonstrated on the current exam. IMPRESSION: 1. Markedly limited study by substantial motion degradation. Cystic lesion identified lower pole right kidney cannot be further characterized given the patient's inability to reproducibly breath hold. As such, consider CT to further evaluate given the better temporal resolution of that exam. Cystic lesions are also identified in the left kidney. 2. Cholelithiasis. 3. Changes of discitis osteomyelitis at L2-3 and L5-S1 are better demonstrated on other recent dedicated lumbar spine imaging exams. Electronically Signed   By: Misty Stanley M.D.   On: 07/03/2015 10:23     Medications:   . sodium chloride 75 mL/hr at 07/03/15 1103   . allopurinol  100 mg Oral Daily  . amiodarone  200 mg Oral Daily  . apixaban  5 mg Oral BID  . docusate sodium  100 mg Oral BID  . doxazosin  8 mg Oral QHS  . fentaNYL  50 mcg Transdermal Q72H  . ferrous sulfate  325 mg Oral BID WC  . insulin aspart  0-9 Units Subcutaneous 6 times per day  .  insulin detemir  5 Units Subcutaneous QHS  . levofloxacin (LEVAQUIN) IV  750 mg Intravenous Q48H  . metoprolol tartrate  25 mg Oral BID  . pantoprazole  40 mg Oral Daily   acetaminophen **OR** acetaminophen, albuterol, bisacodyl, budesonide (PULMICORT) nebulizer solution, fluticasone, HYDROcodone-acetaminophen, LORazepam, morphine injection, ondansetron **OR** ondansetron (ZOFRAN) IV, polyethylene glycol, sodium chloride flush  Assessment/ Plan:  70 y.o. male with a PMHX of DM-2, HTN, diskitis treated with iv VANC, history of acute right pulmonary embolus on 05/11/2015, was admitted on 06/29/2015 with weakness, altered mental status and acute renal failure.   1. Acute renal failure, baseline Cr 0.94 from 06/16/15. Likely secondary to ATN from concurrent infection or vancomycin toxicity -Vancomycin level high at 30.   - renal function improved.  Creatinine down to 2.09. - continue to monitor renal function daily.  2. 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  3. Hematuria/cystic mass right kidney: Appreciate urology opinion by Dr. Tresa Moore. Agree that patient has significant comorbidities and he would have significant surgical risk. MRI abdomen completed howeversignificant motion artifact noted.  We may need to consider CT scan of the abdomen and pelvis however this would have to be performed withoutcontrast given renal insufficiency.     LOS: 4 Suzanne Kho 4/10/20173:22 PM

## 2015-07-03 NOTE — Evaluation (Signed)
Physical Therapy Evaluation Patient Details Name: Eric Glover MRN: [id number] DOB: 09-24-1945 Today's Date: 07/03/2015   History of Present Illness  Patient is a 70 y/o who presents with confusion (acute metabolic encephalopathy) and weakness. Patient also noted to have acute on chronic renal failure. 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   Clinical Impression  Patient recently seen at this facility and noted to have some diskitis with neurologic symptoms at that time. Patient reports mild numbness in his R foot with modification of the slump test, indicating he is likely still having some disc related involvement. He is able to tolerate limited ambulation, however he begins developing lower back pain with ambulation (in part due to poor use of a walker that was too small- which was adjusted). Patient at this time is primarily limited by lower back pain with ambulation, as he is otherwise able to complete bed mobility and transfers better than anticipated. He is primarily limited in mobility by pain with ambulation currently. Patient would benefit from short term rehabilitation to increase his tolerance for mobility for successful return home after discharge.     Follow Up Recommendations SNF    Equipment Recommendations       Recommendations for Other Services       Precautions / Restrictions Precautions Precautions: Fall Restrictions Weight Bearing Restrictions: No      Mobility  Bed Mobility Overal bed mobility: Needs Assistance Bed Mobility: Supine to Sit     Supine to sit: Supervision     General bed mobility comments: Patient takes slightly prolonged time, but no assistance required to complete transfer.   Transfers Overall transfer level: Needs assistance Equipment used: Rolling walker (2 wheeled) Transfers: Sit to/from Stand Sit to Stand: Min guard;Min assist;+2 safety/equipment         General transfer comment:  Patient initially grabs IV pole, instructed to use handlebars of RW. Does surprisingly well and requires very little assistance to complete transfer.   Ambulation/Gait Ambulation/Gait assistance: Min guard;Min assist Ambulation Distance (Feet): 20 Feet Assistive device: Rolling walker (2 wheeled) Gait Pattern/deviations: Trunk flexed;Decreased step length - left;Decreased step length - right   Gait velocity interpretation: Below normal speed for age/gender General Gait Details: Patient takes very slow paced gait, trunk flexed with cuing for coming closer to RW (which turned out to be too low for him, PT adjusted after session). No knee buckling noted.   Stairs            Wheelchair Mobility    Modified Rankin (Stroke Patients Only)       Balance Overall balance assessment: Needs assistance Sitting-balance support: Bilateral upper extremity supported Sitting balance-Leahy Scale: Fair     Standing balance support: Bilateral upper extremity supported Standing balance-Leahy Scale: Fair                               Pertinent Vitals/Pain Pain Assessment: Faces Faces Pain Scale: Hurts little more Pain Location: Lumbar spine with ambulation.  Pain Descriptors / Indicators: Constant;Aching Pain Intervention(s): Limited activity within patient's tolerance;Monitored during session;Repositioned    Home Living Family/patient expects to be discharged to:: Private residence Living Arrangements: Spouse/significant other Available Help at Discharge: Family Type of Home: House Home Access: Level entry     Home Layout: One level        Prior Function Level of Independence: Independent         Comments: Per  family patient had been independent at home prior to this admission. He was noted to be independent in previous evaluation, though he appears to be increasingly limited by LBP.      Hand Dominance        Extremity/Trunk Assessment   Upper Extremity  Assessment: Overall WFL for tasks assessed           Lower Extremity Assessment: Overall WFL for tasks assessed (Appeared to have more pain with RLE knee extension in sitting)         Communication   Communication: No difficulties  Cognition Arousal/Alertness: Awake/alert Behavior During Therapy: WFL for tasks assessed/performed Overall Cognitive Status: Impaired/Different from baseline (Patient drifted in and out of sleep, required cuing for hand positioning and not to take hands off walker. Otherwise able to follow commands. Unable to recall what PT said to begin session)                      General Comments General comments (skin integrity, edema, etc.): Bruising around mindline of posterior lumbar spine.     Exercises        Assessment/Plan    PT Assessment Patient needs continued PT services  PT Diagnosis Difficulty walking;Generalized weakness   PT Problem List Decreased strength;Pain;Decreased safety awareness;Decreased knowledge of use of DME;Decreased activity tolerance;Decreased balance;Decreased mobility  PT Treatment Interventions Gait training;DME instruction;Stair training;Therapeutic activities;Therapeutic exercise;Balance training;Manual techniques   PT Goals (Current goals can be found in the Care Plan section) Acute Rehab PT Goals Patient Stated Goal: To increase his ability to ambulate.  PT Goal Formulation: With patient/family Time For Goal Achievement: 07/17/15 Potential to Achieve Goals: Good    Frequency Min 2X/week   Barriers to discharge        Co-evaluation               End of Session Equipment Utilized During Treatment: Gait belt Activity Tolerance: Patient tolerated treatment well;Patient limited by pain Patient left: in chair;with call bell/phone within reach;with chair alarm set;with family/visitor present Nurse Communication: Mobility status         Time: [id number] PT Time Calculation (min) (ACUTE ONLY): 17  min   Charges:   PT Evaluation $PT Eval Moderate Complexity: 1 Procedure     PT G Codes:       Kerman Passey, PT, DPT    07/03/2015, 6:09 PM

## 2015-07-03 NOTE — Progress Notes (Signed)
[facility] INFECTIOUS DISEASE PROGRESS NOTE Date of Admission:  06/29/2015     ID: Eric Glover is a 70 y.o. male with  discitis  Active Problems:   Fall   Subjective Getting Ct Wife says he was very clear this am but got ativean for MRI and now shaky again. Still with back pain  No fevers  ROS  Eleven systems are reviewed and negative except per hpi  Medications:  Antibiotics Given (last 72 hours)    Date/Time Action Medication Dose Rate   07/02/15 1145 Given   levofloxacin (LEVAQUIN) IVPB 750 mg 750 mg 100 mL/hr     . allopurinol  100 mg Oral Daily  . amiodarone  200 mg Oral Daily  . apixaban  5 mg Oral BID  . docusate sodium  100 mg Oral BID  . doxazosin  8 mg Oral QHS  . fentaNYL  50 mcg Transdermal Q72H  . ferrous sulfate  325 mg Oral BID WC  . insulin aspart  0-9 Units Subcutaneous 6 times per day  . insulin detemir  5 Units Subcutaneous QHS  . levofloxacin (LEVAQUIN) IV  750 mg Intravenous Q48H  . metoprolol tartrate  25 mg Oral BID  . pantoprazole  40 mg Oral Daily    Objective: Vital signs in last 24 hours: Temp:  [98 F (36.7 C)-98.2 F (36.8 C)] 98.2 F (36.8 C) (04/10 1436) Pulse Rate:  [64-79] 79 (04/10 1436) Resp:  [16-20] 17 (04/10 1436) BP: (125-148)/(57-72) 139/65 mmHg (04/10 1436) SpO2:  [92 %-99 %] 95 % (04/10 1500) Weight:  [96.435 kg (212 lb 9.6 oz)] 96.435 kg (212 lb 9.6 oz) (04/10 0403)   Lab Results  Recent Labs  07/02/15 0425 07/03/15 0454  WBC 7.8 7.0  HGB 6.8* 8.5*  HCT 20.7* 25.6*  NA 137 138  K 4.1 3.9  CL 108 109  CO2 25 24  BUN 28* 23*  CREATININE 2.47* 2.09*    Microbiology: BCX negative  Studies/Results: Mr Brain Wo Contrast  07/03/2015  CLINICAL DATA:  70 year old diabetic hypertensive male with altered mental status. Lumbar discitis. Subsequent encounter. EXAM: MRI HEAD WITHOUT CONTRAST TECHNIQUE: Multiplanar, multiecho pulse sequences of the brain and surrounding structures were obtained without  intravenous contrast. COMPARISON:  06/29/2015 head CT.  05/16/2015 brain MR. FINDINGS: Exam is motion degraded.  FLAIR imaging not obtained. No acute infarct or obvious intracranial hemorrhage. Limited for evaluating for the possibility of intracranial infection or mass given the lack of contrast administration, FLAIR imaging and the degree of motion degradation. Global atrophy without hydrocephalus. Major intracranial vascular structures are patent. IMPRESSION: No acute infarct or obvious intracranial hemorrhage. Limited for evaluating for the possibility of intracranial infection or mass given the lack of contrast administration, lack of FLAIR imaging and the degree of motion degradation. Global atrophy without hydrocephalus. Electronically Signed   By: Genia Del M.D.   On: 07/03/2015 10:36   Mr Abdomen Wo Contrast  07/03/2015  CLINICAL DATA:  Complex cystic mass in the lower pole of the right kidney on recent ultrasound exam. EXAM: MRI ABDOMEN WITHOUT CONTRAST TECHNIQUE: Multiplanar multisequence MR imaging was performed without the administration of intravenous contrast. COMPARISON:  Ultrasound exam from 06/30/2015. FINDINGS: Of note, this exam is markedly motion degraded and fine detail is obscured by the motion artifact. Lower chest:  Unremarkable. Hepatobiliary: No gross abnormality evident although liver parenchyma is largely obscured on all pulse sequences. Large gallstone measures up to 2.6 cm. Pancreas: Pancreatic parenchyma of discernible on only  1 pulse sequence. No gross pancreatic mass lesion. No definite dilatation of the main pancreatic duct. Spleen: Limit assessment shows no focal abnormality. Adrenals/Urinary Tract: 2.7 cm cystic lesion identified upper pole left kidney 3.2 cm cystic lesion identified interpolar left kidney. 2.0 cm cystic lesion identified lower pole right kidney. No hydronephrosis on either side. Stomach/Bowel: No evidence for bowel obstruction. Vascular/Lymphatic: No  abdominal aortic aneurysm. No gross abdominal lymphadenopathy. Other: No substantial intraperitoneal free fluid. Musculoskeletal: There is some mild dependent body wall edema evident. Previous imaging studies documented discitis osteomyelitis at L2-3 and L5-S1, not well demonstrated on the current exam. IMPRESSION: 1. Markedly limited study by substantial motion degradation. Cystic lesion identified lower pole right kidney cannot be further characterized given the patient's inability to reproducibly breath hold. As such, consider CT to further evaluate given the better temporal resolution of that exam. Cystic lesions are also identified in the left kidney. 2. Cholelithiasis. 3. Changes of discitis osteomyelitis at L2-3 and L5-S1 are better demonstrated on other recent dedicated lumbar spine imaging exams. Electronically Signed   By: Misty Stanley M.D.   On: 07/03/2015 10:23     Assessment/Plan: Eric Glover is a 70 y.o. 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 He was admitted and noted to be hypotensive and tachycardic but this responded to fluid bolus. His Cr was 1.29 as otpt labs but 2.26 on admission. CTx has been held, RUQ USS shows large gallstones but no cholecystitis. He was seen by surgery who felt LFTs were not related to the gallstones. He was transitioned to IV vancomycin and was on this until 4/6 when he got his last dose of vanco and was changed to oral levofloxacin (has only received one dose). He was having some issues with dysuria at that time but ua and ucx were negative. He was admitted 4/6 with a fall and confusion. Cr was elevated. New and progressive anemia. He is very confused. MRI repeated and shows  continued infection in spine with minimal improvement . He was very delirous at admission and has been getting benzos, opiates and benadryl. LFTs are normalized Ammonia nml, lactate nml. UA neg Does have renal mass noted on MRI   Recommendation I think he was delirious from meds and ARF. His wbc is nml, but I am very concerned about his new progressive anemia. Has seen onc and had transfuion   would continue levofloxacin IV or PO for another 3-4 weeks  Discussed with wife that once stable we can consider asking  IR to aspirate the infected spine to help see if other infection present although would be low yield I think I think he would be stable for transfer now if bed available at [facility]  Thank you very much for the consult. Will follow with you.  FITZGERALD, DAVID P   07/03/2015, 4:06 PM

## 2015-07-03 NOTE — Progress Notes (Signed)
[facility] [place] NAME: Eric Glover    MR#:  [id number]  DATE OF BIRTH:  25-Feb-1946  SUBJECTIVE:   Pt is confused. No complaint.  REVIEW OF SYSTEMS:    Unable to get a ROS.  DRUG ALLERGIES:   Allergies  Allergen Reactions  . Neomycin-Bacitracin Zn-Polymyx Rash    VITALS:  Blood pressure 125/72, pulse 64, temperature 98 F (36.7 C), temperature source Oral, resp. rate 20, height 6\' 2"  (1.88 m), weight 96.435 kg (212 lb 9.6 oz), SpO2 98 %.  PHYSICAL EXAMINATION:   Physical Exam  Constitutional: He is well-developed, well-nourished, and in no distress. Sleeping. HENT: dry oral mucosa. Head: Normocephalic.  Eyes: No scleral icterus.  Neck: No JVD present. No tracheal deviation present.  Cardiovascular: Normal rate, regular rhythm and normal heart sounds.  Exam reveals no gallop and no friction rub.   No murmur heard. Pulmonary/Chest: Effort normal and breath sounds normal. No respiratory distress. He has no wheezes. He has no rales. He exhibits no tenderness.  Abdominal: Soft. Bowel sounds are normal. He exhibits no distension and no mass. There is no tenderness. There is no rebound and no guarding.  Musculoskeletal: Normal range of motion. He exhibits no edema.  Lymphadenopathy:    He has no cervical adenopathy.  Neurological: he is confused, knows his name and time, not location. Follow commands, no intracranial deficit.  Power 4/5 in all extremities. Sensation intact. Skin: Skin is warm. No rash noted. No erythema.      LABORATORY PANEL:   CBC  Recent Labs Lab 07/03/15 0454  WBC 7.0  HGB 8.5*  HCT 25.6*  PLT 454*   ------------------------------------------------------------------------------------------------------------------  Chemistries   Recent Labs Lab 06/29/15 1610  07/03/15 0454  NA 135  < > 138  K 4.7  < > 3.9  CL 102  < > 109  CO2 22  < > 24  GLUCOSE 143*  < > 89  BUN 30*  < >  23*  CREATININE 2.56*  < > 2.09*  CALCIUM 8.6*  < > 8.1*  AST 30  --   --   ALT 28  --   --   ALKPHOS 125  --   --   BILITOT 0.8  --   --   < > = values in this interval not displayed. ------------------------------------------------------------------------------------------------------------------  Cardiac Enzymes  Recent Labs Lab 06/29/15 1610  TROPONINI 0.11*   ------------------------------------------------------------------------------------------------------------------  RADIOLOGY:  Mr Brain Wo Contrast  07/03/2015  CLINICAL DATA:  70 year old diabetic hypertensive male with altered mental status. Lumbar discitis. Subsequent encounter. EXAM: MRI HEAD WITHOUT CONTRAST TECHNIQUE: Multiplanar, multiecho pulse sequences of the brain and surrounding structures were obtained without intravenous contrast. COMPARISON:  06/29/2015 head CT.  05/16/2015 brain MR. FINDINGS: Exam is motion degraded.  FLAIR imaging not obtained. No acute infarct or obvious intracranial hemorrhage. Limited for evaluating for the possibility of intracranial infection or mass given the lack of contrast administration, FLAIR imaging and the degree of motion degradation. Global atrophy without hydrocephalus. Major intracranial vascular structures are patent. IMPRESSION: No acute infarct or obvious intracranial hemorrhage. Limited for evaluating for the possibility of intracranial infection or mass given the lack of contrast administration, lack of FLAIR imaging and the degree of motion degradation. Global atrophy without hydrocephalus. Electronically Signed   By: Genia Del M.D.   On: 07/03/2015 10:36   Mr Abdomen Wo Contrast  07/03/2015  CLINICAL DATA:  Complex cystic mass in the  lower pole of the right kidney on recent ultrasound exam. EXAM: MRI ABDOMEN WITHOUT CONTRAST TECHNIQUE: Multiplanar multisequence MR imaging was performed without the administration of intravenous contrast. COMPARISON:  Ultrasound exam from  06/30/2015. FINDINGS: Of note, this exam is markedly motion degraded and fine detail is obscured by the motion artifact. Lower chest:  Unremarkable. Hepatobiliary: No gross abnormality evident although liver parenchyma is largely obscured on all pulse sequences. Large gallstone measures up to 2.6 cm. Pancreas: Pancreatic parenchyma of discernible on only 1 pulse sequence. No gross pancreatic mass lesion. No definite dilatation of the main pancreatic duct. Spleen: Limit assessment shows no focal abnormality. Adrenals/Urinary Tract: 2.7 cm cystic lesion identified upper pole left kidney 3.2 cm cystic lesion identified interpolar left kidney. 2.0 cm cystic lesion identified lower pole right kidney. No hydronephrosis on either side. Stomach/Bowel: No evidence for bowel obstruction. Vascular/Lymphatic: No abdominal aortic aneurysm. No gross abdominal lymphadenopathy. Other: No substantial intraperitoneal free fluid. Musculoskeletal: There is some mild dependent body wall edema evident. Previous imaging studies documented discitis osteomyelitis at L2-3 and L5-S1, not well demonstrated on the current exam. IMPRESSION: 1. Markedly limited study by substantial motion degradation. Cystic lesion identified lower pole right kidney cannot be further characterized given the patient's inability to reproducibly breath hold. As such, consider CT to further evaluate given the better temporal resolution of that exam. Cystic lesions are also identified in the left kidney. 2. Cholelithiasis. 3. Changes of discitis osteomyelitis at L2-3 and L5-S1 are better demonstrated on other recent dedicated lumbar spine imaging exams. Electronically Signed   By: Misty Stanley M.D.   On: 07/03/2015 10:23     ASSESSMENT AND PLAN:    70 year old male with a history of diabetes, essential hypertension and Streptococcus mutans discitis and bacteremia on vancomycin and since April 4 changed to [place] who is admitted to the hospital with confusion  and weakness and found to have acute renal failure.  1. Acute renal failure: This may be due to ATN or related to vancomycin which she was on for discitis. improving. Renal ultrasound: There is a complex cystic mass in the lower pole of the right kidney with multiple thin septations.  Continue  NS iv, f/u BMP.   * Hematuria/cystic mass right kidney.  MRI showed Cystic lesion identified lower pole right kidney cannot be further characterized given the patient's inability to reproducibly breath hold. Follow-up urology consult.  2. Acute metabolic encephalopathy: This is likely due to acute renal failure CT head on admission showed no acute changes. Better, but still confused. MRI brain showed no CVA or hemohrrage.  3. History of discitis:  He was on vancomycin until April 4. MRI of the lumbar spine showed worsening discitis but no abscess. Negative blood cultures so far. Per Dr. Ola Spurr, would continue levofloxacin IV for at least 2 weeks.  4. Diabetes: Continue sliding scale insulin, ADA diet and Lantus.  5. Essential hypertension: Continue metoprolol and when necessary hydralazine.  6. Acute on chronic anemia: Hemoglobin decreased to 6.8. Iron deficiency per anemia panel. Started iron po.  He got 1 unit of PRBC transfusion. Hb is up to 8.5. full anemia workup as an outpatient once acute symptoms resolve per Dr. Grayland Ormond.  I discussed with Dr. Ola Spurr. Management plans discussed with the patient's wife and she is in agreement. Greater than 50% time was spent on coordination of care and face-to-face counseling.  CODE STATUS: FULL  TOTAL TIME TAKING CARE OF THIS PATIENT: 42 minutes.     POSSIBLE D/C  2-3 days, DEPENDING ON CLINICAL CONDITION.   Demetrios Loll M.D on 07/03/2015 at 1:43 PM  Between 7am to 6pm - Pager - [phone] After 6pm go to [email or web address] - password [email or web address] [facility]  Office  [phone]  CC: Primary care physician; Ashok Norris, MD  Note: This dictation was prepared with Dragon dictation along with smaller phrase technology. Any transcriptional errors that result from this process are unintentional.

## 2015-07-03 NOTE — Progress Notes (Signed)
Pt family would strongly prefer patient not to take ativan. They state that pt took xanax at home as needed for anxiety.  Dr. Bridgett Larsson aware. Continue to assess.

## 2015-07-03 NOTE — Progress Notes (Signed)
Speech Language Pathology Treatment: Dysphagia  Patient Details Name: Eric Glover MRN: [id number] DOB: 04-Jul-1945 Today's Date: 07/03/2015 Time: 1330-1430 SLP Time Calculation (min) (ACUTE ONLY): 60 min  Assessment / Plan / Recommendation Clinical Impression  Pt appeared to tolerate trials of Thin liquids via cup, then pinched straw as needed, w/ aspiration precautions w/ no overt s/s of aspiration noted w/ trials except for a mild, delayed cough x1 w/ all trials. Pt was able to help hold cup and exhibited increased oral awareness for boluses by assisting. Min. Oral phase deficits suspect impacted by the confusion and decreased awareness overall post Ativan given this AM. Suspect slight delay in swallow initiation which could increased risk for aspiration. Pt required max. Support w/ feeding and verbal/tactile/visual cues including instruction to open his eyes when nec to attend during task. Rec. Upgrade in the consistency of liquids to Thin consistency but continue w/ the pureed diet w/ oral phase monitoring - will introduce trials of soft foods (Dys. 2-3 level) next 1-2 days as appropriate. Pt's continued confusion increases risk for aspiration at this time. NSG updated; Dtr agreed. Time spent educating wife on aspiration precautions and feeding support and facilitation for pt; food options and prep ideas to increase intake.    HPI HPI: Pt is a 70 y.o. male with a known history of Diabetes, hypertension, GERD, gout, afib, PE, and discitis with Streptococcus mutans bacteremia who has been treated with IV vancomycin at home finished his course on 06/27/2015 and transitioned to [place]. Today patient had a fall and was seen to be more confused and weak and brought to the emergency room. Here patient has received a dose of Ativan and Benadryl for an MRI of the back and he is more restless. He has also been found to have acute renal failure. Baseline creatinine is close to 1. Presently is at 2.5. He  has been feeling dizzy and has had dark urine. As per the wife he has had good oral intake. Wife reported pt has become severely confused and impulsive/agitated (reaching in air and trying to climb out of the bed) when given Ativan 2/2x this admission; recently given Ativan this AM for MRI. Pt is currently confused and fidgity/impulsive in bed. He required mod-max. verbal/tactile cues to follow through w/ tasks during dysphagia tx.       SLP Plan  Continue with current plan of care     Recommendations  Diet recommendations: Dysphagia 1 (puree);Thin liquid Liquids provided via: Cup;Straw Medication Administration: Crushed with puree Supervision: Patient able to self feed;Full supervision/cueing for compensatory strategies;Staff to assist with self feeding Compensations: Minimize environmental distractions;Slow rate;Small sips/bites;Multiple dry swallows after each bite/sip;Follow solids with liquid Postural Changes and/or Swallow Maneuvers: Seated upright 90 degrees;Upright 30-60 min after meal             General recommendations:  (n/a at this time) Oral Care Recommendations: Oral care BID;Staff/trained caregiver to provide oral care Follow up Recommendations: None (suspected) Plan: Continue with current plan of care     Melvin, [place], CCC-SLP  Lawson Isabell 07/03/2015, 2:48 PM

## 2015-07-03 NOTE — Progress Notes (Signed)
Pt family requested to change diet from DYS 1 to thin liquid. Pt tolerated swallowing pills whole with applesauce.

## 2015-07-03 NOTE — Care Management Important Message (Signed)
Important Message  Patient Details  Name: Eric Glover MRN: [id number] Date of Birth: April 23, 1945   Medicare Important Message Given:  Yes    [name] 07/03/2015, 2:09 PM

## 2015-07-04 LAB — TYPE AND SCREEN
ABO/RH(D): O NEG
Antibody Screen: NEGATIVE
Unit division: 0
Unit division: 0

## 2015-07-04 LAB — GLUCOSE, CAPILLARY
GLUCOSE-CAPILLARY: 116 mg/dL — AB (ref 65–99)
GLUCOSE-CAPILLARY: 179 mg/dL — AB (ref 65–99)
GLUCOSE-CAPILLARY: 193 mg/dL — AB (ref 65–99)
GLUCOSE-CAPILLARY: 199 mg/dL — AB (ref 65–99)
GLUCOSE-CAPILLARY: 206 mg/dL — AB (ref 65–99)
Glucose-Capillary: 115 mg/dL — ABNORMAL HIGH (ref 65–99)

## 2015-07-04 LAB — BASIC METABOLIC PANEL
ANION GAP: 5 (ref 5–15)
BUN: 19 mg/dL (ref 6–20)
CALCIUM: 8 mg/dL — AB (ref 8.9–10.3)
CHLORIDE: 105 mmol/L (ref 101–111)
CO2: 25 mmol/L (ref 22–32)
Creatinine, Ser: 1.9 mg/dL — ABNORMAL HIGH (ref 0.61–1.24)
GFR calc non Af Amer: 34 mL/min — ABNORMAL LOW (ref >60)
GFR, EST AFRICAN AMERICAN: 40 mL/min — AB (ref >60)
Glucose, Bld: 130 mg/dL — ABNORMAL HIGH (ref 65–99)
Potassium: 3.7 mmol/L (ref 3.5–5.1)
Sodium: 135 mmol/L (ref 135–145)

## 2015-07-04 LAB — MAGNESIUM: Magnesium: 1.6 mg/dL — ABNORMAL LOW (ref 1.7–2.4)

## 2015-07-04 MED ORDER — LACTULOSE 10 GM/15ML PO SOLN: 20.0000 g | Freq: Every day | ORAL | Status: AC | PRN

## 2015-07-04 MED ORDER — LACTULOSE 10 GM/15ML PO SOLN
20.0000 g | Freq: Every day | ORAL | Status: DC | PRN
  Administered 2015-07-04: 20 g via ORAL
  Filled 2015-07-04: qty 30

## 2015-07-04 MED ORDER — DOCUSATE SODIUM 100 MG PO CAPS: 100.0000 mg | ORAL_CAPSULE | Freq: Two times a day (BID) | ORAL | Status: AC

## 2015-07-04 MED ORDER — MAGNESIUM SULFATE 4 GM/100ML IV SOLN
4.0000 g | Freq: Once | INTRAVENOUS | Status: AC
  Administered 2015-07-04: 4 g via INTRAVENOUS
  Filled 2015-07-04: qty 100

## 2015-07-04 MED ORDER — FENTANYL 50 MCG/HR TD PT72: 50.0000 ug | MEDICATED_PATCH | TRANSDERMAL | Status: DC

## 2015-07-04 MED ORDER — SODIUM CHLORIDE 0.9 % IV SOLN
INTRAVENOUS | Status: AC
  Administered 2015-07-04 – 2015-07-05 (×2): via INTRAVENOUS

## 2015-07-04 MED ORDER — INSULIN DETEMIR 100 UNIT/ML ~~LOC~~ SOLN: 5.0000 [IU] | Freq: Every day | SUBCUTANEOUS | Status: AC

## 2015-07-04 MED ORDER — OXYCODONE-ACETAMINOPHEN 5-325 MG PO TABS: 1.0000 | ORAL_TABLET | Freq: Four times a day (QID) | ORAL | Status: AC | PRN

## 2015-07-04 NOTE — Progress Notes (Addendum)
[place] [facility] [place] NAME: Eric Glover    MR#:  [id number]  DATE OF BIRTH:  08-18-1945  SUBJECTIVE:   Pt is awake and alert today. No complaint.  REVIEW OF SYSTEMS:  CONSTITUTIONAL: No fever, has weakness.  EYES: No blurred or double vision.  EARS, NOSE, AND THROAT: No tinnitus or ear pain.  RESPIRATORY: No cough, shortness of breath, wheezing or hemoptysis.  CARDIOVASCULAR: No chest pain, orthopnea, edema.  GASTROINTESTINAL: No nausea, vomiting, or abdominal pain. GENITOURINARY: No dysuria, hematuria.  ENDOCRINE: No polyuria, nocturia,  HEMATOLOGY: No anemia, easy bruising or bleeding SKIN: No rash or lesion. MUSCULOSKELETAL: No joint pain or arthritis.  NEUROLOGIC: No tingling, numbness, weakness.  PSYCHIATRY: No anxiety or depression.    DRUG ALLERGIES:   Allergies  Allergen Reactions  . Neomycin-Bacitracin Zn-Polymyx Rash    VITALS:  Blood pressure 163/61, pulse 71, temperature 98.4 F (36.9 C), temperature source Axillary, resp. rate 16, height 6\' 2"  (1.88 m), weight 94.394 kg (208 lb 1.6 oz), SpO2 99 %.  PHYSICAL EXAMINATION:   Physical Exam  Constitutional: He is well-developed, well-nourished, and in no distress. HENT: dry oral mucosa. Head: Normocephalic.  Eyes: No scleral icterus.  Neck: No JVD present. No tracheal deviation present.  Cardiovascular: Normal rate, regular rhythm and normal heart sounds.  Exam reveals no gallop and no friction rub.   No murmur heard. Pulmonary/Chest: Effort normal and breath sounds normal. No respiratory distress. He has no wheezes. He has no rales. He exhibits no tenderness.  Abdominal: Soft. Bowel sounds are normal. He exhibits no distension and no mass. There is no tenderness. There is no rebound and no guarding.  Musculoskeletal: Normal range of motion. He exhibits no edema.  Lymphadenopathy:    He has no cervical adenopathy.  Neurological: he knows his  name and time and location. He follows commands, no intracranial deficit.  Power 4/5 in all extremities. Sensation intact. Skin: Skin is warm. No rash noted. No erythema.      LABORATORY PANEL:   CBC  Recent Labs Lab 07/03/15 0454  WBC 7.0  HGB 8.5*  HCT 25.6*  PLT 454*   ------------------------------------------------------------------------------------------------------------------  Chemistries   Recent Labs Lab 06/29/15 1610  07/04/15 0607  NA 135  < > 135  K 4.7  < > 3.7  CL 102  < > 105  CO2 22  < > 25  GLUCOSE 143*  < > 130*  BUN 30*  < > 19  CREATININE 2.56*  < > 1.90*  CALCIUM 8.6*  < > 8.0*  MG  --   --  1.6*  AST 30  --   --   ALT 28  --   --   ALKPHOS 125  --   --   BILITOT 0.8  --   --   < > = values in this interval not displayed. ------------------------------------------------------------------------------------------------------------------  Cardiac Enzymes  Recent Labs Lab 06/29/15 1610  TROPONINI 0.11*   ------------------------------------------------------------------------------------------------------------------  RADIOLOGY:  Ct Abdomen Pelvis Wo Contrast  07/03/2015  CLINICAL DATA:  Renal cyst. EXAM: CT ABDOMEN AND PELVIS WITHOUT CONTRAST TECHNIQUE: Multidetector CT imaging of the abdomen and pelvis was performed following the standard protocol without IV contrast. Contrast could not be administered due to patient's renal failure. COMPARISON:  MRI of same day. 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 Minimal bilateral pleural effusions are noted with adjacent subsegmental atelectasis. Cholelithiasis  is noted. No focal abnormality is noted in the liver, spleen or pancreas on these unenhanced images. Adrenal glands are unremarkable. Nonobstructive calculus is noted in lower pole collecting system of right kidney. No hydronephrosis or renal obstruction is noted. No  ureteral calculi are noted. Atherosclerosis of abdominal aorta is noted. Two cysts are noted in the left kidney. In the posterior portion of the lower pole right kidney, ill-defined low density area is noted corresponds to abnormality described on prior MRI. It has ill-defined margins and possible associated calcification. There is no evidence of bowel obstruction. The appendix appears normal. No abnormal fluid collection is noted. Urinary bladder appears normal. No significant adenopathy is noted. IMPRESSION: Atherosclerosis of abdominal aorta without aneurysm formation. Findings consistent with diskitis and adjacent osteomyelitis of the L2-3 and L5-S1 as described on prior MRI. Minimal bilateral pleural effusions are noted with adjacent subsegmental atelectasis. Cholelithiasis without evidence of inflammation. Nonobstructive right renal calculus. No hydronephrosis or renal obstruction is noted. Two left renal cysts are noted as described on prior MRI. Faint ill-defined low density is seen in the posterior cortex of the lower pole of right kidney which corresponds to abnormality described on prior MRI. Given its ill-defined margins, it cannot be said with certainty that this represents benign cystic abnormality. Renal ultrasound may be performed for further evaluation to evaluate for possible neoplasm. Electronically Signed   By: Marijo Conception, M.D.   On: 07/03/2015 16:37   Mr Brain Wo Contrast  07/03/2015  CLINICAL DATA:  70 year old diabetic hypertensive male with altered mental status. Lumbar discitis. Subsequent encounter. EXAM: MRI HEAD WITHOUT CONTRAST TECHNIQUE: Multiplanar, multiecho pulse sequences of the brain and surrounding structures were obtained without intravenous contrast. COMPARISON:  06/29/2015 head CT.  05/16/2015 brain MR. FINDINGS: Exam is motion degraded.  FLAIR imaging not obtained. No acute infarct or obvious intracranial hemorrhage. Limited for evaluating for the possibility of  intracranial infection or mass given the lack of contrast administration, FLAIR imaging and the degree of motion degradation. Global atrophy without hydrocephalus. Major intracranial vascular structures are patent. IMPRESSION: No acute infarct or obvious intracranial hemorrhage. Limited for evaluating for the possibility of intracranial infection or mass given the lack of contrast administration, lack of FLAIR imaging and the degree of motion degradation. Global atrophy without hydrocephalus. Electronically Signed   By: Genia Del M.D.   On: 07/03/2015 10:36   Mr Abdomen Wo Contrast  07/03/2015  CLINICAL DATA:  Complex cystic mass in the lower pole of the right kidney on recent ultrasound exam. EXAM: MRI ABDOMEN WITHOUT CONTRAST TECHNIQUE: Multiplanar multisequence MR imaging was performed without the administration of intravenous contrast. COMPARISON:  Ultrasound exam from 06/30/2015. FINDINGS: Of note, this exam is markedly motion degraded and fine detail is obscured by the motion artifact. Lower chest:  Unremarkable. Hepatobiliary: No gross abnormality evident although liver parenchyma is largely obscured on all pulse sequences. Large gallstone measures up to 2.6 cm. Pancreas: Pancreatic parenchyma of discernible on only 1 pulse sequence. No gross pancreatic mass lesion. No definite dilatation of the main pancreatic duct. Spleen: Limit assessment shows no focal abnormality. Adrenals/Urinary Tract: 2.7 cm cystic lesion identified upper pole left kidney 3.2 cm cystic lesion identified interpolar left kidney. 2.0 cm cystic lesion identified lower pole right kidney. No hydronephrosis on either side. Stomach/Bowel: No evidence for bowel obstruction. Vascular/Lymphatic: No abdominal aortic aneurysm. No gross abdominal lymphadenopathy. Other: No substantial intraperitoneal free fluid. Musculoskeletal: There is some mild dependent body wall edema evident. Previous imaging studies  documented discitis osteomyelitis  at L2-3 and L5-S1, not well demonstrated on the current exam. IMPRESSION: 1. Markedly limited study by substantial motion degradation. Cystic lesion identified lower pole right kidney cannot be further characterized given the patient's inability to reproducibly breath hold. As such, consider CT to further evaluate given the better temporal resolution of that exam. Cystic lesions are also identified in the left kidney. 2. Cholelithiasis. 3. Changes of discitis osteomyelitis at L2-3 and L5-S1 are better demonstrated on other recent dedicated lumbar spine imaging exams. Electronically Signed   By: Misty Stanley M.D.   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  1. Acute renal failure: This may be due to ATN or related to vancomycin which she was on for discitis. improving. Renal ultrasound: There is a complex cystic mass in the lower pole of the right kidney with multiple thin septations.  Cr. Decreased to 1.9, Continue  NS iv, f/u BMP.   * Hematuria/cystic mass right kidney.  MRI showed Cystic lesion identified lower pole right kidney cannot be further characterized given the patient's inability to reproducibly breath hold.  CT abdomen: Faint ill-defined low density is seen in the posterior cortex of the lower pole of right kidney which corresponds to abnormality described on prior MRI. Given its ill-defined margins, it cannot be said with certainty that this represents benign cystic abnormality.  Follow-up urology as outpatient.  2. Acute metabolic encephalopathy: This is likely due to acute renal failure. Improved. CT head on admission showed no acute changes.  MRI brain showed no CVA or hemohrrage.  3. History of discitis:   He was on vancomycin until April 4. MRI of the lumbar spine showed worsening discitis but no abscess. Negative blood cultures so far. Per Dr. Ola Spurr, would continue levofloxacin for at 3-4 weeks.  4. Diabetes: Continue sliding scale insulin, ADA diet and Lantus.  5. Essential hypertension: Continue metoprolol and when necessary hydralazine.  6. Acute on chronic anemia: Hemoglobin decreased to 6.8. Iron deficiency per anemia panel. Started iron po.  He got 1 unit of PRBC transfusion. Hb is up to 8.5. full anemia workup as an outpatient once acute symptoms resolve per Dr. Grayland Ormond.  * Hypomagnesemia. Give IV magnesium in the follow-up level.  I discussed with Dr. Ola Spurr. Management plans discussed with the patient's wife and she is in agreement. Greater than 50% time was spent on coordination of care and face-to-face counseling.  CODE STATUS: FULL  TOTAL TIME TAKING CARE OF THIS PATIENT: 35 minutes.     POSSIBLE D/C to SNF tomorrow, DEPENDING ON CLINICAL CONDITION.   Demetrios Loll M.D on 07/04/2015 at 3:45 PM  Between 7am to 6pm - Pager - [phone] After 6pm go to [email or web address] - password [email or web address] [facility]  Office  [phone]  CC: Primary care physician; Ashok Norris, MD  Note: This dictation was prepared with Dragon dictation along with smaller phrase technology. Any transcriptional errors that result from this process are unintentional.

## 2015-07-04 NOTE — Progress Notes (Signed)
[place] INFECTIOUS DISEASE PROGRESS NOTE Date of Admission:  06/29/2015     ID: Eric Glover is a 70 y.o. male with  discitis  Active Problems:   Fall   Subjective Close to baseline mental status now. Got up with PT. Still with pain in back no nv  ROS  Eleven systems are reviewed and negative except per hpi  Medications:  Antibiotics Given (last 72 hours)    Date/Time Action Medication Dose Rate   07/02/15 1145 Given   levofloxacin (LEVAQUIN) IVPB 750 mg 750 mg 100 mL/hr   07/04/15 1101 Given   levofloxacin (LEVAQUIN) IVPB 750 mg 750 mg 100 mL/hr     . allopurinol  100 mg Oral Daily  . amiodarone  200 mg Oral Daily  . apixaban  5 mg Oral BID  . docusate sodium  100 mg Oral BID  . doxazosin  8 mg Oral QHS  . fentaNYL  50 mcg Transdermal Q72H  . ferrous sulfate  325 mg Oral BID WC  . insulin aspart  0-9 Units Subcutaneous 6 times per day  . insulin detemir  5 Units Subcutaneous QHS  . levofloxacin (LEVAQUIN) IV  750 mg Intravenous Q48H  . metoprolol tartrate  25 mg Oral BID  . pantoprazole  40 mg Oral Daily    Objective: Vital signs in last 24 hours: Temp:  [98.4 F (36.9 C)] 98.4 F (36.9 C) (04/11 0434) Pulse Rate:  [71-73] 71 (04/11 0434) Resp:  [16-17] 16 (04/11 0434) BP: (148-163)/(61-62) 163/61 mmHg (04/11 0434) SpO2:  [92 %-99 %] 99 % (04/11 0434) Weight:  [94.394 kg (208 lb 1.6 oz)] 94.394 kg (208 lb 1.6 oz) (04/11 0500) Physical Exam  Constitutional: He is oriented to person, place, and time. chroncially ill appearing HENT: anicteric,  Mouth/Throat: Oropharynx is clear and dry. No oropharyngeal exudate.  Cardiovascular: Normal rate, regular rhythm and normal heart sounds. Pulmonary/Chest: Effort normal and breath sounds normal. No respiratory distress. He has no wheezes.  Abdominal: Soft. Bowel sounds are normal. He exhibits no distension. There is no tenderness.  Lymphadenopathy:He has no cervical adenopathy.  Neurological: He is alert and  oriented to person, place, and time.  Skin: Skin is warm and dry. No rash noted. No erythema.  Psychiatric: He has a normal mood and affect. His behavior is normal.   Lab Results  Recent Labs  07/02/15 0425 07/03/15 0454 07/04/15 0607  WBC 7.8 7.0  --   HGB 6.8* 8.5*  --   HCT 20.7* 25.6*  --   NA 137 138 135  K 4.1 3.9 3.7  CL 108 109 105  CO2 25 24 25   BUN 28* 23* 19  CREATININE 2.47* 2.09* 1.90*    Microbiology: BCX negative  Studies/Results: Ct Abdomen Pelvis Wo Contrast  07/03/2015  CLINICAL DATA:  Renal cyst. EXAM: CT ABDOMEN AND PELVIS WITHOUT CONTRAST TECHNIQUE: Multidetector CT imaging of the abdomen and pelvis was performed following the standard protocol without IV contrast. Contrast could not be administered due to patient's renal failure. COMPARISON:  MRI of same day. 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 Minimal bilateral pleural effusions are noted with adjacent subsegmental atelectasis. Cholelithiasis is noted. No focal abnormality is noted in the liver, spleen or pancreas on these unenhanced images. Adrenal glands are unremarkable. Nonobstructive calculus is noted in lower pole collecting system of right kidney. No hydronephrosis or renal obstruction is noted. No ureteral calculi are noted. Atherosclerosis of  abdominal aorta is noted. Two cysts are noted in the left kidney. In the posterior portion of the lower pole right kidney, ill-defined low density area is noted corresponds to abnormality described on prior MRI. It has ill-defined margins and possible associated calcification. There is no evidence of bowel obstruction. The appendix appears normal. No abnormal fluid collection is noted. Urinary bladder appears normal. No significant adenopathy is noted. IMPRESSION: Atherosclerosis of abdominal aorta without aneurysm formation. Findings consistent with diskitis and adjacent osteomyelitis of  the L2-3 and L5-S1 as described on prior MRI. Minimal bilateral pleural effusions are noted with adjacent subsegmental atelectasis. Cholelithiasis without evidence of inflammation. Nonobstructive right renal calculus. No hydronephrosis or renal obstruction is noted. Two left renal cysts are noted as described on prior MRI. Faint ill-defined low density is seen in the posterior cortex of the lower pole of right kidney which corresponds to abnormality described on prior MRI. Given its ill-defined margins, it cannot be said with certainty that this represents benign cystic abnormality. Renal ultrasound may be performed for further evaluation to evaluate for possible neoplasm. Electronically Signed   By: Marijo Conception, M.D.   On: 07/03/2015 16:37   Mr Brain Wo Contrast  07/03/2015  CLINICAL DATA:  70 year old diabetic hypertensive male with altered mental status. Lumbar discitis. Subsequent encounter. EXAM: MRI HEAD WITHOUT CONTRAST TECHNIQUE: Multiplanar, multiecho pulse sequences of the brain and surrounding structures were obtained without intravenous contrast. COMPARISON:  06/29/2015 head CT.  05/16/2015 brain MR. FINDINGS: Exam is motion degraded.  FLAIR imaging not obtained. No acute infarct or obvious intracranial hemorrhage. Limited for evaluating for the possibility of intracranial infection or mass given the lack of contrast administration, FLAIR imaging and the degree of motion degradation. Global atrophy without hydrocephalus. Major intracranial vascular structures are patent. IMPRESSION: No acute infarct or obvious intracranial hemorrhage. Limited for evaluating for the possibility of intracranial infection or mass given the lack of contrast administration, lack of FLAIR imaging and the degree of motion degradation. Global atrophy without hydrocephalus. Electronically Signed   By: Genia Del M.D.   On: 07/03/2015 10:36   Mr Abdomen Wo Contrast  07/03/2015  CLINICAL DATA:  Complex cystic mass in  the lower pole of the right kidney on recent ultrasound exam. EXAM: MRI ABDOMEN WITHOUT CONTRAST TECHNIQUE: Multiplanar multisequence MR imaging was performed without the administration of intravenous contrast. COMPARISON:  Ultrasound exam from 06/30/2015. FINDINGS: Of note, this exam is markedly motion degraded and fine detail is obscured by the motion artifact. Lower chest:  Unremarkable. Hepatobiliary: No gross abnormality evident although liver parenchyma is largely obscured on all pulse sequences. Large gallstone measures up to 2.6 cm. Pancreas: Pancreatic parenchyma of discernible on only 1 pulse sequence. No gross pancreatic mass lesion. No definite dilatation of the main pancreatic duct. Spleen: Limit assessment shows no focal abnormality. Adrenals/Urinary Tract: 2.7 cm cystic lesion identified upper pole left kidney 3.2 cm cystic lesion identified interpolar left kidney. 2.0 cm cystic lesion identified lower pole right kidney. No hydronephrosis on either side. Stomach/Bowel: No evidence for bowel obstruction. Vascular/Lymphatic: No abdominal aortic aneurysm. No gross abdominal lymphadenopathy. Other: No substantial intraperitoneal free fluid. Musculoskeletal: There is some mild dependent body wall edema evident. Previous imaging studies documented discitis osteomyelitis at L2-3 and L5-S1, not well demonstrated on the current exam. IMPRESSION: 1. Markedly limited study by substantial motion degradation. Cystic lesion identified lower pole right kidney cannot be further characterized given the patient's inability to reproducibly breath hold. As such, consider CT to  further evaluate given the better temporal resolution of that exam. Cystic lesions are also identified in the left kidney. 2. Cholelithiasis. 3. Changes of discitis osteomyelitis at L2-3 and L5-S1 are better demonstrated on other recent dedicated lumbar spine imaging exams. Electronically Signed   By: Misty Stanley M.D.   On: 07/03/2015 10:23      Assessment/Plan: Nivek Powley is a 70 y.o. 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 He was admitted and noted to be hypotensive and tachycardic but this responded to fluid bolus. His Cr was 1.29 as otpt labs but 2.26 on admission. CTx has been held, RUQ USS shows large gallstones but no cholecystitis. He was seen by surgery who felt LFTs were not related to the gallstones. He was transitioned to IV vancomycin and was on this until 4/6 when he got his last dose of vanco and was changed to oral levofloxacin (has only received one dose). He was having some issues with dysuria at that time but ua and ucx were negative. He was admitted 4/6 with a fall and confusion. Cr was elevated. New and progressive anemia. He is very confused. MRI repeated and shows continued infection in spine with minimal improvement . He was very delirous at admission and has been getting benzos, opiates and benadryl. LFTs are normalized Ammonia nml, lactate nml. UA neg Does have renal mass noted on MRI  4/11- much more alert, no twitching. Got up with PT Cr down to 1.9  Recommendation I think he was delirious from meds and ARF. -would continue levofloxacin  PO for another 3-4 weeks  Thank you very much for the consult. Will follow with you.  Nicle Connole P   07/04/2015, 3:41 PM

## 2015-07-04 NOTE — Discharge Instructions (Signed)
Heart healthy and ADA diet. Activity as tolerated. Fall and aspiration precaution. levaquin po for 4 weeks and follow up Dr. Ola Spurr.

## 2015-07-04 NOTE — Progress Notes (Signed)
[facility]  ROUNDING NOTE   Subjective:  Renal function improving. Creatinine down to 1.90. Patient much more awake and alert today.  Objective:  Vital signs in last 24 hours:  Temp:  [98.4 F (36.9 C)] 98.4 F (36.9 C) (04/11 0434) Pulse Rate:  [71-73] 71 (04/11 0434) Resp:  [16-17] 16 (04/11 0434) BP: (148-163)/(61-62) 163/61 mmHg (04/11 0434) SpO2:  [92 %-99 %] 99 % (04/11 0434) Weight:  [94.394 kg (208 lb 1.6 oz)] 94.394 kg (208 lb 1.6 oz) (04/11 0500)  Weight change: -2.041 kg (-4 lb 8 oz) Filed Weights   07/02/15 0558 07/03/15 0403 07/04/15 0500  Weight: 97.16 kg (214 lb 3.2 oz) 96.435 kg (212 lb 9.6 oz) 94.394 kg (208 lb 1.6 oz)    Intake/Output: I/O last 3 completed shifts: In: 2648.9 [P.O.:720; I.V.:1928.9] Out: 0    Intake/Output this shift:  Total I/O In: 100 [IV Piggyback:100] Out: -   Physical Exam: General: NAD, resting in bed  Head: Normocephalic, atraumatic. Moist oral mucosal membranes  Eyes: Anicteric  Neck: Supple, trachea midline  Lungs:  Clear to auscultation normal effort  Heart: Regular rate and rhythm  Abdomen:  Soft, nontender, BS present  Extremities: trace peripheral edema.  Neurologic: Awake, alert, oriented to time/person/place  Skin: No lesions       Basic Metabolic Panel:  Recent Labs Lab 06/30/15 0440 07/01/15 0457 07/02/15 0425 07/03/15 0454 07/04/15 0607  NA 132* 135 137 138 135  K 4.8 4.6 4.1 3.9 3.7  CL 105 105 108 109 105  CO2 24 24 25 24 25   GLUCOSE 121* 151* 106* 89 130*  BUN 28* 28* 28* 23* 19  CREATININE 2.79* 2.60* 2.47* 2.09* 1.90*  CALCIUM 8.3* 8.5* 8.1* 8.1* 8.0*  MG  --   --   --   --  1.6*    Liver Function Tests:  Recent Labs Lab 06/29/15 1610  AST 30  ALT 28  ALKPHOS 125  BILITOT 0.8  PROT 6.7  ALBUMIN 2.9*   No results for input(s): LIPASE, AMYLASE in the last 168 hours.  Recent Labs Lab 06/29/15 2304  AMMONIA 26    CBC:  Recent Labs Lab 06/29/15 1610  06/30/15 0440 07/01/15 0457 07/02/15 0425 07/03/15 0454  WBC 9.9 9.4 9.5 7.8 7.0  NEUTROABS 7.6*  --   --   --   --   HGB 8.3* 7.3* 7.3* 6.8* 8.5*  HCT 25.2* 22.5* 22.0* 20.7* 25.6*  MCV 79.1* 78.5* 79.3* 78.7* 79.5*  PLT 554* 505* 480* 431 454*    Cardiac Enzymes:  Recent Labs Lab 06/29/15 1610  TROPONINI 0.11*    BNP: Invalid input(s): POCBNP  CBG:  Recent Labs Lab 07/03/15 1958 07/03/15 2357 07/04/15 0430 07/04/15 0753 07/04/15 1211  GLUCAP 181* 205* 116* 115* 70*    Microbiology: Results for orders placed or performed during the hospital encounter of 06/29/15  Blood culture (routine x 2)     Status: None (Preliminary result)   Collection Time: 06/29/15  4:10 PM  Result Value Ref Range Status   Specimen Description BLOOD LEFT ARM  Final   Special Requests BOTTLES DRAWN AEROBIC AND ANAEROBIC  1CC  Final   Culture NO GROWTH 4 DAYS  Final   Report Status PENDING  Incomplete  Blood culture (routine x 2)     Status: None (Preliminary result)   Collection Time: 06/29/15  4:11 PM  Result Value Ref Range Status   Specimen Description BLOOD RIGHT ASSIST CONTROL  Final  Special Requests BOTTLES DRAWN AEROBIC AND ANAEROBIC  14CC  Final   Culture NO GROWTH 4 DAYS  Final   Report Status PENDING  Incomplete    Coagulation Studies:  Recent Labs  07/02/15 0552  LABPROT 22.3*  INR 1.97    Urinalysis: No results for input(s): COLORURINE, LABSPEC, PHURINE, GLUCOSEU, HGBUR, BILIRUBINUR, KETONESUR, PROTEINUR, UROBILINOGEN, NITRITE, LEUKOCYTESUR in the last 72 hours.  Invalid input(s): APPERANCEUR    Imaging: Ct Abdomen Pelvis Wo Contrast  07/03/2015  CLINICAL DATA:  Renal cyst. EXAM: CT ABDOMEN AND PELVIS WITHOUT CONTRAST TECHNIQUE: Multidetector CT imaging of the abdomen and pelvis was performed following the standard protocol without IV contrast. Contrast could not be administered due to patient's renal failure. COMPARISON:  MRI of same day. FINDINGS: Findings  consistent with diskitis and adjacent osteomyelitis at L2-3 and L5-S1 as described on prior MRI. Minimal bilateral pleural effusions are noted with adjacent subsegmental atelectasis. Cholelithiasis is noted. No focal abnormality is noted in the liver, spleen or pancreas on these unenhanced images. Adrenal glands are unremarkable. Nonobstructive calculus is noted in lower pole collecting system of right kidney. No hydronephrosis or renal obstruction is noted. No ureteral calculi are noted. Atherosclerosis of abdominal aorta is noted. Two cysts are noted in the left kidney. In the posterior portion of the lower pole right kidney, ill-defined low density area is noted corresponds to abnormality described on prior MRI. It has ill-defined margins and possible associated calcification. There is no evidence of bowel obstruction. The appendix appears normal. No abnormal fluid collection is noted. Urinary bladder appears normal. No significant adenopathy is noted. IMPRESSION: Atherosclerosis of abdominal aorta without aneurysm formation. Findings consistent with diskitis and adjacent osteomyelitis of the L2-3 and L5-S1 as described on prior MRI. Minimal bilateral pleural effusions are noted with adjacent subsegmental atelectasis. Cholelithiasis without evidence of inflammation. Nonobstructive right renal calculus. No hydronephrosis or renal obstruction is noted. Two left renal cysts are noted as described on prior MRI. Faint ill-defined low density is seen in the posterior cortex of the lower pole of right kidney which corresponds to abnormality described on prior MRI. Given its ill-defined margins, it cannot be said with certainty that this represents benign cystic abnormality. Renal ultrasound may be performed for further evaluation to evaluate for possible neoplasm. Electronically Signed   By: Marijo Conception, M.D.   On: 07/03/2015 16:37   Mr Brain Wo Contrast  07/03/2015  CLINICAL DATA:  70 year old diabetic  hypertensive male with altered mental status. Lumbar discitis. Subsequent encounter. EXAM: MRI HEAD WITHOUT CONTRAST TECHNIQUE: Multiplanar, multiecho pulse sequences of the brain and surrounding structures were obtained without intravenous contrast. COMPARISON:  06/29/2015 head CT.  05/16/2015 brain MR. FINDINGS: Exam is motion degraded.  FLAIR imaging not obtained. No acute infarct or obvious intracranial hemorrhage. Limited for evaluating for the possibility of intracranial infection or mass given the lack of contrast administration, FLAIR imaging and the degree of motion degradation. Global atrophy without hydrocephalus. Major intracranial vascular structures are patent. IMPRESSION: No acute infarct or obvious intracranial hemorrhage. Limited for evaluating for the possibility of intracranial infection or mass given the lack of contrast administration, lack of FLAIR imaging and the degree of motion degradation. Global atrophy without hydrocephalus. Electronically Signed   By: Genia Del M.D.   On: 07/03/2015 10:36   Mr Abdomen Wo Contrast  07/03/2015  CLINICAL DATA:  Complex cystic mass in the lower pole of the right kidney on recent ultrasound exam. EXAM: MRI ABDOMEN WITHOUT CONTRAST TECHNIQUE: Multiplanar  multisequence MR imaging was performed without the administration of intravenous contrast. COMPARISON:  Ultrasound exam from 06/30/2015. FINDINGS: Of note, this exam is markedly motion degraded and fine detail is obscured by the motion artifact. Lower chest:  Unremarkable. Hepatobiliary: No gross abnormality evident although liver parenchyma is largely obscured on all pulse sequences. Large gallstone measures up to 2.6 cm. Pancreas: Pancreatic parenchyma of discernible on only 1 pulse sequence. No gross pancreatic mass lesion. No definite dilatation of the main pancreatic duct. Spleen: Limit assessment shows no focal abnormality. Adrenals/Urinary Tract: 2.7 cm cystic lesion identified upper pole left  kidney 3.2 cm cystic lesion identified interpolar left kidney. 2.0 cm cystic lesion identified lower pole right kidney. No hydronephrosis on either side. Stomach/Bowel: No evidence for bowel obstruction. Vascular/Lymphatic: No abdominal aortic aneurysm. No gross abdominal lymphadenopathy. Other: No substantial intraperitoneal free fluid. Musculoskeletal: There is some mild dependent body wall edema evident. Previous imaging studies documented discitis osteomyelitis at L2-3 and L5-S1, not well demonstrated on the current exam. IMPRESSION: 1. Markedly limited study by substantial motion degradation. Cystic lesion identified lower pole right kidney cannot be further characterized given the patient's inability to reproducibly breath hold. As such, consider CT to further evaluate given the better temporal resolution of that exam. Cystic lesions are also identified in the left kidney. 2. Cholelithiasis. 3. Changes of discitis osteomyelitis at L2-3 and L5-S1 are better demonstrated on other recent dedicated lumbar spine imaging exams. Electronically Signed   By: Misty Stanley M.D.   On: 07/03/2015 10:23     Medications:   . sodium chloride     . allopurinol  100 mg Oral Daily  . amiodarone  200 mg Oral Daily  . apixaban  5 mg Oral BID  . docusate sodium  100 mg Oral BID  . doxazosin  8 mg Oral QHS  . fentaNYL  50 mcg Transdermal Q72H  . ferrous sulfate  325 mg Oral BID WC  . insulin aspart  0-9 Units Subcutaneous 6 times per day  . insulin detemir  5 Units Subcutaneous QHS  . levofloxacin (LEVAQUIN) IV  750 mg Intravenous Q48H  . metoprolol tartrate  25 mg Oral BID  . pantoprazole  40 mg Oral Daily   acetaminophen **OR** acetaminophen, albuterol, bisacodyl, budesonide (PULMICORT) nebulizer solution, fluticasone, HYDROcodone-acetaminophen, lactulose, morphine injection, ondansetron **OR** ondansetron (ZOFRAN) IV, polyethylene glycol, sodium chloride flush  Assessment/ Plan:  71 y.o. male with a PMHX  of DM-2, HTN, diskitis treated with iv VANC, history of acute right pulmonary embolus on 05/11/2015, was admitted on 06/29/2015 with weakness, altered mental status and acute renal failure.   1. Acute renal failure, baseline Cr 0.94 from 06/16/15. Likely secondary to ATN from concurrent infection or vancomycin toxicity -Vancomycin level high at 30.  - renal function continues to improve.  Creatinine down to 1.90.  We would recommend continued monitoring of renal function as an outpatient.  2. 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  3. Hematuria/cystic mass right kidney: Appreciate urology opinion by Dr. Tresa Moore. Agree that patient has significant comorbidities and he would have significant surgical risk. MRI abdomen completed howeversignificant motion artifact noted.  Patient also had CT scan of the abdomen and pelvis which showed ill-defined low density in the posterior cortex of the lower pole of the right kidney  Given the fact that it was ill-defined he'll need serial renal ultrasounds for followup.  We advised him to followup with urology for this issue.  LOS: 5 Eric Glover 4/11/20174:14 PM

## 2015-07-04 NOTE — NC FL2 (Signed)
[place] LEVEL OF CARE SCREENING TOOL     IDENTIFICATION  Patient Name: Eric Glover Birthdate: 1945/12/17 Sex: male Admission Date (Current Location): 06/29/2015  [place] and [place] Number:  Engineering geologist and Address:  [facility], [address], [place], [place] [address]      Provider Number: [id number]  Attending Physician Name and Address:  Demetrios Loll, MD  Relative Name and Phone Number:       Current Level of Care: Hospital Recommended Level of Care: [place] Prior Approval Number:    Date Approved/Denied:   PASRR Number: [id number]  Discharge Plan: SNF    Current Diagnoses: Patient Active Problem List   Diagnosis Date Noted  . Fall 06/29/2015  . Acute liver failure   . Cholelithiasis without cholecystitis   . Elevated LFTs   . Acute renal failure ([place]) 06/13/2015  . Pressure ulcer 06/13/2015  . A-fib ([place]) 05/30/2015  . Bacteremia due to Gram-positive bacteria 05/24/2015  . DDD (degenerative disc disease), lumbar 05/09/2015  . Sciatica associated with disorder of lumbar spine 05/09/2015  . HTN (hypertension) 05/06/2015  . Acute on chronic kidney failure ([place]) 05/06/2015  . Low back pain 05/06/2015  . Diabetes mellitus with neuropathy ([place]) 01/11/2015  . Benign hypertensive kidney disease 05/31/2010  . Chronic kidney disease (CKD), stage III (moderate) 05/31/2010  . Gout 05/31/2010  . HLD (hyperlipidemia) 05/31/2010  . Type 2 diabetes mellitus ([place]) 05/31/2010    Orientation RESPIRATION BLADDER Height & Weight     Self, Time, Situation  Normal Incontinent Weight: 208 lb 1.6 oz (94.394 kg) Height:  6\' 2"  (188 cm)  BEHAVIORAL SYMPTOMS/MOOD NEUROLOGICAL BOWEL NUTRITION STATUS      Continent Diet (DYS 1, Thin Liquids)  AMBULATORY STATUS COMMUNICATION OF NEEDS Skin   Limited Assist   Normal                       Personal Care Assistance Level of Assistance  Bathing, Feeding,  Dressing Bathing Assistance: Limited assistance Feeding assistance: Independent Dressing Assistance: Limited assistance     Functional Limitations Info  Sight, Hearing, Speech Sight Info: Adequate Hearing Info: Adequate Speech Info: Adequate    SPECIAL CARE FACTORS FREQUENCY  PT (By licensed PT), Speech therapy     PT Frequency: 5       Speech Therapy Frequency: 5      Contractures      Additional Factors Info  Allergies, Code Status Code Status Info: Full Code Allergies Info: Allergies: Neomycin-bacitracin Zn-polymyx           Current Medications (07/04/2015):  This is the current hospital active medication list Current Facility-Administered Medications  Medication Dose Route Frequency Provider Last Rate Last Dose  . acetaminophen (TYLENOL) tablet 650 mg  650 mg Oral Q6H PRN Hillary Bow, MD       Or  . acetaminophen (TYLENOL) suppository 650 mg  650 mg Rectal Q6H PRN Srikar Sudini, MD      . albuterol (PROVENTIL) (2.5 MG/3ML) 0.083% nebulizer solution 2.5 mg  2.5 mg Nebulization Q2H PRN Srikar Sudini, MD      . allopurinol (ZYLOPRIM) tablet 100 mg  100 mg Oral Daily Bettey Costa, MD   100 mg at 07/04/15 0847  . amiodarone (PACERONE) tablet 200 mg  200 mg Oral Daily Hillary Bow, MD   200 mg at 07/04/15 0847  . apixaban (ELIQUIS) tablet 5 mg  5 mg Oral BID Hillary Bow, MD  5 mg at 07/04/15 0847  . bisacodyl (DULCOLAX) suppository 10 mg  10 mg Rectal Daily PRN Srikar Sudini, MD      . budesonide (PULMICORT) nebulizer solution 0.25 mg  0.25 mg Nebulization BID PRN Srikar Sudini, MD      . docusate sodium (COLACE) capsule 100 mg  100 mg Oral BID Hillary Bow, MD   100 mg at 07/04/15 0857  . doxazosin (CARDURA) tablet 8 mg  8 mg Oral QHS Hillary Bow, MD   8 mg at 07/03/15 2322  . fentaNYL (DURAGESIC - dosed mcg/hr) patch 50 mcg  50 mcg Transdermal Q72H Hillary Bow, MD   50 mcg at 07/03/15 0109  . ferrous sulfate tablet 325 mg  325 mg Oral BID WC Demetrios Loll, MD   325  mg at 07/04/15 0847  . fluticasone (FLONASE) 50 MCG/ACT nasal spray 1-2 spray  1-2 spray Each Nare Daily PRN Hillary Bow, MD      . HYDROcodone-acetaminophen (NORCO/VICODIN) 5-325 MG per tablet 1-2 tablet  1-2 tablet Oral Q4H PRN Hillary Bow, MD   2 tablet at 07/04/15 0848  . insulin aspart (novoLOG) injection 0-9 Units  0-9 Units Subcutaneous 6 times per day Hillary Bow, MD   3 Units at 07/04/15 0023  . insulin detemir (LEVEMIR) injection 5 Units  5 Units Subcutaneous QHS Hillary Bow, MD   5 Units at 07/03/15 2321  . lactulose (CHRONULAC) 10 GM/15ML solution 20 g  20 g Oral Daily PRN Demetrios Loll, MD   20 g at 07/04/15 0847  . levofloxacin (LEVAQUIN) IVPB 750 mg  750 mg Intravenous Q48H Srikar Sudini, MD   750 mg at 07/02/15 1145  . LORazepam (ATIVAN) injection 1 mg  1 mg Intravenous Q6H PRN Hillary Bow, MD   1 mg at 07/03/15 0856  . magnesium sulfate IVPB 4 g 100 mL  4 g Intravenous Once Demetrios Loll, MD   4 g at 07/04/15 0847  . metoprolol tartrate (LOPRESSOR) tablet 25 mg  25 mg Oral BID Bettey Costa, MD   25 mg at 07/04/15 0847  . morphine 2 MG/ML injection 2 mg  2 mg Intravenous Q4H PRN Hillary Bow, MD   2 mg at 06/30/15 1742  . ondansetron (ZOFRAN) tablet 4 mg  4 mg Oral Q6H PRN Hillary Bow, MD       Or  . ondansetron (ZOFRAN) injection 4 mg  4 mg Intravenous Q6H PRN Hillary Bow, MD   4 mg at 06/30/15 1742  . pantoprazole (PROTONIX) EC tablet 40 mg  40 mg Oral Daily Bettey Costa, MD   40 mg at 07/04/15 0847  . polyethylene glycol (MIRALAX / GLYCOLAX) packet 17 g  17 g Oral Daily PRN Hillary Bow, MD   17 g at 07/03/15 1742  . sodium chloride flush (NS) 0.9 % injection 10-40 mL  10-40 mL Intracatheter PRN Hillary Bow, MD         Discharge Medications: Please see discharge summary for a list of discharge medications.  Relevant Imaging Results:  Relevant Lab Results:   Additional Information SSN:  [id number]  Darden Dates, LCSW

## 2015-07-05 ENCOUNTER — Encounter
Admission: RE | Admit: 2015-07-05 | Discharge: 2015-07-05 | Disposition: A | Discharge status: Home / Self Care | Payer: Medicare Other | Source: Ambulatory Visit | Attending: Internal Medicine | Admitting: Internal Medicine

## 2015-07-05 DIAGNOSIS — E119 Type 2 diabetes mellitus without complications: Secondary | ICD-10-CM | POA: Insufficient documentation

## 2015-07-05 DIAGNOSIS — D649 Anemia, unspecified: Secondary | ICD-10-CM | POA: Insufficient documentation

## 2015-07-05 DIAGNOSIS — N179 Acute kidney failure, unspecified: Secondary | ICD-10-CM | POA: Insufficient documentation

## 2015-07-05 LAB — GLUCOSE, CAPILLARY
GLUCOSE-CAPILLARY: 154 mg/dL — AB (ref 65–99)
GLUCOSE-CAPILLARY: 117 mg/dL — AB (ref 65–99)
Glucose-Capillary: 168 mg/dL — ABNORMAL HIGH (ref 65–99)
Glucose-Capillary: 162 mg/dL — ABNORMAL HIGH (ref 65–99)
Glucose-Capillary: 190 mg/dL — ABNORMAL HIGH (ref 65–99)

## 2015-07-05 LAB — BASIC METABOLIC PANEL
Anion gap: 4 — ABNORMAL LOW (ref 5–15)
BUN: 16 mg/dL (ref 6–20)
CHLORIDE: 105 mmol/L (ref 101–111)
CO2: 26 mmol/L (ref 22–32)
Calcium: 8.3 mg/dL — ABNORMAL LOW (ref 8.9–10.3)
Creatinine, Ser: 1.81 mg/dL — ABNORMAL HIGH (ref 0.61–1.24)
GFR calc Af Amer: 42 mL/min — ABNORMAL LOW (ref >60)
GFR calc non Af Amer: 36 mL/min — ABNORMAL LOW (ref >60)
GLUCOSE: 158 mg/dL — AB (ref 65–99)
Potassium: 3.9 mmol/L (ref 3.5–5.1)
Sodium: 135 mmol/L (ref 135–145)

## 2015-07-05 LAB — MAGNESIUM: Magnesium: 1.8 mg/dL (ref 1.7–2.4)

## 2015-07-05 NOTE — Progress Notes (Signed)
[facility]  ROUNDING NOTE   Subjective:  Renal function continues to improve. Creatinine down to 1.8.   Objective:  Vital signs in last 24 hours:  Temp:  [97.6 F (36.4 C)-97.8 F (36.6 C)] 97.8 F (36.6 C) (04/12 0405) Pulse Rate:  [67-70] 70 (04/12 0959) Resp:  [18-20] 20 (04/12 0405) BP: (128-172)/(67-95) 172/76 mmHg (04/12 0959) SpO2:  [94 %-98 %] 94 % (04/12 0405) Weight:  [96.026 kg (211 lb 11.2 oz)] 96.026 kg (211 lb 11.2 oz) (04/12 0500)  Weight change: 1.633 kg (3 lb 9.6 oz) Filed Weights   07/03/15 0403 07/04/15 0500 07/05/15 0500  Weight: 96.435 kg (212 lb 9.6 oz) 94.394 kg (208 lb 1.6 oz) 96.026 kg (211 lb 11.2 oz)    Intake/Output: I/O last 3 completed shifts: In: 2761 [P.O.:480; I.V.:2181; IV Piggyback:100] Out: 0    Intake/Output this shift:  Total I/O In: 240 [P.O.:240] Out: 0   Physical Exam: General: NAD, resting in bed  Head: Normocephalic, atraumatic. Moist oral mucosal membranes  Eyes: Anicteric  Neck: Supple, trachea midline  Lungs:  Clear to auscultation normal effort  Heart: Regular rate and rhythm  Abdomen:  Soft, nontender, BS present  Extremities: trace peripheral edema.  Neurologic: Awake, alert, oriented to time/person/place  Skin: No lesions       Basic Metabolic Panel:  Recent Labs Lab 07/01/15 0457 07/02/15 0425 07/03/15 0454 07/04/15 0607 07/05/15 0404  NA 135 137 138 135 135  K 4.6 4.1 3.9 3.7 3.9  CL 105 108 109 105 105  CO2 24 25 24 25 26   GLUCOSE 151* 106* 89 130* 158*  BUN 28* 28* 23* 19 16  CREATININE 2.60* 2.47* 2.09* 1.90* 1.81*  CALCIUM 8.5* 8.1* 8.1* 8.0* 8.3*  MG  --   --   --  1.6* 1.8    Liver Function Tests:  Recent Labs Lab 06/29/15 1610  AST 30  ALT 28  ALKPHOS 125  BILITOT 0.8  PROT 6.7  ALBUMIN 2.9*   No results for input(s): LIPASE, AMYLASE in the last 168 hours.  Recent Labs Lab 06/29/15 2304  AMMONIA 26    CBC:  Recent Labs Lab 06/29/15 1610 06/30/15 0440  07/01/15 0457 07/02/15 0425 07/03/15 0454  WBC 9.9 9.4 9.5 7.8 7.0  NEUTROABS 7.6*  --   --   --   --   HGB 8.3* 7.3* 7.3* 6.8* 8.5*  HCT 25.2* 22.5* 22.0* 20.7* 25.6*  MCV 79.1* 78.5* 79.3* 78.7* 79.5*  PLT 554* 505* 480* 431 454*    Cardiac Enzymes:  Recent Labs Lab 06/29/15 1610  TROPONINI 0.11*    BNP: Invalid input(s): POCBNP  CBG:  Recent Labs Lab 07/04/15 1944 07/04/15 2342 07/05/15 0102 07/05/15 0408 07/05/15 0747  GLUCAP 193* 179* 168* 154* 117*    Microbiology: Results for orders placed or performed during the hospital encounter of 06/29/15  Blood culture (routine x 2)     Status: None (Preliminary result)   Collection Time: 06/29/15  4:10 PM  Result Value Ref Range Status   Specimen Description BLOOD LEFT ARM  Final   Special Requests BOTTLES DRAWN AEROBIC AND ANAEROBIC  1CC  Final   Culture NO GROWTH 4 DAYS  Final   Report Status PENDING  Incomplete  Blood culture (routine x 2)     Status: None (Preliminary result)   Collection Time: 06/29/15  4:11 PM  Result Value Ref Range Status   Specimen Description BLOOD RIGHT ASSIST CONTROL  Final  Special Requests BOTTLES DRAWN AEROBIC AND ANAEROBIC  14CC  Final   Culture NO GROWTH 4 DAYS  Final   Report Status PENDING  Incomplete    Coagulation Studies: No results for input(s): LABPROT, INR in the last 72 hours.  Urinalysis: No results for input(s): COLORURINE, LABSPEC, PHURINE, GLUCOSEU, HGBUR, BILIRUBINUR, KETONESUR, PROTEINUR, UROBILINOGEN, NITRITE, LEUKOCYTESUR in the last 72 hours.  Invalid input(s): APPERANCEUR    Imaging: Ct Abdomen Pelvis Wo Contrast  07/03/2015  CLINICAL DATA:  Renal cyst. EXAM: CT ABDOMEN AND PELVIS WITHOUT CONTRAST TECHNIQUE: Multidetector CT imaging of the abdomen and pelvis was performed following the standard protocol without IV contrast. Contrast could not be administered due to patient's renal failure. COMPARISON:  MRI of same day. FINDINGS: Findings consistent with  diskitis and adjacent osteomyelitis at L2-3 and L5-S1 as described on prior MRI. Minimal bilateral pleural effusions are noted with adjacent subsegmental atelectasis. Cholelithiasis is noted. No focal abnormality is noted in the liver, spleen or pancreas on these unenhanced images. Adrenal glands are unremarkable. Nonobstructive calculus is noted in lower pole collecting system of right kidney. No hydronephrosis or renal obstruction is noted. No ureteral calculi are noted. Atherosclerosis of abdominal aorta is noted. Two cysts are noted in the left kidney. In the posterior portion of the lower pole right kidney, ill-defined low density area is noted corresponds to abnormality described on prior MRI. It has ill-defined margins and possible associated calcification. There is no evidence of bowel obstruction. The appendix appears normal. No abnormal fluid collection is noted. Urinary bladder appears normal. No significant adenopathy is noted. IMPRESSION: Atherosclerosis of abdominal aorta without aneurysm formation. Findings consistent with diskitis and adjacent osteomyelitis of the L2-3 and L5-S1 as described on prior MRI. Minimal bilateral pleural effusions are noted with adjacent subsegmental atelectasis. Cholelithiasis without evidence of inflammation. Nonobstructive right renal calculus. No hydronephrosis or renal obstruction is noted. Two left renal cysts are noted as described on prior MRI. Faint ill-defined low density is seen in the posterior cortex of the lower pole of right kidney which corresponds to abnormality described on prior MRI. Given its ill-defined margins, it cannot be said with certainty that this represents benign cystic abnormality. Renal ultrasound may be performed for further evaluation to evaluate for possible neoplasm. Electronically Signed   By: Marijo Conception, M.D.   On: 07/03/2015 16:37     Medications:   . sodium chloride 75 mL/hr at 07/05/15 0532   . allopurinol  100 mg Oral  Daily  . amiodarone  200 mg Oral Daily  . apixaban  5 mg Oral BID  . docusate sodium  100 mg Oral BID  . doxazosin  8 mg Oral QHS  . fentaNYL  50 mcg Transdermal Q72H  . ferrous sulfate  325 mg Oral BID WC  . insulin aspart  0-9 Units Subcutaneous 6 times per day  . insulin detemir  5 Units Subcutaneous QHS  . levofloxacin (LEVAQUIN) IV  750 mg Intravenous Q48H  . metoprolol tartrate  25 mg Oral BID  . pantoprazole  40 mg Oral Daily   acetaminophen **OR** acetaminophen, albuterol, bisacodyl, budesonide (PULMICORT) nebulizer solution, fluticasone, HYDROcodone-acetaminophen, lactulose, morphine injection, ondansetron **OR** ondansetron (ZOFRAN) IV, polyethylene glycol, sodium chloride flush  Assessment/ Plan:  70 y.o. male with a PMHX of DM-2, HTN, diskitis treated with iv VANC, history of acute right pulmonary embolus on 05/11/2015, was admitted on 06/29/2015 with weakness, altered mental status and acute renal failure.   1. Acute renal failure, baseline Cr  0.94 from 06/16/15. Likely secondary to ATN from concurrent infection or vancomycin toxicity -Vancomycin level high at 30.  -creatinine down to 1.8 today. Patient will need continued monitoring of his renal function as an outpatient.  2. 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  3. Hematuria/cystic mass right kidney: Appreciate urology opinion by Dr. Tresa Moore. Agree that patient has significant comorbidities and he would have significant surgical risk. MRI abdomen completed howeversignificant motion artifact noted.  Patient also had CT scan of the abdomen and pelvis which showed ill-defined low density in the posterior cortex of the lower pole of the right kidney  Given the fact that it was ill-defined he'll need serial renal ultrasounds for followup.  We advised him to followup with urology for this issue.     LOS: 6 Bolden Hagerman 4/12/201711:25 AM

## 2015-07-05 NOTE — Clinical Social Work Note (Signed)
Pt is ready for discharge today. CSW spoke with RN at the [place] and pt is able to go to any facility for STR that accepts his insurance however the [place] would not cover anything. CSW provided bed offers. Pt chose [employer] [facility] ran [employer] check and pt has full benefit for STR and Medicare will cover at 100% for 20 days as pt has had a 3 night qualifying stay. Pt and wife are in agreement and appreciative of CSW support. CSW updated RN, who will call report. [facility] will provide transportation. Pt's discharge is pending a BM. Facility is aware. CSW also updated RNCM. CSW is signing off as no further needs identified.   Darden Dates, MSW, LCSW Clinical Social Worker  [phone]

## 2015-07-05 NOTE — Progress Notes (Signed)
[facility] PROGRESS NOTE Date of Admission:  06/29/2015     ID: Eric Glover is a 70 y.o. male with  discitis  Active Problems:   Fall   Subjective Very alert today, eating, less pain  ROS  Eleven systems are reviewed and negative except per hpi  Medications:  Antibiotics Given (last 72 hours)    Date/Time Action Medication Dose Rate   07/04/15 1101 Given   levofloxacin (LEVAQUIN) IVPB 750 mg 750 mg 100 mL/hr     . allopurinol  100 mg Oral Daily  . amiodarone  200 mg Oral Daily  . apixaban  5 mg Oral BID  . docusate sodium  100 mg Oral BID  . doxazosin  8 mg Oral QHS  . fentaNYL  50 mcg Transdermal Q72H  . ferrous sulfate  325 mg Oral BID WC  . insulin aspart  0-9 Units Subcutaneous 6 times per day  . insulin detemir  5 Units Subcutaneous QHS  . levofloxacin (LEVAQUIN) IV  750 mg Intravenous Q48H  . metoprolol tartrate  25 mg Oral BID  . pantoprazole  40 mg Oral Daily    Objective: Vital signs in last 24 hours: Temp:  [97.6 F (36.4 C)-97.8 F (36.6 C)] 97.8 F (36.6 C) (04/12 0405) Pulse Rate:  [67-70] 70 (04/12 0959) Resp:  [18-20] 20 (04/12 0405) BP: (128-172)/(67-95) 172/76 mmHg (04/12 0959) SpO2:  [94 %-98 %] 94 % (04/12 0405) Weight:  [96.026 kg (211 lb 11.2 oz)] 96.026 kg (211 lb 11.2 oz) (04/12 0500) Physical Exam  Constitutional: He is oriented to person, place, and time. chroncially ill appearing HENT: anicteric,  Mouth/Throat: Oropharynx is clear and dry. No oropharyngeal exudate.  Cardiovascular: Normal rate, regular rhythm and normal heart sounds. Pulmonary/Chest: Effort normal and breath sounds normal. No respiratory distress. He has no wheezes.  Abdominal: Soft. Bowel sounds are normal. He exhibits no distension. There is no tenderness.  Lymphadenopathy:He has no cervical adenopathy.  Neurological: He is alert and oriented to person, place, and time.  Skin: Skin is warm and dry. No rash noted. No erythema.  Psychiatric:  He has a normal mood and affect. His behavior is normal.   Lab Results  Recent Labs  07/03/15 0454 07/04/15 0607 07/05/15 0404  WBC 7.0  --   --   HGB 8.5*  --   --   HCT 25.6*  --   --   NA 138 135 135  K 3.9 3.7 3.9  CL 109 105 105  CO2 _0 BUN 23* 19 16  CREATININE 2.09* 1.90* 1.81*    Microbiology: BCX negative  Studies/Results: Ct Abdomen Pelvis Wo Contrast  07/03/2015  CLINICAL DATA:  Renal cyst. EXAM: CT ABDOMEN AND PELVIS WITHOUT CONTRAST TECHNIQUE: Multidetector CT imaging of the abdomen and pelvis was performed following the standard protocol without IV contrast. Contrast could not be administered due to patient's renal failure. COMPARISON:  MRI of same day. 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 Minimal bilateral pleural effusions are noted with adjacent subsegmental atelectasis. Cholelithiasis is noted. No focal abnormality is noted in the liver, spleen or pancreas on these unenhanced images. Adrenal glands are unremarkable. Nonobstructive calculus is noted in lower pole collecting system of right kidney. No hydronephrosis or renal obstruction is noted. No ureteral calculi are noted. Atherosclerosis of abdominal aorta is noted. Two cysts are noted in the left kidney. In the posterior portion of the  lower pole right kidney, ill-defined low density area is noted corresponds to abnormality described on prior MRI. It has ill-defined margins and possible associated calcification. There is no evidence of bowel obstruction. The appendix appears normal. No abnormal fluid collection is noted. Urinary bladder appears normal. No significant adenopathy is noted. IMPRESSION: Atherosclerosis of abdominal aorta without aneurysm formation. Findings consistent with diskitis and adjacent osteomyelitis of the L2-3 and L5-S1 as described on prior MRI. Minimal bilateral pleural effusions are noted with adjacent  subsegmental atelectasis. Cholelithiasis without evidence of inflammation. Nonobstructive right renal calculus. No hydronephrosis or renal obstruction is noted. Two left renal cysts are noted as described on prior MRI. Faint ill-defined low density is seen in the posterior cortex of the lower pole of right kidney which corresponds to abnormality described on prior MRI. Given its ill-defined margins, it cannot be said with certainty that this represents benign cystic abnormality. Renal ultrasound may be performed for further evaluation to evaluate for possible neoplasm. Electronically Signed   By: Marijo Conception, M.D.   On: 07/03/2015 16:37     Assessment/Plan: Eric Glover is a 70 y.o. 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 He was admitted and noted to be hypotensive and tachycardic but this responded to fluid bolus. His Cr was 1.29 as otpt labs but 2.26 on admission. CTx has been held, RUQ USS shows large gallstones but no cholecystitis. He was seen by surgery who felt LFTs were not related to the gallstones. He was transitioned to IV vancomycin and was on this until 4/6 when he got his last dose of vanco and was changed to oral levofloxacin (has only received one dose). He was having some issues with dysuria at that time but ua and ucx were negative. He was admitted 4/6 with a fall and confusion. Cr was elevated. New and progressive anemia. He is very confused. MRI repeated and shows continued infection in spine with minimal improvement . He was very delirous at admission and has been getting benzos, opiates and benadryl. LFTs are normalized Ammonia nml, lactate nml. UA neg Does have renal mass noted on MRI  4/11-  much more alert, no twitching. Got up with PT Cr down to 1.9  4/12 - really at baseline now- much improved  Recommendation I think he was delirious from meds and ARF. -would continue levofloxacin  PO for another 3-4 weeks  I can see in fu in 3 weeks time [place] Thank you very much for the consult. Will follow with you.  Josiah Nieto P   07/05/2015, 2:39 PM

## 2015-07-05 NOTE — Discharge Summary (Signed)
[place] [facility] [place] NAME: Eric Glover    MR#:  [id number]  DATE OF BIRTH:  1945-10-11  DATE OF ADMISSION:  06/29/2015 ADMITTING PHYSICIAN: Hillary Bow, MD  DATE OF DISCHARGE: 07/05/2015 PRIMARY CARE PHYSICIAN: Ashok Norris, MD    ADMISSION DIAGNOSIS:  Incontinence [R32] AKI (acute kidney injury) ([place]) [N17.9] Urinary incontinence [R32]   DISCHARGE DIAGNOSIS:  Acute renal failure: This may be due to ATN or related to vancomycin  Hematuria/cystic mass right kidney. Acute metabolic encephalopathy: SECONDARY DIAGNOSIS:   Past Medical History  Diagnosis Date  . Diabetes mellitus without complication ([place])   . GERD (gastroesophageal reflux disease)   . Hyperlipidemia   . Hypertension   . CKD (chronic kidney disease), stage III   . Gout   . Staph infection 2017  . Discitis of lumbosacral region   . Discitis of lumbosacral region   . Pulmonary embolism ([place])   . Atrial fibrillation [facility])     HOSPITAL COURSE:   70 year old male with a history of diabetes, essential hypertension and Streptococcus mutans discitis and bacteremia on vancomycin and since April 4 changed to [place] who is admitted to the hospital with confusion and weakness and found to have acute renal failure.  1. Acute renal failure: This may be due to ATN or related to vancomycin which she was on for discitis. improving. Renal ultrasound: There is a complex cystic mass in the lower pole of the right kidney with multiple thin septations.  Cr. Decreased to 1.81, improving with NS iv, f/u BMP as outpatient.   * Hematuria/cystic mass right kidney. MRI showed Cystic lesion identified lower pole right kidney cannot be further characterized given the patient's inability to reproducibly breath hold.  CT abdomen: Faint ill-defined low density is seen in the posterior cortex of the lower pole of right kidney which corresponds to abnormality described on  prior MRI. Given its ill-defined margins, it cannot be said with certainty that this represents benign cystic abnormality.  Follow-up urology as outpatient.  2. Acute metabolic encephalopathy: This is likely due to acute renal failure. Improved. CT head on admission showed no acute changes. MRI brain showed no CVA or hemohrrage.  3. History of discitis: He was on vancomycin until April 4. MRI of the lumbar spine showed worsening discitis but no abscess. Negative blood cultures so far. Per Dr. Ola Spurr, would continue levofloxacin for at 3-4 weeks.  4. Diabetes: Continue sliding scale insulin, ADA diet and Lantus.  5. Essential hypertension: Continue metoprolol and when necessary hydralazine.  6. Acute on chronic anemia: Hemoglobin decreased to 6.8. Iron deficiency per anemia panel. Started iron po.  He got 1 unit of PRBC transfusion. Hb is up to 8.5. full anemia workup as an outpatient once acute symptoms resolve per Dr. Grayland Ormond.  * Hypomagnesemia. Give IV magnesium and improved.  DISCHARGE CONDITIONS:   Stable, discharge to SNF today.  CONSULTS OBTAINED:  Treatment Team:  Murlean Iba, MD Leonel Ramsay, MD Alexis Frock, MD Lloyd Huger, MD  DRUG ALLERGIES:   Allergies  Allergen Reactions  . Ativan [Lorazepam] Other (See Comments)    Confusion, twitching  . Neomycin-Bacitracin Zn-Polymyx Rash    DISCHARGE MEDICATIONS:   Current Discharge Medication List    START taking these medications   Details  docusate sodium (COLACE) 100 MG capsule Take 1 capsule (100 mg total) by mouth 2 (two) times daily. Qty: 10 capsule, Refills: 0    lactulose (CHRONULAC) 10 GM/15ML solution  Take 30 mLs (20 g total) by mouth daily as needed for mild constipation. Qty: 240 mL, Refills: 0      CONTINUE these medications which have CHANGED   Details  fentaNYL (DURAGESIC - DOSED MCG/HR) 50 MCG/HR Place 1 patch (50 mcg total) onto the skin every 3 (three) days. Qty: 5  patch, Refills: 0   Associated Diagnoses: Discitis of lumbar region    insulin detemir (LEVEMIR) 100 UNIT/ML injection Inject 0.05 mLs (5 Units total) into the skin at bedtime. Qty: 10 mL, Refills: 11    oxyCODONE-acetaminophen (PERCOCET) 5-325 MG tablet Take 1 tablet by mouth every 6 (six) hours as needed for severe pain. Qty: 20 tablet, Refills: 0   Associated Diagnoses: Discitis of lumbar region      CONTINUE these medications which have NOT CHANGED   Details  allopurinol (ZYLOPRIM) 100 MG tablet Take 100 mg by mouth daily.     amiodarone (PACERONE) 200 MG tablet Take 1 tablet (200 mg total) by mouth daily. Qty: 30 tablet, Refills: 2    apixaban (ELIQUIS) 5 MG TABS tablet Take 1 tablet (5 mg total) by mouth 2 (two) times daily. Qty: 60 tablet, Refills: 2    aspirin EC 81 MG tablet Take 81 mg by mouth at bedtime.     beclomethasone (QVAR) 80 MCG/ACT inhaler Inhale 2 puffs into the lungs 2 (two) times daily as needed (for shortness of breath).    colchicine (COLCRYS) 0.6 MG tablet Take 0.6 mg by mouth daily as needed (for gout flares).     doxazosin (CARDURA) 8 MG tablet Take 1 tablet by mouth at bedtime.    ferrous sulfate 325 (65 FE) MG tablet Take 1 tablet by mouth daily.    fluticasone (FLONASE) 50 MCG/ACT nasal spray Place 1-2 sprays into both nostrils daily as needed for rhinitis.    insulin aspart (NOVOLOG) 100 UNIT/ML injection Inject 4-12 Units into the skin 3 (three) times daily with meals as needed for high blood sugar. Pt uses as needed per sliding scale.    ipratropium (ATROVENT) 0.03 % nasal spray Place 1 spray into both nostrils every 12 (twelve) hours. Refills: 12    lansoprazole (PREVACID) 15 MG capsule Take 15 mg by mouth daily.     levofloxacin (LEVAQUIN) 750 MG tablet Take 1 tablet by mouth daily.    lidocaine (LIDODERM) 5 % Place 1 patch onto the skin daily. Remove & Discard patch within 12 hours or as directed by MD Qty: 30 patch, Refills: 0     metoprolol tartrate (LOPRESSOR) 25 MG tablet Take 1 tablet (25 mg total) by mouth 2 (two) times daily. Qty: 60 tablet, Refills: 0    nystatin (MYCOSTATIN) 100000 UNIT/ML suspension Take 5 mLs (500,000 Units total) by mouth 4 (four) times daily. Qty: 60 mL, Refills: 0    polyethylene glycol (MIRALAX / GLYCOLAX) packet Take 17 g by mouth daily as needed for mild constipation.    propranolol ER (INDERAL LA) 80 MG 24 hr capsule Take 1 capsule by mouth daily.    vitamin B-12 (CYANOCOBALAMIN) 500 MCG tablet Take 500 mcg by mouth daily.       STOP taking these medications     furosemide (LASIX) 20 MG tablet      oxyCODONE (OXY IR/ROXICODONE) 5 MG immediate release tablet      phenazopyridine (PYRIDIUM) 100 MG tablet      simvastatin (ZOCOR) 80 MG tablet          DISCHARGE INSTRUCTIONS:  If you experience worsening of your admission symptoms, develop shortness of breath, life threatening emergency, suicidal or homicidal thoughts you must seek medical attention immediately by calling 911 or calling your MD immediately  if symptoms less severe.  You Must read complete instructions/literature along with all the possible adverse reactions/side effects for all the Medicines you take and that have been prescribed to you. Take any new Medicines after you have completely understood and accept all the possible adverse reactions/side effects.   Please note  You were cared for by a hospitalist during your hospital stay. If you have any questions about your discharge medications or the care you received while you were in the hospital after you are discharged, you can call the unit and asked to speak with the hospitalist on call if the hospitalist that took care of you is not available. Once you are discharged, your primary care physician will handle any further medical issues. Please note that NO REFILLS for any discharge medications will be authorized once you are discharged, as it is imperative  that you return to your primary care physician (or establish a relationship with a primary care physician if you do not have one) for your aftercare needs so that they can reassess your need for medications and monitor your lab values.    Today   SUBJECTIVE   No complaint.   VITAL SIGNS:  Blood pressure 172/76, pulse 70, temperature 97.8 F (36.6 C), temperature source Oral, resp. rate 20, height 6\' 2"  (1.88 m), weight 96.026 kg (211 lb 11.2 oz), SpO2 94 %.  I/O:   Intake/Output Summary (Last 24 hours) at 07/05/15 1318 Last data filed at 07/05/15 1100  Gross per 24 hour  Intake   1224 ml  Output      0 ml  Net   1224 ml    PHYSICAL EXAMINATION:  GENERAL:  70 y.o.-year-old patient lying in the bed with no acute distress.  EYES: Pupils equal, round, reactive to light and accommodation. No scleral icterus. Extraocular muscles intact.  HEENT: Head atraumatic, normocephalic. Oropharynx and nasopharynx clear.  NECK:  Supple, no jugular venous distention. No thyroid enlargement, no tenderness.  LUNGS: Normal breath sounds bilaterally, no wheezing, rales,rhonchi or crepitation. No use of accessory muscles of respiration.  CARDIOVASCULAR: S1, S2 normal. No murmurs, rubs, or gallops.  ABDOMEN: Soft, non-tender, non-distended. Bowel sounds present. No organomegaly or mass.  EXTREMITIES: No pedal edema, cyanosis, or clubbing.  NEUROLOGIC: Cranial nerves II through XII are intact. Muscle strength 4/5 in all extremities. Sensation intact. Gait not checked.  PSYCHIATRIC: The patient is alert and oriented x 3.  SKIN: No obvious rash, lesion, or ulcer.   DATA REVIEW:   CBC  Recent Labs Lab 07/03/15 0454  WBC 7.0  HGB 8.5*  HCT 25.6*  PLT 454*    Chemistries   Recent Labs Lab 06/29/15 1610  07/05/15 0404  NA 135  < > 135  K 4.7  < > 3.9  CL 102  < > 105  CO2 22  < > 26  GLUCOSE 143*  < > 158*  BUN 30*  < > 16  CREATININE 2.56*  < > 1.81*  CALCIUM 8.6*  < > 8.3*  MG  --    < > 1.8  AST 30  --   --   ALT 28  --   --   ALKPHOS 125  --   --   BILITOT 0.8  --   --   < > =  values in this interval not displayed.  Cardiac Enzymes  Recent Labs Lab 06/29/15 1610  TROPONINI 0.11*    Microbiology Results  Results for orders placed or performed during the hospital encounter of 06/29/15  Blood culture (routine x 2)     Status: None (Preliminary result)   Collection Time: 06/29/15  4:10 PM  Result Value Ref Range Status   Specimen Description BLOOD LEFT ARM  Final   Special Requests BOTTLES DRAWN AEROBIC AND ANAEROBIC  1CC  Final   Culture NO GROWTH 4 DAYS  Final   Report Status PENDING  Incomplete  Blood culture (routine x 2)     Status: None (Preliminary result)   Collection Time: 06/29/15  4:11 PM  Result Value Ref Range Status   Specimen Description BLOOD RIGHT ASSIST CONTROL  Final   Special Requests BOTTLES DRAWN AEROBIC AND ANAEROBIC  14CC  Final   Culture NO GROWTH 4 DAYS  Final   Report Status PENDING  Incomplete    RADIOLOGY:  Ct Abdomen Pelvis Wo Contrast  07/03/2015  CLINICAL DATA:  Renal cyst. EXAM: CT ABDOMEN AND PELVIS WITHOUT CONTRAST TECHNIQUE: Multidetector CT imaging of the abdomen and pelvis was performed following the standard protocol without IV contrast. Contrast could not be administered due to patient's renal failure. COMPARISON:  MRI of same day. 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 Minimal bilateral pleural effusions are noted with adjacent subsegmental atelectasis. Cholelithiasis is noted. No focal abnormality is noted in the liver, spleen or pancreas on these unenhanced images. Adrenal glands are unremarkable. Nonobstructive calculus is noted in lower pole collecting system of right kidney. No hydronephrosis or renal obstruction is noted. No ureteral calculi are noted. Atherosclerosis of abdominal aorta is noted. Two cysts are noted in the left kidney. In  the posterior portion of the lower pole right kidney, ill-defined low density area is noted corresponds to abnormality described on prior MRI. It has ill-defined margins and possible associated calcification. There is no evidence of bowel obstruction. The appendix appears normal. No abnormal fluid collection is noted. Urinary bladder appears normal. No significant adenopathy is noted. IMPRESSION: Atherosclerosis of abdominal aorta without aneurysm formation. Findings consistent with diskitis and adjacent osteomyelitis of the L2-3 and L5-S1 as described on prior MRI. Minimal bilateral pleural effusions are noted with adjacent subsegmental atelectasis. Cholelithiasis without evidence of inflammation. Nonobstructive right renal calculus. No hydronephrosis or renal obstruction is noted. Two left renal cysts are noted as described on prior MRI. Faint ill-defined low density is seen in the posterior cortex of the lower pole of right kidney which corresponds to abnormality described on prior MRI. Given its ill-defined margins, it cannot be said with certainty that this represents benign cystic abnormality. Renal ultrasound may be performed for further evaluation to evaluate for possible neoplasm. Electronically Signed   By: Marijo Conception, M.D.   On: 07/03/2015 16:37        Management plans discussed with the patient, his wife and they are in agreement.  CODE STATUS:     Code Status Orders        Start     Ordered   06/29/15 2119  Full code   Continuous     06/29/15 2120    Code Status History    Date Active Date Inactive Code Status Order ID Comments User Context   06/13/2015  8:41 PM 06/16/2015  3:52 PM Full Code [id number]  Loletha Grayer, MD  ED   05/06/2015 11:47 PM 05/17/2015  8:09 PM Full Code [id number]  Lance Coon, MD Inpatient      TOTAL TIME TAKING CARE OF THIS PATIENT: 36 minutes.    Demetrios Loll M.D on 07/05/2015 at 1:18 PM  Between 7am to 6pm - Pager - [phone]  After 6pm go to  [email or web address] - password [email or web address] [facility]  Office  [phone]  CC: Primary care physician; Ashok Norris, MD

## 2015-07-05 NOTE — Clinical Social Work Placement (Signed)
   CLINICAL SOCIAL WORK PLACEMENT  NOTE  Date:  07/05/2015  Patient Details  Name: Eric Glover MRN: [id number] Date of Birth: [birth year]  Clinical Social Work is seeking post-discharge placement for this patient at the [place] level of care (*CSW will initial, date and re-position this form in  chart as items are completed):  Yes   Patient/family provided with [facility] Work Department's list of facilities offering this level of care within the geographic area requested by the patient (or if unable, by the patient's family).  Yes   Patient/family informed of their freedom to choose among providers that offer the needed level of care, that participate in Medicare, Medicaid or managed care program needed by the patient, have an available bed and are willing to accept the patient.  Yes   Patient/family informed of 's ownership interest in [facility] and [facility], as well as of the fact that they are under no obligation to receive care at these facilities.  PASRR submitted to EDS on 07/04/15     PASRR number received on 07/04/15     Existing PASRR number confirmed on       FL2 transmitted to all facilities in geographic area requested by pt/family on 07/04/15     FL2 transmitted to all facilities within larger geographic area on       Patient informed that his/her managed care company has contracts with or will negotiate with certain facilities, including the following:        Yes   Patient/family informed of bed offers received.  Patient [facility] bed at [facility]     Physician recommends and patient chooses bed at  [facility])    Patient to be transferred to [facility] on 07/05/15.  Patient to be transferred to facility by [facility]     Patient family notified on 07/05/15 of transfer.  Name of family member notified:  Bethena Roys, wife and pt     PHYSICIAN       Additional Comment:     _______________________________________________ Darden Dates, LCSW 07/05/2015, 4:46 PM

## 2015-07-05 NOTE — Care Management (Signed)
DC to skilled nursing order present but patient has not had a BM

## 2015-07-05 NOTE — Care Management (Signed)
Patient and wife asked to speak with social worker.  CM met with couple.  Wife states that she is aware that [facility] is out of network for [facility] benefits.  She says she knows patient can stay 100 days and medicare pay for it.  Clarified that medicare does not pay for 100 days at 100%.  Informed her the first 20 days is 100% as long as it remains skilled- then the coverage is 80%.  Explained the "medicare benefit period"    S.he asks if patient could discharge home with home health and informed - yes.  Discussed that there is a discharge order for today so would need to make a decision.  Wife continued to discuss how it would be better for her to have patient at [facility] and not in [place] or [place] because it is too far for her to drive and patient will "shut down" if he does not see her everyday.  Before care manager could finish this note, wife called CM back to say she wants patient to go to [place].  CM informed her that CSW had already been working on that option 

## 2015-07-05 NOTE — Progress Notes (Signed)
Pharmacy Antibiotic Note  Eric Glover is a 70 y.o. male admitted on 06/29/2015 with diskitis.  Pharmacy has been consulted for Levaquin dosing.  Plan: Continue levaquin 750mg  IV Q48H. Renal function improving, will continue to follow and adjust dose as indicated.   Height: 6\' 2"  (188 cm) Weight: 211 lb 11.2 oz (96.026 kg) IBW/kg (Calculated) : 82.2  Temp (24hrs), Avg:97.7 F (36.5 C), Min:97.6 F (36.4 C), Max:97.8 F (36.6 C)   Recent Labs Lab 06/29/15 1610 06/30/15 0440 07/01/15 0457 07/01/15 1600 07/02/15 0425 07/03/15 0454 07/04/15 0607 07/05/15 0404  WBC 9.9 9.4 9.5  --  7.8 7.0  --   --   CREATININE 2.56* 2.79* 2.60*  --  2.47* 2.09* 1.90* 1.81*  LATICACIDVEN 1.2  --   --   --   --   --   --   --   VANCORANDOM  --   --   --  30  --   --   --   --     Estimated Creatinine Clearance: 44.8 mL/min (by C-G formula based on Cr of 1.81).    Allergies  Allergen Reactions  . Ativan [Lorazepam] Other (See Comments)    Confusion, twitching  . Neomycin-Bacitracin Zn-Polymyx Rash    Antimicrobials this admission: Levaquin  >>    >>   Dose adjustments this admission:   Microbiology results: 4/6 [id number]   UA: LE(-) NO2 (+) WBC 0  Thank you for allowing pharmacy to be a part of this patient's care.  Savalas Monje C 07/05/2015 9:09 AM

## 2015-07-05 NOTE — Clinical Social Work Note (Signed)
Clinical Social Work Assessment  Patient Details  Name: Eric Glover MRN: [id number] Date of Birth: [birth year]  Date of referral:  07/04/15               Reason for consult:  Facility Placement                Permission sought to share information with:  Family Supports Permission granted to share information::  Yes, Verbal Permission Granted  Name::     Wife, judy   Housing/Transportation Living arrangements for the past 2 months:  [place] of Information:  Patient Patient Interpreter Needed:  None Criminal Activity/Legal Involvement Pertinent to Current Situation/Hospitalization:  No - Comment as needed Significant Relationships:  Other Family Members, Spouse Lives with:  Spouse Do you feel safe going back to the place where you live?  Yes Need for family participation in patient care:  Yes (Comment)  Care giving concerns:  No care giving concerns identified.   Social Worker assessment / plan:  CSW met with pt and family to address consult for New SNF as recommended by PT. CSW introduced herself and explained role of social work. CSW also explained the process of discharging to SNF with Medicare Part A. Pt's wife would like for pt to go to [facility] for STR. CSW initiated as bed search for STR in [place]. CSW will follow up with bed offers. Pt is also 70% service connected with the [place]. [facility]W followed up with [facility] regarding [facility]. CSW sent referral for STR. CSW will continue to follow.   Employment status:  Retired Forensic scientist:  Medicare, [facility] Benefit (70% service connected. ) PT Recommendations:   SNF Information / Referral to community resources:  [place]  Patient/Family's Response to care:  Pt and wife were appreciative of CSW support.   Patient/Family's Understanding of and Emotional Response to Diagnosis, Current Treatment, and Prognosis:  Pt and wife understand that pt needs STR at SNF.   Emotional  Assessment Appearance:  Appears stated age Attitude/Demeanor/Rapport:  Other (Appropriate) Affect (typically observed):  Accepting, Adaptable, Pleasant Orientation:  Oriented to Self, Oriented to Place, Oriented to  Time Alcohol / Substance use:  Never Used Psych involvement (Current and /or in the community):  No (Comment)  Discharge Needs  Concerns to be addressed:  Adjustment to Illness Readmission within the last 30 days:  No Current discharge risk:  Cognitively Impaired Barriers to Discharge:  No Barriers Identified   Darden Dates, LCSW 07/05/2015, 4:27 PM

## 2015-07-05 NOTE — Care Management Important Message (Signed)
Important Message  Patient Details  Name: Eric Glover MRN: [id number] Date of Birth: [birth year]   Medicare Important Message Given:  Yes    Juliann Pulse A Blaise Grieshaber 07/05/2015, 12:19 PM

## 2015-07-06 LAB — CULTURE, BLOOD (ROUTINE X 2)
CULTURE: NO GROWTH
CULTURE: NO GROWTH
CULTURE: NO GROWTH
Culture: NO GROWTH

## 2015-07-06 LAB — GLUCOSE, CAPILLARY
GLUCOSE-CAPILLARY: 213 mg/dL — AB (ref 65–99)
GLUCOSE-CAPILLARY: 204 mg/dL — AB (ref 65–99)
GLUCOSE-CAPILLARY: 259 mg/dL — AB (ref 65–99)
Glucose-Capillary: 234 mg/dL — ABNORMAL HIGH (ref 65–99)
Glucose-Capillary: 184 mg/dL — ABNORMAL HIGH (ref 65–99)

## 2015-07-07 LAB — GLUCOSE, CAPILLARY
GLUCOSE-CAPILLARY: 195 mg/dL — AB (ref 65–99)
GLUCOSE-CAPILLARY: 233 mg/dL — AB (ref 65–99)
GLUCOSE-CAPILLARY: 211 mg/dL — AB (ref 65–99)
Glucose-Capillary: 208 mg/dL — ABNORMAL HIGH (ref 65–99)

## 2015-07-08 LAB — GLUCOSE, CAPILLARY
GLUCOSE-CAPILLARY: 234 mg/dL — AB (ref 65–99)
GLUCOSE-CAPILLARY: 220 mg/dL — AB (ref 65–99)
Glucose-Capillary: 297 mg/dL — ABNORMAL HIGH (ref 65–99)
Glucose-Capillary: 191 mg/dL — ABNORMAL HIGH (ref 65–99)

## 2015-07-09 LAB — GLUCOSE, CAPILLARY
GLUCOSE-CAPILLARY: 288 mg/dL — AB (ref 65–99)
Glucose-Capillary: 299 mg/dL — ABNORMAL HIGH (ref 65–99)
Glucose-Capillary: 241 mg/dL — ABNORMAL HIGH (ref 65–99)
Glucose-Capillary: 383 mg/dL — ABNORMAL HIGH (ref 65–99)

## 2015-07-10 LAB — GLUCOSE, CAPILLARY
GLUCOSE-CAPILLARY: 300 mg/dL — AB (ref 65–99)
Glucose-Capillary: 369 mg/dL — ABNORMAL HIGH (ref 65–99)
Glucose-Capillary: 134 mg/dL — ABNORMAL HIGH (ref 65–99)
Glucose-Capillary: 161 mg/dL — ABNORMAL HIGH (ref 65–99)

## 2015-07-11 LAB — GLUCOSE, CAPILLARY
GLUCOSE-CAPILLARY: 256 mg/dL — AB (ref 65–99)
GLUCOSE-CAPILLARY: 224 mg/dL — AB (ref 65–99)
Glucose-Capillary: 287 mg/dL — ABNORMAL HIGH (ref 65–99)

## 2015-07-11 LAB — COMPREHENSIVE METABOLIC PANEL
ALT: 14 U/L — AB (ref 17–63)
AST: 17 U/L (ref 15–41)
Albumin: 2.9 g/dL — ABNORMAL LOW (ref 3.5–5.0)
Alkaline Phosphatase: 77 U/L (ref 38–126)
Anion gap: 7 (ref 5–15)
BUN: 22 mg/dL — AB (ref 6–20)
CHLORIDE: 100 mmol/L — AB (ref 101–111)
CO2: 29 mmol/L (ref 22–32)
Calcium: 8.8 mg/dL — ABNORMAL LOW (ref 8.9–10.3)
Creatinine, Ser: 1.8 mg/dL — ABNORMAL HIGH (ref 0.61–1.24)
GFR, EST AFRICAN AMERICAN: 43 mL/min — AB (ref >60)
GFR, EST NON AFRICAN AMERICAN: 37 mL/min — AB (ref >60)
Glucose, Bld: 276 mg/dL — ABNORMAL HIGH (ref 65–99)
POTASSIUM: 3.9 mmol/L (ref 3.5–5.1)
SODIUM: 136 mmol/L (ref 135–145)
Total Bilirubin: 0.5 mg/dL (ref 0.3–1.2)
Total Protein: 6.3 g/dL — ABNORMAL LOW (ref 6.5–8.1)

## 2015-07-11 LAB — CBC WITH DIFFERENTIAL/PLATELET
BASOS ABS: 0.1 10*3/uL (ref 0–0.1)
Basophils Relative: 1 %
EOS ABS: 0.5 10*3/uL (ref 0–0.7)
EOS PCT: 7 %
HCT: 31.9 % — ABNORMAL LOW (ref 40.0–52.0)
Hemoglobin: 10.4 g/dL — ABNORMAL LOW (ref 13.0–18.0)
LYMPHS ABS: 1.6 10*3/uL (ref 1.0–3.6)
LYMPHS PCT: 20 %
MCH: 25.9 pg — ABNORMAL LOW (ref 26.0–34.0)
MCHC: 32.7 g/dL (ref 32.0–36.0)
MCV: 79.2 fL — AB (ref 80.0–100.0)
MONO ABS: 0.7 10*3/uL (ref 0.2–1.0)
Monocytes Relative: 9 %
Neutro Abs: 5 10*3/uL (ref 1.4–6.5)
Neutrophils Relative %: 63 %
PLATELETS: 422 10*3/uL (ref 150–440)
RBC: 4.03 MIL/uL — AB (ref 4.40–5.90)
RDW: 17 % — AB (ref 11.5–14.5)
WBC: 7.8 10*3/uL (ref 3.8–10.6)

## 2015-07-12 LAB — GLUCOSE, CAPILLARY
GLUCOSE-CAPILLARY: 315 mg/dL — AB (ref 65–99)
GLUCOSE-CAPILLARY: 354 mg/dL — AB (ref 65–99)
Glucose-Capillary: 211 mg/dL — ABNORMAL HIGH (ref 65–99)
Glucose-Capillary: 192 mg/dL — ABNORMAL HIGH (ref 65–99)
Glucose-Capillary: 215 mg/dL — ABNORMAL HIGH (ref 65–99)

## 2015-07-13 ENCOUNTER — Other Ambulatory Visit: Payer: Self-pay

## 2015-07-13 ENCOUNTER — Ambulatory Visit
Admission: RE | Admit: 2015-07-13 | Discharge: 2015-07-13 | Disposition: A | Discharge status: Home / Self Care | Payer: Federal, State, Local not specified - PPO | Source: Ambulatory Visit | Attending: Gerontology | Admitting: Gerontology

## 2015-07-13 ENCOUNTER — Encounter: Payer: Self-pay | Admitting: Emergency Medicine

## 2015-07-13 ENCOUNTER — Emergency Department
Admission: EM | Admit: 2015-07-13 | Discharge: 2015-07-13 | Disposition: A | Discharge status: Skilled Nursing Facility | Payer: Federal, State, Local not specified - PPO | Attending: Emergency Medicine | Admitting: Emergency Medicine

## 2015-07-13 ENCOUNTER — Other Ambulatory Visit: Payer: Self-pay | Admitting: Gerontology

## 2015-07-13 DIAGNOSIS — I129 Hypertensive chronic kidney disease with stage 1 through stage 4 chronic kidney disease, or unspecified chronic kidney disease: Secondary | ICD-10-CM | POA: Diagnosis not present

## 2015-07-13 DIAGNOSIS — T148XXA Other injury of unspecified body region, initial encounter: Secondary | ICD-10-CM

## 2015-07-13 DIAGNOSIS — N189 Chronic kidney disease, unspecified: Secondary | ICD-10-CM | POA: Diagnosis not present

## 2015-07-13 DIAGNOSIS — E119 Type 2 diabetes mellitus without complications: Secondary | ICD-10-CM | POA: Insufficient documentation

## 2015-07-13 DIAGNOSIS — S0990XA Unspecified injury of head, initial encounter: Secondary | ICD-10-CM

## 2015-07-13 DIAGNOSIS — R112 Nausea with vomiting, unspecified: Secondary | ICD-10-CM

## 2015-07-13 DIAGNOSIS — Y999 Unspecified external cause status: Secondary | ICD-10-CM | POA: Insufficient documentation

## 2015-07-13 DIAGNOSIS — Z794 Long term (current) use of insulin: Secondary | ICD-10-CM | POA: Diagnosis not present

## 2015-07-13 DIAGNOSIS — R4182 Altered mental status, unspecified: Secondary | ICD-10-CM | POA: Diagnosis present

## 2015-07-13 DIAGNOSIS — W0110XA Fall on same level from slipping, tripping and stumbling with subsequent striking against unspecified object, initial encounter: Secondary | ICD-10-CM | POA: Diagnosis not present

## 2015-07-13 DIAGNOSIS — Y9389 Activity, other specified: Secondary | ICD-10-CM | POA: Insufficient documentation

## 2015-07-13 DIAGNOSIS — S0093XA Contusion of unspecified part of head, initial encounter: Secondary | ICD-10-CM | POA: Insufficient documentation

## 2015-07-13 DIAGNOSIS — W19XXXA Unspecified fall, initial encounter: Secondary | ICD-10-CM | POA: Insufficient documentation

## 2015-07-13 DIAGNOSIS — Y92192 Bathroom in other specified residential institution as the place of occurrence of the external cause: Secondary | ICD-10-CM | POA: Diagnosis not present

## 2015-07-13 LAB — URINALYSIS COMPLETE WITH MICROSCOPIC (ARMC ONLY)
Bacteria, UA: NONE SEEN
Bilirubin Urine: NEGATIVE
Hgb urine dipstick: NEGATIVE
Ketones, ur: NEGATIVE mg/dL
Leukocytes, UA: NEGATIVE
Nitrite: NEGATIVE
Protein, ur: 100 mg/dL — AB
SPECIFIC GRAVITY, URINE: 1.016 (ref 1.005–1.030)
pH: 5 (ref 5.0–8.0)

## 2015-07-13 LAB — CBC WITH DIFFERENTIAL/PLATELET
BASOS ABS: 0.1 10*3/uL (ref 0–0.1)
BASOS PCT: 1 %
EOS PCT: 1 %
Eosinophils Absolute: 0.1 10*3/uL (ref 0–0.7)
HCT: 29.1 % — ABNORMAL LOW (ref 40.0–52.0)
Hemoglobin: 9.3 g/dL — ABNORMAL LOW (ref 13.0–18.0)
Lymphocytes Relative: 7 %
Lymphs Abs: 0.9 10*3/uL — ABNORMAL LOW (ref 1.0–3.6)
MCH: 25.5 pg — ABNORMAL LOW (ref 26.0–34.0)
MCHC: 32.1 g/dL (ref 32.0–36.0)
MCV: 79.4 fL — ABNORMAL LOW (ref 80.0–100.0)
MONO ABS: 0.8 10*3/uL (ref 0.2–1.0)
Monocytes Relative: 6 %
Neutro Abs: 10.7 10*3/uL — ABNORMAL HIGH (ref 1.4–6.5)
Neutrophils Relative %: 85 %
PLATELETS: 478 10*3/uL — AB (ref 150–440)
RBC: 3.66 MIL/uL — ABNORMAL LOW (ref 4.40–5.90)
RDW: 17.5 % — AB (ref 11.5–14.5)
WBC: 12.6 10*3/uL — ABNORMAL HIGH (ref 3.8–10.6)

## 2015-07-13 LAB — COMPREHENSIVE METABOLIC PANEL
ALK PHOS: 78 U/L (ref 38–126)
ALT: 13 U/L — ABNORMAL LOW (ref 17–63)
ANION GAP: 7 (ref 5–15)
AST: 19 U/L (ref 15–41)
Albumin: 2.9 g/dL — ABNORMAL LOW (ref 3.5–5.0)
BUN: 28 mg/dL — ABNORMAL HIGH (ref 6–20)
CALCIUM: 9 mg/dL (ref 8.9–10.3)
CO2: 29 mmol/L (ref 22–32)
Chloride: 98 mmol/L — ABNORMAL LOW (ref 101–111)
Creatinine, Ser: 1.89 mg/dL — ABNORMAL HIGH (ref 0.61–1.24)
GFR calc non Af Amer: 35 mL/min — ABNORMAL LOW (ref >60)
GFR, EST AFRICAN AMERICAN: 40 mL/min — AB (ref >60)
Glucose, Bld: 273 mg/dL — ABNORMAL HIGH (ref 65–99)
Potassium: 4.5 mmol/L (ref 3.5–5.1)
SODIUM: 134 mmol/L — AB (ref 135–145)
Total Bilirubin: 0.8 mg/dL (ref 0.3–1.2)
Total Protein: 6.3 g/dL — ABNORMAL LOW (ref 6.5–8.1)

## 2015-07-13 LAB — GLUCOSE, CAPILLARY
GLUCOSE-CAPILLARY: 295 mg/dL — AB (ref 65–99)
GLUCOSE-CAPILLARY: 223 mg/dL — AB (ref 65–99)
GLUCOSE-CAPILLARY: 287 mg/dL — AB (ref 65–99)
GLUCOSE-CAPILLARY: 248 mg/dL — AB (ref 65–99)

## 2015-07-13 LAB — SEDIMENTATION RATE: SED RATE: 59 mm/h — AB (ref 0–20)

## 2015-07-13 LAB — MAGNESIUM: Magnesium: 2.2 mg/dL (ref 1.7–2.4)

## 2015-07-13 LAB — AMMONIA: AMMONIA: 17 umol/L (ref 9–35)

## 2015-07-13 MED ORDER — METOCLOPRAMIDE HCL 5 MG PO TABS: 5.0000 mg | ORAL_TABLET | Freq: Three times a day (TID) | ORAL | Status: AC | PRN

## 2015-07-13 NOTE — ED Notes (Signed)
Pt to ed via ems from CT scan.  Pt fell this am at the [place].  Pt states he fell backwards hitting the back of his head.  Pt was then transported to [facility] for CT scan.  After the CT scan, pt was brought to ed for altered mental status.  Pt alert but laying still with eyes closed.  Per family pt is sluggish and slow to respond.

## 2015-07-13 NOTE — ED Provider Notes (Signed)
[facility] Emergency Department Provider Note  ____________________________________________  Time seen: Approximately 5 PM  I have reviewed the triage vital signs and the nursing notes.   HISTORY  Chief Complaint Altered Mental Status   HPI Eric Glover is a 70 y.o. male with a recent history of bacteremia and weakness requiring rehabilitation was presenting to the emergency department after a fall. Per the patient he was going to the bathroom and his socks without his walker when he slipped and fell backwards. He does not report any loss of consciousness. He says he has mild pain to the back of his head. He was brought over to the hospital for a CAT scan but then thought to be "not acting like himself." His wife is at the bedside at this time and says that he is now back to his baseline. She says that he was without his glasses as well as his hearing aids and this may have been contributing to him "not acting like himself." The patient is denying any chest pain or shortness of breath and also denying that he passed out.   Past Medical History  Diagnosis Date  . Diabetes mellitus without complication ([place])   . GERD (gastroesophageal reflux disease)   . Hyperlipidemia   . Hypertension   . CKD (chronic kidney disease), stage III   . Gout   . Staph infection 2017  . Discitis of lumbosacral region   . Discitis of lumbosacral region   . Pulmonary embolism ([place])   . Atrial fibrillation [facility])     Patient Active Problem List   Diagnosis Date Noted  . Fall 06/29/2015  . Acute liver failure   . Cholelithiasis without cholecystitis   . Elevated LFTs   . Acute renal failure ([place]) 06/13/2015  . Pressure ulcer 06/13/2015  . A-fib ([place]) 05/30/2015  . Bacteremia due to Gram-positive bacteria 05/24/2015  . DDD (degenerative disc disease), lumbar 05/09/2015  . Sciatica associated with disorder of lumbar spine 05/09/2015  . HTN (hypertension) 05/06/2015  .  Acute on chronic kidney failure ([place]) 05/06/2015  . Low back pain 05/06/2015  . Diabetes mellitus with neuropathy ([place]) 01/11/2015  . Benign hypertensive kidney disease 05/31/2010  . Chronic kidney disease (CKD), stage III (moderate) 05/31/2010  . Gout 05/31/2010  . HLD (hyperlipidemia) 05/31/2010  . Type 2 diabetes mellitus ([place]) 05/31/2010    Past Surgical History  Procedure Laterality Date  . Shoulder arthroscopy w/ rotator cuff repair    . Tee without cardioversion N/A 05/15/2015    Procedure: TRANSESOPHAGEAL ECHOCARDIOGRAM (TEE);  Surgeon: Teodoro Spray, MD;  Location: [facility] ORS;  Service: Cardiovascular;  Laterality: N/A;    Current Outpatient Rx  Name  Route  Sig  Dispense  Refill  . allopurinol (ZYLOPRIM) 100 MG tablet   Oral   Take 100 mg by mouth daily.          Marland Kitchen amiodarone (PACERONE) 200 MG tablet   Oral   Take 1 tablet (200 mg total) by mouth daily.   30 tablet   2   . apixaban (ELIQUIS) 5 MG TABS tablet   Oral   Take 1 tablet (5 mg total) by mouth 2 (two) times daily.   60 tablet   2   . aspirin EC 81 MG tablet   Oral   Take 81 mg by mouth at bedtime.          . colchicine (COLCRYS) 0.6 MG tablet   Oral   Take 0.6 mg  by mouth daily as needed (for gout flares).          . docusate sodium (COLACE) 100 MG capsule   Oral   Take 1 capsule (100 mg total) by mouth 2 (two) times daily.   10 capsule   0   . doxazosin (CARDURA) 8 MG tablet   Oral   Take 1 tablet by mouth at bedtime.         . fentaNYL (DURAGESIC - DOSED MCG/HR) 50 MCG/HR   Transdermal   Place 1 patch (50 mcg total) onto the skin every 3 (three) days.   5 patch   0   . ferrous sulfate 325 (65 FE) MG tablet   Oral   Take 1 tablet by mouth daily.         . fluticasone (FLONASE) 50 MCG/ACT nasal spray   Each Nare   Place 1-2 sprays into both nostrils daily as needed for rhinitis.         Marland Kitchen insulin aspart (NOVOLOG) 100 UNIT/ML injection   Subcutaneous   Inject 4-12 Units  into the skin 3 (three) times daily with meals as needed for high blood sugar. Pt uses as needed per sliding scale.         . insulin detemir (LEVEMIR) 100 UNIT/ML injection   Subcutaneous   Inject 0.05 mLs (5 Units total) into the skin at bedtime. Patient taking differently: Inject 12 Units into the skin at bedtime.    10 mL   11   . ipratropium (ATROVENT) 0.03 % nasal spray   Each Nare   Place 1 spray into both nostrils every 12 (twelve) hours.      12   . lactulose (CHRONULAC) 10 GM/15ML solution   Oral   Take 30 mLs (20 g total) by mouth daily as needed for mild constipation.   240 mL   0   . lansoprazole (PREVACID) 15 MG capsule   Oral   Take 15 mg by mouth daily.          Marland Kitchen levofloxacin (LEVAQUIN) 750 MG tablet   Oral   Take 1 tablet by mouth daily.         . metoprolol tartrate (LOPRESSOR) 25 MG tablet   Oral   Take 1 tablet (25 mg total) by mouth 2 (two) times daily.   60 tablet   0   . nystatin (MYCOSTATIN) 100000 UNIT/ML suspension   Oral   Take 5 mLs (500,000 Units total) by mouth 4 (four) times daily.   60 mL   0   . oxyCODONE-acetaminophen (PERCOCET) 5-325 MG tablet   Oral   Take 1 tablet by mouth every 6 (six) hours as needed for severe pain.   20 tablet   0   . polyethylene glycol (MIRALAX / GLYCOLAX) packet   Oral   Take 17 g by mouth daily as needed for mild constipation.         . propranolol ER (INDERAL LA) 80 MG 24 hr capsule   Oral   Take 1 capsule by mouth daily.         . vitamin B-12 (CYANOCOBALAMIN) 500 MCG tablet   Oral   Take 500 mcg by mouth daily.          . beclomethasone (QVAR) 80 MCG/ACT inhaler   Inhalation   Inhale 2 puffs into the lungs 2 (two) times daily as needed (for shortness of breath).         . lidocaine (LIDODERM)  5 %   Transdermal   Place 1 patch onto the skin daily. Remove & Discard patch within 12 hours or as directed by MD   30 patch   0     Allergies Ativan and Neomycin-bacitracin  zn-polymyx  Family History  Problem Relation Age of Onset  . Alcohol abuse Paternal Uncle   . Early death Paternal Uncle   . Arthritis Mother   . Cancer Mother   . Diabetes Mother   . Hearing loss Mother   . Heart disease Mother   . Hyperlipidemia Mother   . Hypertension Mother   . Kidney disease Mother   . Diabetes Father   . Hearing loss Father   . Congestive Heart Failure Father     Social History Social History  Substance Use Topics  . Smoking status: Never Smoker   . Smokeless tobacco: None  . Alcohol Use: No    Review of Systems Constitutional: No fever/chills Eyes: No visual changes. ENT: No sore throat. Cardiovascular: Denies chest pain. Respiratory: Denies shortness of breath. Gastrointestinal: No abdominal pain.   No diarrhea.  No constipation. Patient reports nausea and belching which she says is been ongoing over the past several months. Genitourinary: Negative for dysuria. Musculoskeletal: Negative for back pain. Skin: Negative for rash. Neurological: Negative for headaches, focal weakness or numbness.  10-point ROS otherwise negative.  ____________________________________________   PHYSICAL EXAM:  VITAL SIGNS: ED Triage Vitals  Enc Vitals Group     BP 07/13/15 1432 124/60 mmHg     Pulse Rate 07/13/15 1432 63     Resp 07/13/15 1432 18     Temp 07/13/15 1432 97.9 F (36.6 C)     Temp Source 07/13/15 1432 Oral     SpO2 07/13/15 1432 92 %     Weight 07/13/15 1432 206 lb (93.441 kg)     Height 07/13/15 1432 6' (1.829 m)     Head Cir --      Peak Flow --      Pain Score 07/13/15 1432 0     Pain Loc --      Pain Edu? --      Excl. in [place]? --     Constitutional: Alert and oriented. Well appearing and in no acute distress. Eyes: Conjunctivae are normal. PERRL. EOMI. Head: Atraumatic. Nose: No congestion/rhinnorhea. Mouth/Throat: Mucous membranes are moist.  Oropharynx non-erythematous. Neck: No stridor.  No tenderness to the C-spine. No  deformity or step-off. Cardiovascular: Normal rate, regular rhythm. Grossly normal heart sounds.   Respiratory: Normal respiratory effort.  No retractions. Lungs CTAB. Gastrointestinal: Soft and nontender. No distention. No abdominal bruits.  Musculoskeletal: No lower extremity tenderness nor edema.  No joint effusions. No tenderness to the hips. Ranges the bilateral lower extremities without any restriction or pain. Pulses stable and nontender. Neurologic:  Normal speech and language. No gross focal neurologic deficits are appreciated. No gait instability. Skin:  Skin is warm, dry and intact. Well healing sacral decub with only mild erythema. No skin breakdown, induration or pus. Psychiatric: Mood and affect are normal. Speech and behavior are normal.  ____________________________________________   LABS (all labs ordered are listed, but only abnormal results are displayed)  Labs Reviewed  GLUCOSE, CAPILLARY - Abnormal; Notable for the following:    Glucose-Capillary 287 (*)    All other components within normal limits  URINALYSIS COMPLETEWITH MICROSCOPIC ([facility] ONLY) - Abnormal; Notable for the following:    Color, Urine YELLOW (*)    APPearance CLEAR (*)  Glucose, UA >500 (*)    Protein, ur 100 (*)    Squamous Epithelial / LPF 0-5 (*)    All other components within normal limits   ____________________________________________  EKG  ED ECG REPORT I, Schaevitz,  Youlanda Roys, the attending physician, personally viewed and interpreted this ECG.   Date: 07/13/2015  EKG Time: 1438  Rate: 61  Rhythm: normal sinus rhythm  Axis: Normal axis  Intervals:none  ST&T Change: No ST segment elevation or depression. No abnormal T-wave inversion.  ____________________________________________  RADIOLOGY  CAT scan without any acute findings. ____________________________________________   PROCEDURES   ____________________________________________   INITIAL IMPRESSION / ASSESSMENT AND  PLAN / ED COURSE  Pertinent labs & imaging results that were available during my care of the patient were reviewed by me and considered in my medical decision making (see chart for details).  ----------------------------------------- 6:17 PM on 07/13/2015 -----------------------------------------  Patient resting comfortably at this time. Remains his baseline mental status. We'll give low-dose Reglan at home for nausea and vomiting. I reviewed the patient's labs from earlier in the day and he does not appear to have any acute on chronic issues. Will be discharged home. ____________________________________________   FINAL CLINICAL IMPRESSION(S) / ED DIAGNOSES  Mechanical fall. Head contusion.    Orbie Pyo, MD 07/13/15 [phone]

## 2015-07-13 NOTE — ED Notes (Signed)
Attempted to call report back to [facility].  Per staff member, nurse unable to come to the phone at this time.  This nurse to try again later

## 2015-07-13 NOTE — ED Notes (Signed)
Discharge instructions reviewed with wife, wife verbalized understanding of prescription and plan of care.  EMS called by ED secretary for transport back to [facility].

## 2015-07-14 LAB — C-REACTIVE PROTEIN: CRP: 4.3 mg/dL — AB (ref <1.0)

## 2015-07-15 LAB — GLUCOSE, CAPILLARY
GLUCOSE-CAPILLARY: 314 mg/dL — AB (ref 65–99)
GLUCOSE-CAPILLARY: 320 mg/dL — AB (ref 65–99)
GLUCOSE-CAPILLARY: 287 mg/dL — AB (ref 65–99)
GLUCOSE-CAPILLARY: 330 mg/dL — AB (ref 65–99)
Glucose-Capillary: 253 mg/dL — ABNORMAL HIGH (ref 65–99)
Glucose-Capillary: 325 mg/dL — ABNORMAL HIGH (ref 65–99)
Glucose-Capillary: 246 mg/dL — ABNORMAL HIGH (ref 65–99)
Glucose-Capillary: 177 mg/dL — ABNORMAL HIGH (ref 65–99)
Glucose-Capillary: 240 mg/dL — ABNORMAL HIGH (ref 65–99)

## 2015-07-16 LAB — GLUCOSE, CAPILLARY
GLUCOSE-CAPILLARY: 208 mg/dL — AB (ref 65–99)
Glucose-Capillary: 312 mg/dL — ABNORMAL HIGH (ref 65–99)
Glucose-Capillary: 308 mg/dL — ABNORMAL HIGH (ref 65–99)
Glucose-Capillary: 233 mg/dL — ABNORMAL HIGH (ref 65–99)

## 2015-07-17 LAB — COMPREHENSIVE METABOLIC PANEL
ALBUMIN: 2.6 g/dL — AB (ref 3.5–5.0)
ALT: 13 U/L — ABNORMAL LOW (ref 17–63)
ANION GAP: 8 (ref 5–15)
AST: 18 U/L (ref 15–41)
Alkaline Phosphatase: 64 U/L (ref 38–126)
BILIRUBIN TOTAL: 0.8 mg/dL (ref 0.3–1.2)
BUN: 27 mg/dL — AB (ref 6–20)
CHLORIDE: 100 mmol/L — AB (ref 101–111)
CO2: 27 mmol/L (ref 22–32)
Calcium: 8.3 mg/dL — ABNORMAL LOW (ref 8.9–10.3)
Creatinine, Ser: 1.55 mg/dL — ABNORMAL HIGH (ref 0.61–1.24)
GFR calc Af Amer: 51 mL/min — ABNORMAL LOW (ref >60)
GFR calc non Af Amer: 44 mL/min — ABNORMAL LOW (ref >60)
GLUCOSE: 213 mg/dL — AB (ref 65–99)
POTASSIUM: 4.3 mmol/L (ref 3.5–5.1)
SODIUM: 135 mmol/L (ref 135–145)
TOTAL PROTEIN: 5.7 g/dL — AB (ref 6.5–8.1)

## 2015-07-17 LAB — CBC WITH DIFFERENTIAL/PLATELET
Basophils Absolute: 0.1 10*3/uL (ref 0–0.1)
Basophils Relative: 1 %
Eosinophils Absolute: 0.3 10*3/uL (ref 0–0.7)
Eosinophils Relative: 4 %
HEMATOCRIT: 21.6 % — AB (ref 40.0–52.0)
HEMOGLOBIN: 7.1 g/dL — AB (ref 13.0–18.0)
LYMPHS ABS: 1.7 10*3/uL (ref 1.0–3.6)
LYMPHS PCT: 22 %
MCH: 25.8 pg — AB (ref 26.0–34.0)
MCHC: 32.9 g/dL (ref 32.0–36.0)
MCV: 78.6 fL — AB (ref 80.0–100.0)
Monocytes Absolute: 0.8 10*3/uL (ref 0.2–1.0)
Monocytes Relative: 11 %
NEUTROS ABS: 4.8 10*3/uL (ref 1.4–6.5)
NEUTROS PCT: 62 %
Platelets: 386 10*3/uL (ref 150–440)
RBC: 2.75 MIL/uL — AB (ref 4.40–5.90)
RDW: 17.4 % — ABNORMAL HIGH (ref 11.5–14.5)
WBC: 7.8 10*3/uL (ref 3.8–10.6)

## 2015-07-17 LAB — GLUCOSE, CAPILLARY
GLUCOSE-CAPILLARY: 285 mg/dL — AB (ref 65–99)
GLUCOSE-CAPILLARY: 240 mg/dL — AB (ref 65–99)
GLUCOSE-CAPILLARY: 288 mg/dL — AB (ref 65–99)
Glucose-Capillary: 210 mg/dL — ABNORMAL HIGH (ref 65–99)

## 2015-07-17 LAB — SEDIMENTATION RATE: SED RATE: 75 mm/h — AB (ref 0–20)

## 2015-07-18 LAB — CBC WITH DIFFERENTIAL/PLATELET
BASOS ABS: 0.1 10*3/uL (ref 0–0.1)
BASOS PCT: 1 %
EOS PCT: 5 %
Eosinophils Absolute: 0.4 10*3/uL (ref 0–0.7)
HEMATOCRIT: 23.4 % — AB (ref 40.0–52.0)
Hemoglobin: 7.6 g/dL — ABNORMAL LOW (ref 13.0–18.0)
Lymphocytes Relative: 25 %
Lymphs Abs: 2 10*3/uL (ref 1.0–3.6)
MCH: 26.1 pg (ref 26.0–34.0)
MCHC: 32.6 g/dL (ref 32.0–36.0)
MCV: 80 fL (ref 80.0–100.0)
MONO ABS: 0.8 10*3/uL (ref 0.2–1.0)
MONOS PCT: 10 %
NEUTROS ABS: 4.7 10*3/uL (ref 1.4–6.5)
NEUTROS PCT: 59 %
PLATELETS: 456 10*3/uL — AB (ref 150–440)
RBC: 2.93 MIL/uL — ABNORMAL LOW (ref 4.40–5.90)
RDW: 17.4 % — AB (ref 11.5–14.5)
WBC: 8 10*3/uL (ref 3.8–10.6)

## 2015-07-18 LAB — BASIC METABOLIC PANEL
ANION GAP: 9 (ref 5–15)
BUN: 23 mg/dL — ABNORMAL HIGH (ref 6–20)
CALCIUM: 8.4 mg/dL — AB (ref 8.9–10.3)
CO2: 25 mmol/L (ref 22–32)
CREATININE: 1.46 mg/dL — AB (ref 0.61–1.24)
Chloride: 102 mmol/L (ref 101–111)
GFR, EST AFRICAN AMERICAN: 55 mL/min — AB (ref >60)
GFR, EST NON AFRICAN AMERICAN: 47 mL/min — AB (ref >60)
GLUCOSE: 232 mg/dL — AB (ref 65–99)
Potassium: 4.2 mmol/L (ref 3.5–5.1)
Sodium: 136 mmol/L (ref 135–145)

## 2015-07-19 ENCOUNTER — Ambulatory Visit: Payer: Federal, State, Local not specified - PPO | Admitting: Family Medicine

## 2015-07-19 LAB — GLUCOSE, CAPILLARY: GLUCOSE-CAPILLARY: 290 mg/dL — AB (ref 65–99)

## 2015-07-27 ENCOUNTER — Ambulatory Visit: Payer: Federal, State, Local not specified - PPO | Admitting: Family Medicine

## 2015-08-01 ENCOUNTER — Inpatient Hospital Stay: Payer: Federal, State, Local not specified - PPO | Admitting: Oncology

## 2015-08-14 ENCOUNTER — Ambulatory Visit: Payer: Federal, State, Local not specified - PPO

## 2015-08-24 ENCOUNTER — Ambulatory Visit: Payer: Federal, State, Local not specified - PPO | Admitting: Oncology

## 2015-08-24 ENCOUNTER — Inpatient Hospital Stay: Payer: Federal, State, Local not specified - PPO | Admitting: Oncology

## 2015-10-04 ENCOUNTER — Ambulatory Visit: Payer: Federal, State, Local not specified - PPO | Admitting: Family Medicine

## 2015-11-02 ENCOUNTER — Encounter: Payer: Self-pay | Admitting: Emergency Medicine

## 2015-11-02 DIAGNOSIS — I4891 Unspecified atrial fibrillation: Secondary | ICD-10-CM | POA: Diagnosis not present

## 2015-11-02 DIAGNOSIS — I129 Hypertensive chronic kidney disease with stage 1 through stage 4 chronic kidney disease, or unspecified chronic kidney disease: Secondary | ICD-10-CM | POA: Diagnosis not present

## 2015-11-02 DIAGNOSIS — E1165 Type 2 diabetes mellitus with hyperglycemia: Secondary | ICD-10-CM | POA: Diagnosis present

## 2015-11-02 DIAGNOSIS — E1122 Type 2 diabetes mellitus with diabetic chronic kidney disease: Secondary | ICD-10-CM | POA: Insufficient documentation

## 2015-11-02 DIAGNOSIS — Z7982 Long term (current) use of aspirin: Secondary | ICD-10-CM | POA: Insufficient documentation

## 2015-11-02 DIAGNOSIS — Z794 Long term (current) use of insulin: Secondary | ICD-10-CM | POA: Diagnosis not present

## 2015-11-02 DIAGNOSIS — N183 Chronic kidney disease, stage 3 (moderate): Secondary | ICD-10-CM | POA: Diagnosis not present

## 2015-11-02 DIAGNOSIS — Z79899 Other long term (current) drug therapy: Secondary | ICD-10-CM | POA: Diagnosis not present

## 2015-11-02 LAB — COMPREHENSIVE METABOLIC PANEL
ALBUMIN: 3.8 g/dL (ref 3.5–5.0)
ALK PHOS: 79 U/L (ref 38–126)
ALT: 24 U/L (ref 17–63)
AST: 25 U/L (ref 15–41)
Anion gap: 6 (ref 5–15)
BILIRUBIN TOTAL: 0.2 mg/dL — AB (ref 0.3–1.2)
BUN: 39 mg/dL — ABNORMAL HIGH (ref 6–20)
CO2: 23 mmol/L (ref 22–32)
CREATININE: 1.82 mg/dL — AB (ref 0.61–1.24)
Calcium: 8.9 mg/dL (ref 8.9–10.3)
Chloride: 107 mmol/L (ref 101–111)
GFR calc Af Amer: 42 mL/min — ABNORMAL LOW (ref >60)
GFR, EST NON AFRICAN AMERICAN: 36 mL/min — AB (ref >60)
GLUCOSE: 383 mg/dL — AB (ref 65–99)
POTASSIUM: 5.1 mmol/L (ref 3.5–5.1)
Sodium: 136 mmol/L (ref 135–145)
TOTAL PROTEIN: 6.8 g/dL (ref 6.5–8.1)

## 2015-11-02 LAB — URINALYSIS COMPLETE WITH MICROSCOPIC (ARMC ONLY)
BILIRUBIN URINE: NEGATIVE
Bacteria, UA: NONE SEEN
Glucose, UA: >500 mg/dL — AB
Ketones, ur: NEGATIVE mg/dL
Leukocytes, UA: NEGATIVE
Nitrite: NEGATIVE
PH: 5 (ref 5.0–8.0)
PROTEIN: 100 mg/dL — AB
Specific Gravity, Urine: 1.026 (ref 1.005–1.030)

## 2015-11-02 LAB — CBC
HEMATOCRIT: 37.7 % — AB (ref 40.0–52.0)
HEMOGLOBIN: 12.7 g/dL — AB (ref 13.0–18.0)
MCH: 27.3 pg (ref 26.0–34.0)
MCHC: 33.6 g/dL (ref 32.0–36.0)
MCV: 81.2 fL (ref 80.0–100.0)
Platelets: 399 10*3/uL (ref 150–440)
RBC: 4.64 MIL/uL (ref 4.40–5.90)
RDW: 15.9 % — AB (ref 11.5–14.5)
WBC: 9 10*3/uL (ref 3.8–10.6)

## 2015-11-02 LAB — GLUCOSE, CAPILLARY: Glucose-Capillary: 352 mg/dL — ABNORMAL HIGH (ref 65–99)

## 2015-11-02 NOTE — ED Triage Notes (Signed)
Pt has had high blood sugars since he had insulin changed 4 months ago. States today was 451 and became concerned.

## 2015-11-03 ENCOUNTER — Emergency Department
Admission: EM | Admit: 2015-11-03 | Discharge: 2015-11-03 | Disposition: A | Discharge status: Home / Self Care | Payer: Non-veteran care | Attending: Emergency Medicine | Admitting: Emergency Medicine

## 2015-11-03 DIAGNOSIS — R739 Hyperglycemia, unspecified: Secondary | ICD-10-CM

## 2015-11-03 LAB — GLUCOSE, CAPILLARY
Glucose-Capillary: 309 mg/dL — ABNORMAL HIGH (ref 65–99)
Glucose-Capillary: 234 mg/dL — ABNORMAL HIGH (ref 65–99)

## 2015-11-03 MED ORDER — INSULIN ASPART 100 UNIT/ML ~~LOC~~ SOLN
4.0000 [IU] | Freq: Once | SUBCUTANEOUS | Status: AC
  Administered 2015-11-03: 4 [IU] via INTRAVENOUS
  Filled 2015-11-03: qty 4

## 2015-11-03 MED ORDER — SODIUM CHLORIDE 0.9 % IV BOLUS (SEPSIS)
1000.0000 mL | Freq: Once | INTRAVENOUS | Status: AC
  Administered 2015-11-03: 1000 mL via INTRAVENOUS

## 2015-11-03 NOTE — ED Notes (Signed)
Pt reports he has had elevated blood sugar of 451 tonight and [facility] him to come to the er for eval - pt states he has been sick since Feb , started with staph infection in his blood per wife - pt denies nausea/vomiting/diarrhea - wife states his insulin dosages were changed when he left the hospital the last time - at this time his FSBS is 35 - he took 16u of Levemir at 69mn

## 2015-11-03 NOTE — ED Provider Notes (Signed)
[facility] Emergency Department Provider Note  ____________________________________________  Time seen: Approximately 2:19 AM  I have reviewed the triage vital signs and the nursing notes.   HISTORY  Chief Complaint Hyperglycemia   HPI Eric Glover is a 70 y.o. male h/o PE, afib on Eliquis, IDDM2, CKD, hypertension and hyperlipidemia who presents for evaluation of hyperglycemia. Patient reports that he has been having trouble controlling his sugars since the end of June. Patient was in rehabilitation for 2 months after being hospitalized for bacteremia at the [place]. He left rehabilitation in the end of June and had his insulin regimen changed at that time. He was placed on a sliding scale and Levemir long-acting. He reports that since then his sugars have been in the mid 200s to the low 300s. This evening he checked his glucose before dinner and he was 450. Patient reports that he was scared because he has never been this high. He called the [facility] and he was instructed to come to the emergency department. Patient reports that he took 18 units of the Levemir before coming in and took 8 units of a short acting insulin at [place]. He denies headache, rash, neck stiffness, fever, chest pain, shortness of breath, cough, abdominal pain, nausea, vomiting, diarrhea, dysuria. Patient was referred to a new primary care doctor and has his first appointment this Monday. Therefore he hasn't been able to see a primary care doctor since leaving rehabilitation to have his insulin regimen adjusted.  Past Medical History:  Diagnosis Date  . Atrial fibrillation ([place])   . CKD (chronic kidney disease), stage III   . Diabetes mellitus without complication ([place])   . Discitis of lumbosacral region   . Discitis of lumbosacral region   . GERD (gastroesophageal reflux disease)   . Gout   . Hyperlipidemia   . Hypertension   . Pulmonary embolism ([place])   . Staph infection 2017     Patient Active Problem List   Diagnosis Date Noted  . Fall 06/29/2015  . Acute liver failure   . Cholelithiasis without cholecystitis   . Elevated LFTs   . Acute renal failure ([place]) 06/13/2015  . Pressure ulcer 06/13/2015  . A-fib ([place]) 05/30/2015  . Bacteremia due to Gram-positive bacteria 05/24/2015  . DDD (degenerative disc disease), lumbar 05/09/2015  . Sciatica associated with disorder of lumbar spine 05/09/2015  . HTN (hypertension) 05/06/2015  . Acute on chronic kidney failure ([place]) 05/06/2015  . Low back pain 05/06/2015  . Diabetes mellitus with neuropathy ([place]) 01/11/2015  . Benign hypertensive kidney disease 05/31/2010  . Chronic kidney disease (CKD), stage III (moderate) 05/31/2010  . Gout 05/31/2010  . HLD (hyperlipidemia) 05/31/2010  . Type 2 diabetes mellitus ([place]) 05/31/2010    Past Surgical History:  Procedure Laterality Date  . SHOULDER ARTHROSCOPY W/ ROTATOR CUFF REPAIR    . TEE WITHOUT CARDIOVERSION N/A 05/15/2015   Procedure: TRANSESOPHAGEAL ECHOCARDIOGRAM (TEE);  Surgeon: Teodoro Spray, MD;  Location: [facility] ORS;  Service: Cardiovascular;  Laterality: N/A;    Prior to Admission medications   Medication Sig Start Date End Date Taking? Authorizing Provider  allopurinol (ZYLOPRIM) 100 MG tablet Take 100 mg by mouth daily.     Historical Provider, MD  amiodarone (PACERONE) 200 MG tablet Take 1 tablet (200 mg total) by mouth daily. 06/23/15   Steele Sizer, MD  apixaban (ELIQUIS) 5 MG TABS tablet Take 1 tablet (5 mg total) by mouth 2 (two) times daily. 06/23/15   Drue Stager  Sowles, MD  aspirin EC 81 MG tablet Take 81 mg by mouth at bedtime.     Historical Provider, MD  beclomethasone (QVAR) 80 MCG/ACT inhaler Inhale 2 puffs into the lungs 2 (two) times daily as needed (for shortness of breath).    Historical Provider, MD  colchicine (COLCRYS) 0.6 MG tablet Take 0.6 mg by mouth daily as needed (for gout flares).     Historical Provider, MD  docusate sodium  (COLACE) 100 MG capsule Take 1 capsule (100 mg total) by mouth 2 (two) times daily. 07/04/15   Demetrios Loll, MD  doxazosin (CARDURA) 8 MG tablet Take 1 tablet by mouth at bedtime. 03/23/15   Historical Provider, MD  fentaNYL (DURAGESIC - DOSED MCG/HR) 50 MCG/HR Place 1 patch (50 mcg total) onto the skin every 3 (three) days. 07/04/15   Demetrios Loll, MD  ferrous sulfate 325 (65 FE) MG tablet Take 1 tablet by mouth daily. 06/29/15 06/28/16  Historical Provider, MD  fluticasone (FLONASE) 50 MCG/ACT nasal spray Place 1-2 sprays into both nostrils daily as needed for rhinitis.    Historical Provider, MD  insulin aspart (NOVOLOG) 100 UNIT/ML injection Inject 4-12 Units into the skin 3 (three) times daily with meals as needed for high blood sugar. Pt uses as needed per sliding scale.    Historical Provider, MD  insulin detemir (LEVEMIR) 100 UNIT/ML injection Inject 0.05 mLs (5 Units total) into the skin at bedtime. Patient taking differently: Inject 12 Units into the skin at bedtime.  07/04/15   Demetrios Loll, MD  ipratropium (ATROVENT) 0.03 % nasal spray Place 1 spray into both nostrils every 12 (twelve) hours. 06/22/15   Historical Provider, MD  lactulose (CHRONULAC) 10 GM/15ML solution Take 30 mLs (20 g total) by mouth daily as needed for mild constipation. 07/04/15   Demetrios Loll, MD  lansoprazole (PREVACID) 15 MG capsule Take 15 mg by mouth daily.     Historical Provider, MD  lidocaine (LIDODERM) 5 % Place 1 patch onto the skin daily. Remove & Discard patch within 12 hours or as directed by MD 05/17/15   Loletha Grayer, MD  metoCLOPramide (REGLAN) 5 MG tablet Take 1 tablet (5 mg total) by mouth every 8 (eight) hours as needed for nausea or vomiting. 07/13/15 07/12/16  Orbie Pyo, MD  metoprolol tartrate (LOPRESSOR) 25 MG tablet Take 1 tablet (25 mg total) by mouth 2 (two) times daily. 06/23/15   Steele Sizer, MD  nystatin (MYCOSTATIN) 100000 UNIT/ML suspension Take 5 mLs (500,000 Units total) by mouth 4 (four)  times daily. 05/17/15   Loletha Grayer, MD  oxyCODONE-acetaminophen (PERCOCET) 5-325 MG tablet Take 1 tablet by mouth every 6 (six) hours as needed for severe pain. 07/04/15   Demetrios Loll, MD  polyethylene glycol [facility] Floria Raveling) packet Take 17 g by mouth daily as needed for mild constipation.    Historical Provider, MD  propranolol ER (INDERAL LA) 80 MG 24 hr capsule Take 1 capsule by mouth daily. 06/19/15   Historical Provider, MD  vitamin B-12 (CYANOCOBALAMIN) 500 MCG tablet Take 500 mcg by mouth daily.     Historical Provider, MD    Allergies Ativan [lorazepam] and Neomycin-bacitracin zn-polymyx  Family History  Problem Relation Age of Onset  . Arthritis Mother   . Cancer Mother   . Diabetes Mother   . Hearing loss Mother   . Heart disease Mother   . Hyperlipidemia Mother   . Hypertension Mother   . Kidney disease Mother   . Diabetes Father   .  Hearing loss Father   . Congestive Heart Failure Father   . Alcohol abuse Paternal Uncle   . Early death Paternal Uncle     Social History Social History  Substance Use Topics  . Smoking status: Never Smoker  . Smokeless tobacco: Not on file  . Alcohol use No    Review of Systems  Constitutional: Negative for fever. Eyes: Negative for visual changes. ENT: Negative for sore throat. Cardiovascular: Negative for chest pain. Respiratory: Negative for shortness of breath. Gastrointestinal: Negative for abdominal pain, vomiting or diarrhea. Genitourinary: Negative for dysuria. Musculoskeletal: Negative for back pain. Skin: Negative for rash. Neurological: Negative for headaches, weakness or numbness.  ____________________________________________   PHYSICAL EXAM:  VITAL SIGNS: ED Triage Vitals  Enc Vitals Group     BP 11/02/15 2218 128/65     Pulse Rate 11/02/15 2218 (!) 59     Resp 11/02/15 2218 18     Temp 11/02/15 2218 98 F (36.7 C)     Temp Source 11/02/15 2218 Oral     SpO2 11/02/15 2218 98 %     Weight  11/02/15 2219 190 lb (86.2 kg)     Height 11/02/15 2219 6\' 2"  (1.88 m)     Head Circumference --      Peak Flow --      Pain Score 11/03/15 0151 2     Pain Loc --      Pain Edu? --      Excl. in [place]? --     Constitutional: Alert and oriented. Well appearing and in no apparent distress. HEENT:      Head: Normocephalic and atraumatic.         Eyes: Conjunctivae are normal. Sclera is non-icteric. EOMI. PERRL      Mouth/Throat: Mucous membranes are moist.       Neck: Supple with no signs of meningismus. Cardiovascular: Regular rate and rhythm. No murmurs, gallops, or rubs. 2+ symmetrical distal pulses are present in all extremities. No JVD. Respiratory: Normal respiratory effort. Lungs are clear to auscultation bilaterally. No wheezes, crackles, or rhonchi.  Gastrointestinal: Soft, non tender, and non distended with positive bowel sounds. No rebound or guarding. Genitourinary: No CVA tenderness. Musculoskeletal: Nontender with normal range of motion in all extremities. No edema, cyanosis, or erythema of extremities. Neurologic: Normal speech and language. Face is symmetric. Moving all extremities. No gross focal neurologic deficits are appreciated. Skin: Skin is warm, dry and intact. No rash noted. Psychiatric: Mood and affect are normal. Speech and behavior are normal.  ____________________________________________   LABS (all labs ordered are listed, but only abnormal results are displayed)  Labs Reviewed  CBC - Abnormal; Notable for the following:       Result Value   Hemoglobin 12.7 (*)    HCT 37.7 (*)    RDW 15.9 (*)    All other components within normal limits  COMPREHENSIVE METABOLIC PANEL - Abnormal; Notable for the following:    Glucose, Bld 383 (*)    BUN 39 (*)    Creatinine, Ser 1.82 (*)    Total Bilirubin 0.2 (*)    GFR calc non Af Amer 36 (*)    GFR calc Af Amer 42 (*)    All other components within normal limits  URINALYSIS COMPLETEWITH MICROSCOPIC ([facility] ONLY) -  Abnormal; Notable for the following:    Color, Urine YELLOW (*)    APPearance CLEAR (*)    Glucose, UA >500 (*)    Hgb urine dipstick 1+ (*)  Protein, ur 100 (*)    Squamous Epithelial / LPF 0-5 (*)    All other components within normal limits  GLUCOSE, CAPILLARY - Abnormal; Notable for the following:    Glucose-Capillary 352 (*)    All other components within normal limits  GLUCOSE, CAPILLARY - Abnormal; Notable for the following:    Glucose-Capillary 309 (*)    All other components within normal limits  CBG MONITORING, ED  CBG MONITORING, ED   ____________________________________________  EKG  none  ____________________________________________  RADIOLOGY  none  ____________________________________________   PROCEDURES  Procedure(s) performed: None Procedures Critical Care performed:  None ____________________________________________   INITIAL IMPRESSION / ASSESSMENT AND PLAN / ED COURSE  70 y.o. male h/o PE, afib on Eliquis, IDDM2, CKD, hypertension and hyperlipidemia who presents for evaluation of hyperglycemia. Patient has had a new insulin regimen and has had persistent elevated glucose. No signs or symptoms of infection. Patient's blood glucose here is 309, no ketonuria, normal bicarbonate. Plan for IVF, will give 4U of IV insulin and re-check BG. If that's below 300 we'll discharge home. We'll defer management of his insulin regimen by his PCP on his upcoming appointment.  Clinical Course  Comment By Time  BG 234. Patient remains stable with no complaints. Labs WNL. Will dc home with close f.u with PCP on Monday.   I discussed my evaluation of the patient's symptoms, my clinical impression, and my proposed outpatient treatment plan with patient/ family members. We have discussed anticipatory guidance, scheduled follow-up, and careful return precautions. The patient expresses understanding and is comfortable with the discharge plan. All patient's questions were  answered.  Rudene Re, MD 08/11 [phone]    Pertinent labs & imaging results that were available during my care of the patient were reviewed by me and considered in my medical decision making (see chart for details).    ____________________________________________   FINAL CLINICAL IMPRESSION(S) / ED DIAGNOSES  Final diagnoses:  Hyperglycemia      NEW MEDICATIONS STARTED DURING THIS VISIT:  New Prescriptions   No medications on file     Note:  This document was prepared using Dragon voice recognition software and may include unintentional dictation errors.    Rudene Re, MD 11/03/15 [phone]

## 2015-11-03 NOTE — ED Notes (Signed)
FSBS 234

## 2016-05-01 ENCOUNTER — Telehealth: Payer: Self-pay | Admitting: Family Medicine

## 2016-05-01 ENCOUNTER — Other Ambulatory Visit: Payer: Self-pay | Admitting: Family Medicine

## 2016-05-01 MED ORDER — OSELTAMIVIR PHOSPHATE 75 MG PO CAPS
75.0000 mg | ORAL_CAPSULE | Freq: Every day | ORAL | 0 refills | Status: DC
Start: 2016-05-01 — End: 2017-06-01

## 2016-05-01 NOTE — Telephone Encounter (Signed)
Patient stated that him and his wife keep their granddaughter during the day and she has now tested positive for the flu.  Patient would like to know if Tamflu can be sent to the [facility] on [address].

## 2016-11-16 IMAGING — MR MR LUMBAR SPINE W/O CM
5 series · 37 of 48 positions shown · non-contrast
Comparison: 05/10/2015

CLINICAL DATA: Bacteremia. Right-sided back pain. Being treated
with antibiotics.

EXAM:
MRI LUMBAR SPINE WITHOUT CONTRAST
TECHNIQUE: Multiplanar, multisequence MR imaging of the lumbar spine was
performed. No intravenous contrast was administered.

[Series 2: T2 · sagittal · 4.0mm · 0.81mm/px · 5 of 15 slices shown (1 of 2)]
[im 1/15]
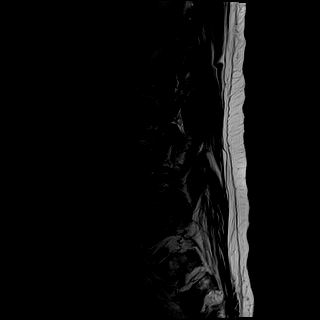
[im 4/15]
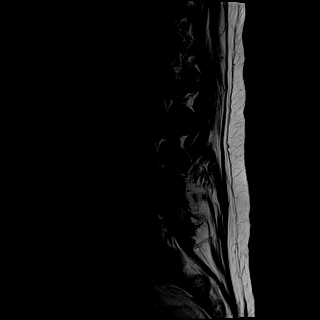
[im 8/15]
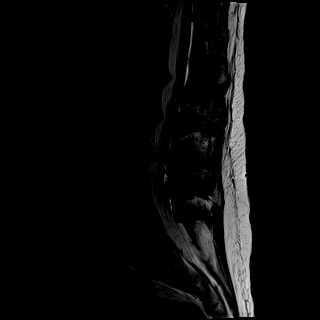
[im 11/15]
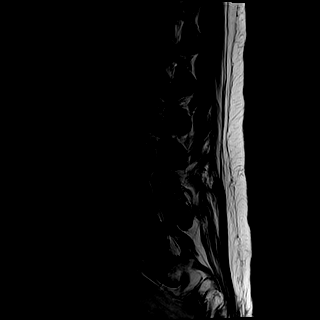
[im 15/15]
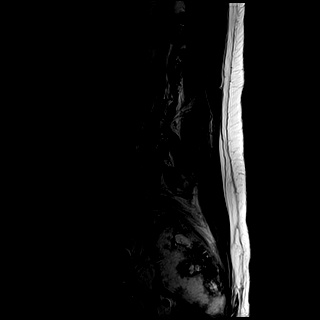

[Series 3: T1 · sagittal · 4.0mm · 0.81mm/px · 5 of 15 slices shown (1 of 2)]
[im 1/15]
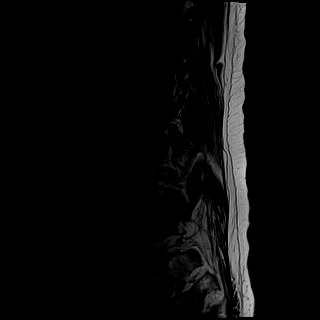
[im 4/15]
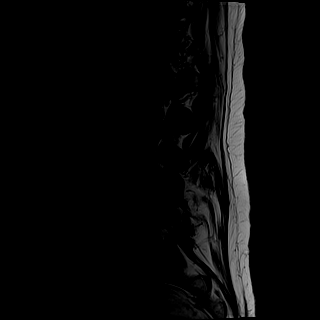
[im 8/15]
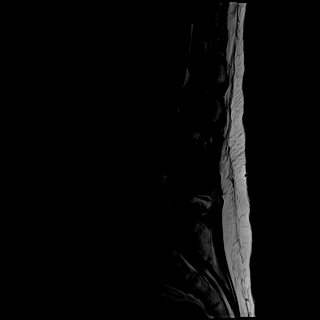
[im 11/15]
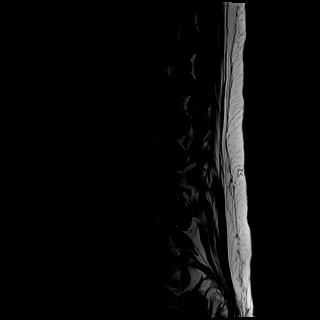
[im 15/15]
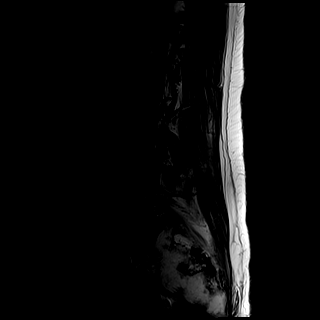

[Series 4: STIR · sagittal · 4.0mm · 1.02mm/px · 6 of 15 slices shown]
[im 1/15]
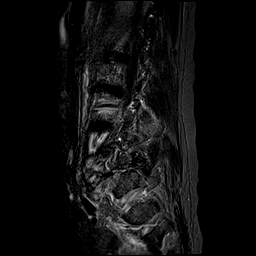
[im 3/15]
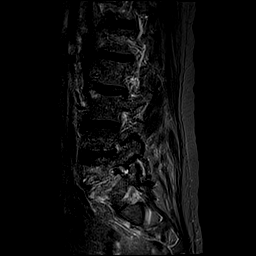
[im 6/15]
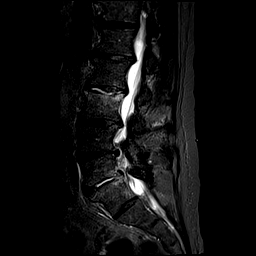
[im 9/15]
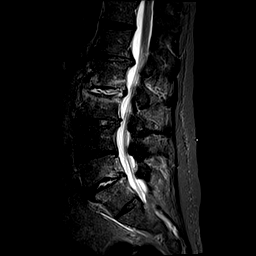
[im 12/15]
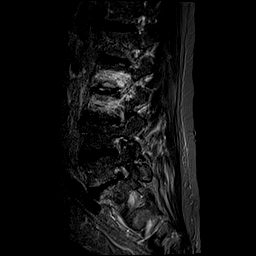
[im 15/15]
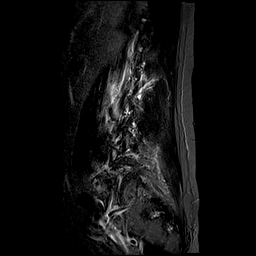

[Series 5: T2 · axial · 4.0mm · 0.78mm/px · z∈[-97,+129]mm · 11 of 42 slices shown (2 of 2)]
[im 1/42]
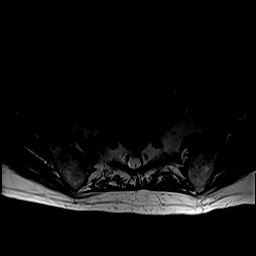
[im 3/42]
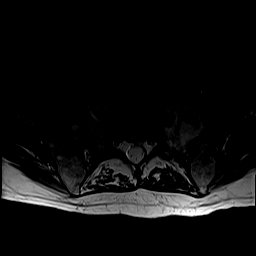
[im 6/42]
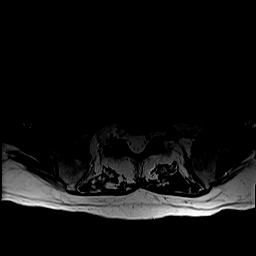
[im 9/42]
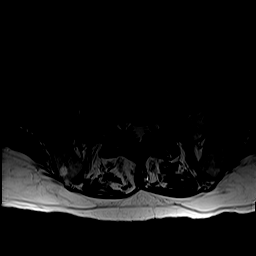
[im 14/42]
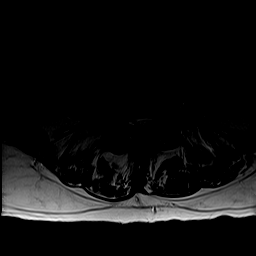
[im 20/42]
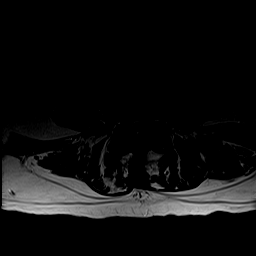
[im 22/42]
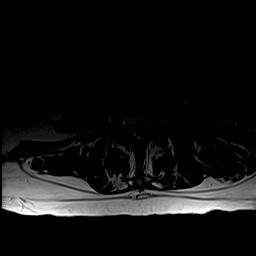
[im 25/42]
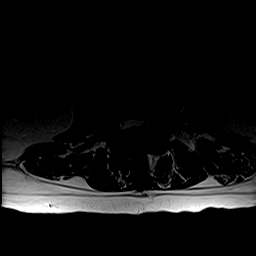
[im 31/42]
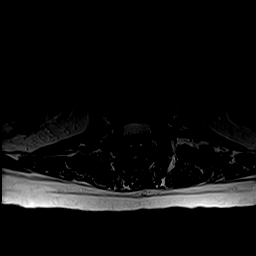
[im 36/42]
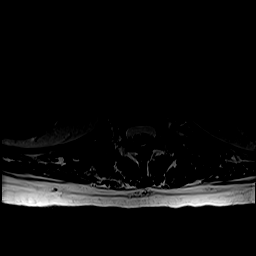
[im 42/42]
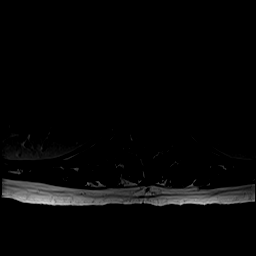

[Series 6: T1 · axial · 4.0mm · 0.39mm/px · z∈[-87,+129]mm · 10 of 42 slices shown (2 of 2)]
[im 3/42]
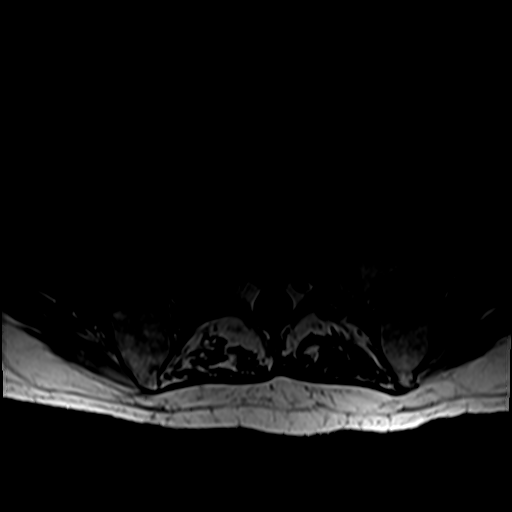
[im 6/42]
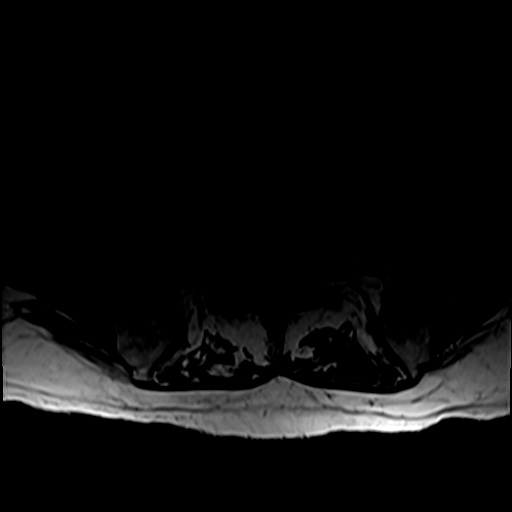
[im 9/42]
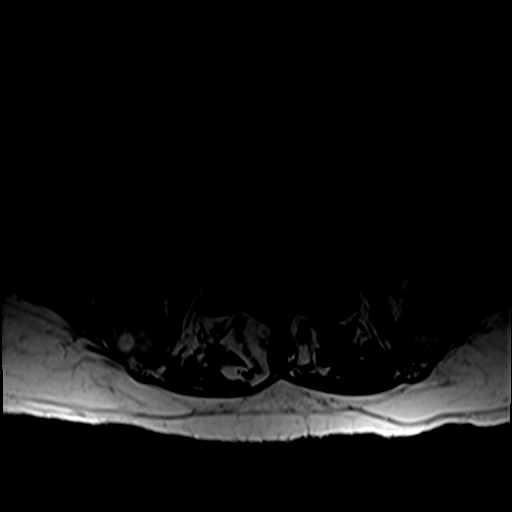
[im 14/42]
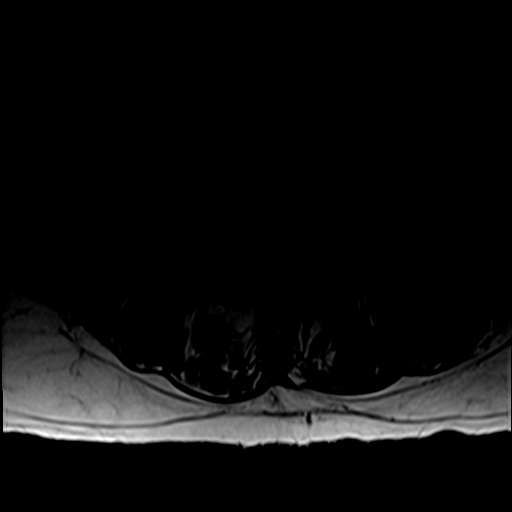
[im 20/42]
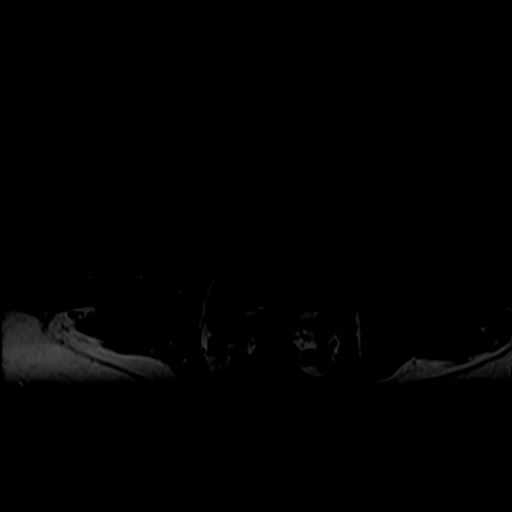
[im 22/42]
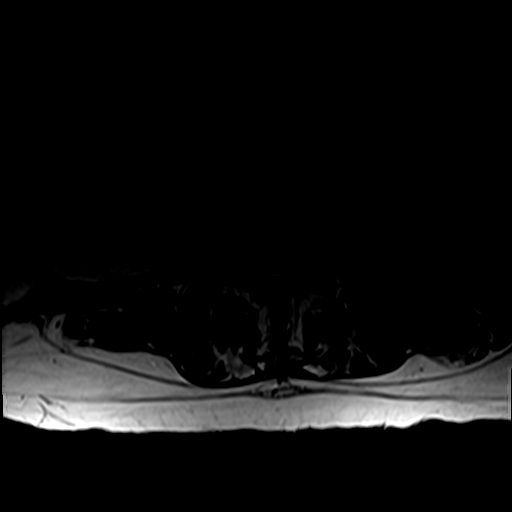
[im 25/42]
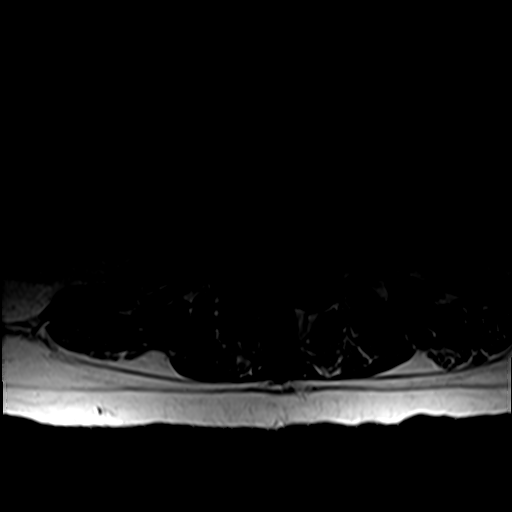
[im 31/42]
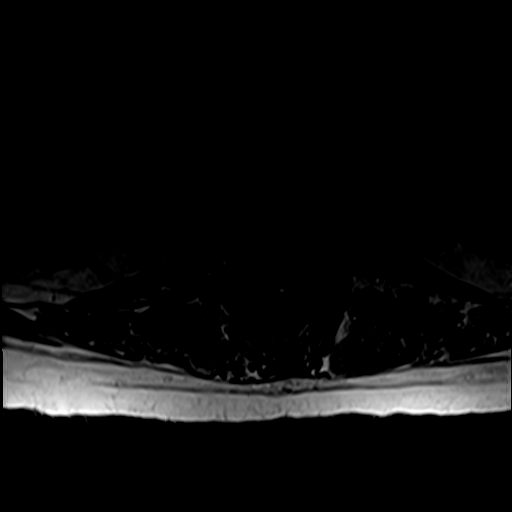
[im 36/42]
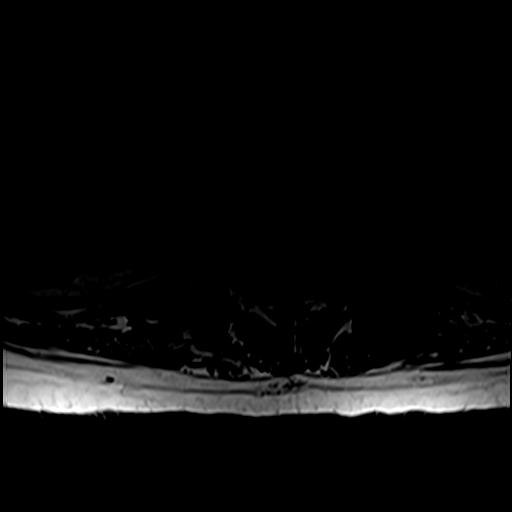
[im 42/42]
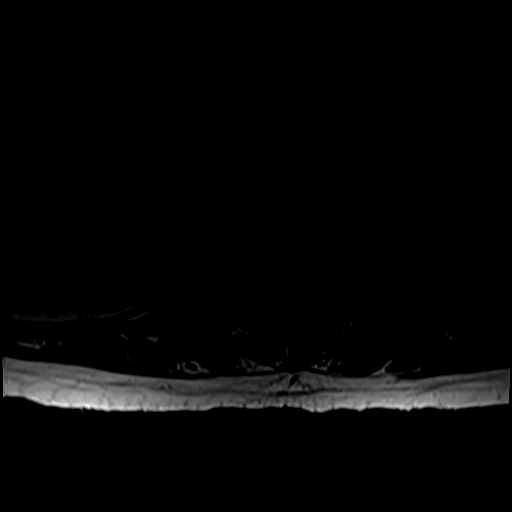

[37 of 48 positions shown; findings below may reference images not displayed]

FINDINGS: Since the previous study, the patient has developed changes at the
L2-3 and L5-S1 levels likely to indicate infectious discitis and
early vertebral body osteomyelitis. At those levels, increased water
signal is present in the disc space. Increased marrow edema is
present within the L2, L3, L5 and S1 vertebral bodies. There is some
extension of phlegmonous material into the intervertebral foramen on
the left at L2-3. There is some abnormal material in the ventral
epidural space behind L5 the could be inflammatory material.

Chronic degenerative changes at the other levels are unchanged.
IMPRESSION: Since the previous study, changes at L2-3 and L5-S1 are strongly
suggestive of infectious discitis and vertebral body osteomyelitis.
Early phlegmonous inflammation extending to the left at the L2-3
level extending into the foramen and the paraspinous soft tissues.
Probable early phlegmonous inflammation in the ventral epidural
space behind L5.

## 2017-05-30 ENCOUNTER — Telehealth: Payer: Self-pay | Admitting: Family Medicine

## 2017-05-30 NOTE — Telephone Encounter (Signed)
Copied from [place] [phone]. Topic: Quick Communication - See Telephone Encounter >> May 30, 2017  3:25 PM Ether Griffins B wrote: CRM for notification. See Telephone encounter for:  Pt has been around great grandkids who had the flu last week now they are having symptoms of sore throat, ear pain, low grade fever, and runny nose. They are hoping tama flu can be called in.  05/30/17.

## 2017-06-01 NOTE — Telephone Encounter (Signed)
Just now seeing this message. Please confirm patient still needs it. Is it now for prevention or he has flu symptoms?

## 2017-06-02 NOTE — Telephone Encounter (Signed)
Patient states he went into the [place] due to his symptoms of cough, congestion, scratchy throat and light fever. Patient tested positive with the nasal swab and was given Tamiflu. Patient is requesting a preventative medication from Dr. Ancil Boozer for his wife since she is having same symptoms.

## 2017-06-06 ENCOUNTER — Ambulatory Visit (INDEPENDENT_AMBULATORY_CARE_PROVIDER_SITE_OTHER): Payer: Federal, State, Local not specified - PPO | Admitting: Family Medicine

## 2017-06-06 ENCOUNTER — Encounter: Payer: Self-pay | Admitting: Family Medicine

## 2017-06-06 VITALS — BP 124/80 | HR 73 | Temp 97.9°F | Resp 18 | Ht 74.0 in | Wt 243.7 lb

## 2017-06-06 DIAGNOSIS — J189 Pneumonia, unspecified organism: Secondary | ICD-10-CM

## 2017-06-06 DIAGNOSIS — R05 Cough: Secondary | ICD-10-CM | POA: Diagnosis not present

## 2017-06-06 DIAGNOSIS — Z794 Long term (current) use of insulin: Secondary | ICD-10-CM

## 2017-06-06 DIAGNOSIS — J181 Lobar pneumonia, unspecified organism: Secondary | ICD-10-CM

## 2017-06-06 DIAGNOSIS — Z8619 Personal history of other infectious and parasitic diseases: Secondary | ICD-10-CM | POA: Diagnosis not present

## 2017-06-06 DIAGNOSIS — N183 Chronic kidney disease, stage 3 (moderate): Secondary | ICD-10-CM

## 2017-06-06 DIAGNOSIS — R0602 Shortness of breath: Secondary | ICD-10-CM

## 2017-06-06 DIAGNOSIS — J111 Influenza due to unidentified influenza virus with other respiratory manifestations: Secondary | ICD-10-CM

## 2017-06-06 DIAGNOSIS — R059 Cough, unspecified: Secondary | ICD-10-CM

## 2017-06-06 DIAGNOSIS — R0981 Nasal congestion: Secondary | ICD-10-CM

## 2017-06-06 DIAGNOSIS — E1122 Type 2 diabetes mellitus with diabetic chronic kidney disease: Secondary | ICD-10-CM

## 2017-06-06 MED ORDER — FLUTICASONE PROPIONATE 50 MCG/ACT NA SUSP: 1.0000 | Freq: Every day | NASAL | 1 refills | Status: AC | PRN

## 2017-06-06 MED ORDER — ALBUTEROL SULFATE HFA 108 (90 BASE) MCG/ACT IN AERS: 2.0000 | INHALATION_SPRAY | Freq: Four times a day (QID) | RESPIRATORY_TRACT | 0 refills | Status: AC | PRN

## 2017-06-06 MED ORDER — DOXYCYCLINE HYCLATE 100 MG PO TABS: 100.0000 mg | ORAL_TABLET | Freq: Two times a day (BID) | ORAL | 0 refills | Status: AC

## 2017-06-06 MED ORDER — GUAIFENESIN ER 600 MG PO TB12: 600.0000 mg | ORAL_TABLET | Freq: Two times a day (BID) | ORAL | 0 refills | Status: DC | PRN

## 2017-06-06 MED ORDER — BENZONATATE 100 MG PO CAPS: 100.0000 mg | ORAL_CAPSULE | Freq: Three times a day (TID) | ORAL | 0 refills | Status: DC | PRN

## 2017-06-06 NOTE — Patient Instructions (Addendum)
Diabetes Mellitus and Sick Day Management Blood sugar (glucose) can be difficult to control when you are sick. Common illnesses that can cause problems for people with diabetes (diabetes mellitus) include colds, fever, flu (influenza), nausea, vomiting, and diarrhea. These illnesses can cause stress and loss of body fluids (dehydration), and those issues can cause blood glucose levels to increase. Because of this, it is very important to take your insulin and diabetes medicines and eat some form of carbohydrate when you are sick. You should make a plan for days when you are sick (sick day plan) as part of your diabetes management plan. You and your health care provider should make this plan in advance. The following guidelines are intended to help you manage an illness that lasts for about 24 hours or less. Your health care provider may also give you more specific instructions. What do I need to do to manage my blood glucose?  Check your blood glucose every 2-4 hours, or as often as told by your health care provider.  Know your sick day treatment goals. Your target blood glucose levels may be different when you are sick.  If you use insulin, take your usual dose. ? If your blood glucose continues to be too high, you may need to take an additional insulin dose as told by your health care provider.  If you use oral diabetes medicine, you may need to stop taking it if you are not able to eat or drink normally. Ask your health care provider about whether you need to stop taking these medicines while you are sick.  If you use injectable hormone medicines other than insulin to control your diabetes, ask your health care provider about whether you need to stop taking these medicines while you are sick. What else can I do to manage my diabetes when I am sick? Check your ketones  If you have type 1 diabetes, check your urine ketones every 4 hours.  If you have type 2 diabetes, check your urine ketones as  often as told by your health care provider. Drink fluids  Drink enough fluid to keep your urine clear or pale yellow. This is especially important if you have a fever, vomiting, or diarrhea. Those symptoms can lead to dehydration.  Follow any instructions from your health care provider about beverages to avoid. ? Do not drink alcohol, caffeine, or drinks that contain a lot of sugar. Take medicines as directed  Take-over-the-counter and prescription medicines only as told by your health care provider.  Check medicine labels for added sugars. Some medicines may contain sugar or types of sugars that can raise your blood glucose level. What foods can I eat when I am sick? You need to eat some form of carbohydrates when you are sick. You should eat 45-50 grams (45-50 g) of carbohydrates every 3-4 hours until you feel better. All of the food choices below contain about 15 g of carbohydrates. Plan ahead and keep some of these foods around so you have them if you get sick.  4-6 oz (120-177 mL) carbonated beverage that contains sugar, such as regular (not diet) soda. You may be able to drink carbonated beverages more easily if you open the beverage and let it sit at room temperature for a few minutes before drinking.   of a twin frozen ice pop.  4 oz (120 g) regular gelatin.  4 oz (120 mL) fruit juice.  4 oz (120 g) ice cream or frozen yogurt.  2 oz (60  g) sherbet.  8 oz (240 mL) clear broth or soup.  4 oz (120 g) regular custard.  4 oz (120 g) regular pudding.  8 oz (240 g) plain yogurt.  1 slice bread or toast.  6 saltine crackers.  5 vanilla wafers.  Questions to ask your health care provider Consider asking the following questions so you know what to do on days when you are sick:  Should I adjust my diabetes medicines?  How often do I need to check my blood glucose?  What supplies do I need to manage my diabetes at home when I am sick?  What number can I call if I have  questions?  What foods and drinks should I avoid?  Contact a health care provider if:  You develop symptoms of diabetic ketoacidosis, such as: ? Fatigue. ? Weight loss. ? Excessive thirst. ? Light-headedness. ? Fruity or sweet-smelling breath. ? Excessive urination. ? Vision changes. ? Confusion or irritability. ? Nausea. ? Vomiting. ? Rapid breathing. ? Pain in the abdomen. ? Feeling flushed.  You are unable to drink fluids without vomiting.  You have any of the following for more than 6 hours: ? Nausea. ? Vomiting. ? Diarrhea.  Your blood glucose is at or above 240 mg/dL (13.3 mmol/L), even after you take an additional insulin dose.  You have a change in how you think, feel, or act (mental status).  You develop another serious illness.  You have been sick or have had a fever for 2 days or longer and you are not getting better. Get help right away if:  Your blood glucose is lower than 54 mg/dL (3.0 mmol/L).  You have difficulty breathing.  You have moderate or high ketone levels in your urine.  You used emergency glucagon to treat low blood glucose. Summary  Blood sugar (glucose) can be difficult to control when you are sick. Common illnesses that can cause problems for people with diabetes (diabetes mellitus) include colds, fever, flu (influenza), nausea, vomiting, and diarrhea.  Illnesses can cause stress and loss of body fluids (dehydration), and those issues can cause blood glucose levels to increase.  Make a plan for days when you are sick (sick day plan) as part of your diabetes management plan. You and your health care provider should make this plan in advance.  It is very important to take your insulin and diabetes medicines and to eat some form of carbohydrate when you are sick.  Contact your health care provider if have problems managing your blood glucose levels when you are sick, or if you have been sick or had a fever for 2 days or longer and are  not getting better. This information is not intended to replace advice given to you by your health care provider. Make sure you discuss any questions you have with your health care provider. Document Released: 03/14/2003 Document Revised: 12/08/2015 Document Reviewed: 12/08/2015 Elsevier Interactive Patient Education  2018 [place]  Community-Acquired Pneumonia, Adult Pneumonia is an infection of the lungs. One type of pneumonia can happen while a person is in a hospital. A different type can happen when a person is not in a hospital (community-acquired pneumonia). It is easy for this kind to spread from person to person. It can spread to you if you breathe near an infected person who coughs or sneezes. Some symptoms include:  A dry cough.  A wet (productive) cough.  Fever.  Sweating.  Chest pain.  Follow these instructions at home:  Take  over-the-counter and prescription medicines only as told by your doctor. ? Only take cough medicine if you are losing sleep. ? If you were prescribed an antibiotic medicine, take it as told by your doctor. Do not stop taking the antibiotic even if you start to feel better.  Sleep with your head and neck raised (elevated). You can do this by putting a few pillows under your head, or you can sleep in a recliner.  Do not use tobacco products. These include cigarettes, chewing tobacco, and e-cigarettes. If you need help quitting, ask your doctor.  Drink enough water to keep your pee (urine) clear or pale yellow. A shot (vaccine) can help prevent pneumonia. Shots are often suggested for:  People older than 72 years of age.  People older than 72 years of age: ? Who are having cancer treatment. ? Who have long-term (chronic) lung disease. ? Who have problems with their body's defense system (immune system).  You may also prevent pneumonia if you take these actions:  Get the flu (influenza) shot every year.  Go to the dentist as often as  told.  Wash your hands often. If soap and water are not available, use hand sanitizer.  Contact a doctor if:  You have a fever.  You lose sleep because your cough medicine does not help. Get help right away if:  You are short of breath and it gets worse.  You have more chest pain.  Your sickness gets worse. This is very serious if: ? You are an older adult. ? Your body's defense system is weak.  You cough up blood. This information is not intended to replace advice given to you by your health care provider. Make sure you discuss any questions you have with your health care provider. Document Released: 08/28/2007 Document Revised: 08/17/2015 Document Reviewed: 07/06/2014 Elsevier Interactive Patient Education  2018 [employer].  Influenza, Adult Influenza ("the flu") is an infection in the lungs, nose, and throat (respiratory tract). It is caused by a virus. The flu causes many common cold symptoms, as well as a high fever and body aches. It can make you feel very sick. The flu spreads easily from person to person (is contagious). Getting a flu shot (influenza vaccination) every year is the best way to prevent the flu. Follow these instructions at home:  Take over-the-counter and prescription medicines only as told by your doctor.  Use a cool mist humidifier to add moisture (humidity) to the air in your home. This can make it easier to breathe.  Rest as needed.  Drink enough fluid to keep your pee (urine) clear or pale yellow.  Cover your mouth and nose when you cough or sneeze.  Wash your hands with soap and water often, especially after you cough or sneeze. If you cannot use soap and water, use hand sanitizer.  Stay home from work or school as told by your doctor. Unless you are visiting your doctor, try to avoid leaving home until your fever has been gone for 24 hours without the use of medicine.  Keep all follow-up visits as told by your doctor. This is important. How  is this prevented?  Getting a yearly (annual) flu shot is the best way to avoid getting the flu. You may get the flu shot in late summer, fall, or winter. Ask your doctor when you should get your flu shot.  Wash your hands often or use hand sanitizer often.  Avoid contact with people who are sick during cold  and flu season.  Eat healthy foods.  Drink plenty of fluids.  Get enough sleep.  Exercise regularly. Contact a doctor if:  You get new symptoms.  You have: ? Chest pain. ? Watery poop (diarrhea). ? A fever.  Your cough gets worse.  You start to have more mucus.  You feel sick to your stomach (nauseous).  You throw up (vomit). Get help right away if:  You start to be short of breath or have trouble breathing.  Your skin or nails turn a bluish color.  You have very bad pain or stiffness in your neck.  You get a sudden headache.  You get sudden pain in your face or ear.  You cannot stop throwing up. This information is not intended to replace advice given to you by your health care provider. Make sure you discuss any questions you have with your health care provider. Document Released: 12/19/2007 Document Revised: 08/17/2015 Document Reviewed: 01/03/2015 Elsevier Interactive Patient Education  2017 [employer].

## 2017-06-06 NOTE — Progress Notes (Signed)
Name: Eric Glover   MRN: [id number]    DOB: [birth year]   Date:06/06/2017       Progress Note  Subjective  Chief Complaint  Chief Complaint  Patient presents with  . Influenza    tested positive at [facility] and was dehydrated    HPI  PT was seen at the [place] 6 days ago and was diagnosed with the flu - given a bag of IV fluids, ibuprofen, and tamiflu (finished this morning).  He is feeling better today than he has all week, still having cough and nasal congestion and feeling weak/run down.  Denies fevers, tachycardia, lightheadedness, headaches, near-syncope, chest pain.  Endorses some shortness of breath with exertion.  History of Sepsis - Occurred in 2017, possibly secondary to a dental visit.  He is returning to the [place] on Monday for lab re-check as they keep a close eye on his.  Has DM and is on insulin - has been compliant with insulin since becoming ill.  This morning he had a BG of 60 - ate bread and milk and came up to 240. Sick day management discussed in detail.  Patient Active Problem List   Diagnosis Date Noted  . Fall 06/29/2015  . Acute liver failure   . Cholelithiasis without cholecystitis   . Elevated LFTs   . Acute renal failure ([place]) 06/13/2015  . Pressure ulcer 06/13/2015  . A-fib ([place]) 05/30/2015  . Bacteremia due to Gram-positive bacteria 05/24/2015  . DDD (degenerative disc disease), lumbar 05/09/2015  . Sciatica associated with disorder of lumbar spine 05/09/2015  . HTN (hypertension) 05/06/2015  . Acute on chronic kidney failure ([place]) 05/06/2015  . Low back pain 05/06/2015  . Diabetes mellitus with neuropathy ([place]) 01/11/2015  . Benign hypertensive kidney disease 05/31/2010  . Chronic kidney disease (CKD), stage III (moderate) ([place]) 05/31/2010  . Gout 05/31/2010  . HLD (hyperlipidemia) 05/31/2010  . Type 2 diabetes mellitus ([place]) 05/31/2010    Social History   Tobacco Use  . Smoking status: Never Smoker  . Smokeless tobacco: Never Used  Substance Use  Topics  . Alcohol use: No    Alcohol/week: 0.0 oz     Current Outpatient Medications:  .  allopurinol (ZYLOPRIM) 100 MG tablet, Take 100 mg by mouth daily. , Disp: , Rfl:  .  amiodarone (PACERONE) 200 MG tablet, Take 1 tablet (200 mg total) by mouth daily., Disp: 30 tablet, Rfl: 2 .  apixaban (ELIQUIS) 5 MG TABS tablet, Take 1 tablet (5 mg total) by mouth 2 (two) times daily., Disp: 60 tablet, Rfl: 2 .  aspirin EC 81 MG tablet, Take 81 mg by mouth at bedtime. , Disp: , Rfl:  .  beclomethasone (QVAR) 80 MCG/ACT inhaler, Inhale 2 puffs into the lungs 2 (two) times daily as needed (for shortness of breath)., Disp: , Rfl:  .  colchicine (COLCRYS) 0.6 MG tablet, Take 0.6 mg by mouth daily as needed (for gout flares). , Disp: , Rfl:  .  docusate sodium (COLACE) 100 MG capsule, Take 1 capsule (100 mg total) by mouth 2 (two) times daily., Disp: 10 capsule, Rfl: 0 .  doxazosin (CARDURA) 8 MG tablet, Take 1 tablet by mouth at bedtime., Disp: , Rfl:  .  fentaNYL (DURAGESIC - DOSED MCG/HR) 50 MCG/HR, Place 1 patch (50 mcg total) onto the skin every 3 (three) days., Disp: 5 patch, Rfl: 0 .  fluticasone (FLONASE) 50 MCG/ACT nasal spray, Place 1-2 sprays into both nostrils daily as needed for rhinitis., Disp: ,  Rfl:  .  insulin aspart (NOVOLOG) 100 UNIT/ML injection, Inject 4-12 Units into the skin 3 (three) times daily with meals as needed for high blood sugar. Pt uses as needed per sliding scale., Disp: , Rfl:  .  insulin detemir (LEVEMIR) 100 UNIT/ML injection, Inject 0.05 mLs (5 Units total) into the skin at bedtime. (Patient taking differently: Inject 12 Units into the skin at bedtime. ), Disp: 10 mL, Rfl: 11 .  ipratropium (ATROVENT) 0.03 % nasal spray, Place 1 spray into both nostrils every 12 (twelve) hours., Disp: , Rfl: 12 .  lactulose (CHRONULAC) 10 GM/15ML solution, Take 30 mLs (20 g total) by mouth daily as needed for mild constipation., Disp: 240 mL, Rfl: 0 .  lansoprazole (PREVACID) 15 MG  capsule, Take 15 mg by mouth daily. , Disp: , Rfl:  .  lidocaine (LIDODERM) 5 %, Place 1 patch onto the skin daily. Remove & Discard patch within 12 hours or as directed by MD, Disp: 30 patch, Rfl: 0 .  metoprolol tartrate (LOPRESSOR) 25 MG tablet, Take 1 tablet (25 mg total) by mouth 2 (two) times daily., Disp: 60 tablet, Rfl: 0 .  nystatin (MYCOSTATIN) 100000 UNIT/ML suspension, Take 5 mLs (500,000 Units total) by mouth 4 (four) times daily., Disp: 60 mL, Rfl: 0 .  oxyCODONE-acetaminophen (PERCOCET) 5-325 MG tablet, Take 1 tablet by mouth every 6 (six) hours as needed for severe pain., Disp: 20 tablet, Rfl: 0 .  polyethylene glycol (MIRALAX / GLYCOLAX) packet, Take 17 g by mouth daily as needed for mild constipation., Disp: , Rfl:  .  propranolol ER (INDERAL LA) 80 MG 24 hr capsule, Take 1 capsule by mouth daily., Disp: , Rfl:  .  vitamin B-12 (CYANOCOBALAMIN) 500 MCG tablet, Take 500 mcg by mouth daily. , Disp: , Rfl:  .  ferrous sulfate 325 (65 FE) MG tablet, Take 1 tablet by mouth daily., Disp: , Rfl:  .  metoCLOPramide (REGLAN) 5 MG tablet, Take 1 tablet (5 mg total) by mouth every 8 (eight) hours as needed for nausea or vomiting., Disp: 15 tablet, Rfl: 0  Allergies  Allergen Reactions  . Ativan [Lorazepam] Other (See Comments)    Confusion, twitching  . Neomycin-Bacitracin Zn-Polymyx Rash    ROS  Ten systems reviewed and is negative except as mentioned in HPI.  Objective  Vitals:   06/06/17 1402  BP: 124/80  Pulse: 73  Resp: 18  Temp: 97.9 F (36.6 C)  TempSrc: Oral  SpO2: 94%  Weight: 243 lb 11.2 oz (110.5 kg)  Height: 6\' 2"  (1.88 m)   Body mass index is 31.29 kg/m.  Nursing Note and Vital Signs reviewed.  Physical Exam  Constitutional: Patient appears well-developed and well-nourished. Obese No distress.  HEENT: head atraumatic, normocephalic, pupils equal and reactive to light, EOM's intact, TM's without erythema or bulging, +LEFT maxillary tenderness, otherwise  negative for sinus tenderness , neck supple without lymphadenopathy, oropharynx mildly erythematous and moist without exudate Cardiovascular: Normal rate, regular rhythm, S1/S2 present.  No murmur or rub heard. No BLE edema. Pulmonary/Chest: Effort normal and breath sounds diminished at the bases which rhonchi and expiratory wheezes to the RLL. No respiratory distress or retractions. Psychiatric: Patient has a normal mood and affect. behavior is normal. Judgment and thought content normal.  No results found for this or any previous visit (from the past 72 hour(s)).  Assessment & Plan  1. Pneumonia of right lower lobe due to infectious organism [facility]) - Advised we will treat for CAP  due to adventitious lung sounds, shortness of breath, and history of sepsis.  - doxycycline (VIBRA-TABS) 100 MG tablet; Take 1 tablet (100 mg total) by mouth 2 (two) times daily for 7 days.  Dispense: 14 tablet; Refill: 0 - guaiFENesin (MUCINEX) 600 MG 12 hr tablet; Take 1 tablet (600 mg total) by mouth 2 (two) times daily as needed for cough or to loosen phlegm.  Dispense: 20 tablet; Refill: 0  2. Influenza - Completed tamiflu.  Discussed supportive measures, staying well hydrated, and getting plenty of rest.  3. Type 2 diabetes mellitus with stage 3 chronic kidney disease, with long-term current use of insulin (HCC) - Discussed sick day management in detail with pt - sick day target is 110-200 for fasting BG's.  Advised he monitor BG every 3-4 hours as he did experience a single episode of hypoglycemia this week.  Try to keep meals regular, call office if outside of target fasting range. - Discussed how DM can put him at increased risk for complex infections.  4. Nasal congestion - fluticasone (FLONASE) 50 MCG/ACT nasal spray; Place 1-2 sprays into both nostrils daily as needed for rhinitis.  Dispense: 16 g; Refill: 1  5. Cough - benzonatate (TESSALON) 100 MG capsule; Take 1 capsule (100 mg total) by mouth 3  (three) times daily as needed for cough.  Dispense: 20 capsule; Refill: 0 - guaiFENesin (MUCINEX) 600 MG 12 hr tablet; Take 1 tablet (600 mg total) by mouth 2 (two) times daily as needed for cough or to loosen phlegm.  Dispense: 20 tablet; Refill: 0  6. Shortness of breath - albuterol (PROVENTIL HFA;VENTOLIN HFA) 108 (90 Base) MCG/ACT inhaler; Inhale 2 puffs into the lungs every 6 (six) hours as needed for wheezing or shortness of breath.  Dispense: 1 Inhaler; Refill: 0  7. History of sepsis - Advised that a history of sepsis can increase risk of repeated sepsis.  - Keep follow up at [facility] in 2 days for labs as planned. Follow up as needed.  -Red flags and when to present for emergency care or RTC including fever >101.80F, chest pain, shortness of breath, new/worsening/un-resolving symptoms, reviewed with patient at time of visit. Follow up and care instructions discussed and provided in AVS.

## 2017-11-03 ENCOUNTER — Ambulatory Visit: Payer: Federal, State, Local not specified - PPO | Admitting: Nurse Practitioner

## 2017-11-03 ENCOUNTER — Ambulatory Visit: Payer: Self-pay | Admitting: Nurse Practitioner

## 2017-11-03 ENCOUNTER — Encounter: Payer: Self-pay | Admitting: Nurse Practitioner

## 2017-11-03 VITALS — BP 134/80 | HR 66 | Temp 98.6°F | Resp 16 | Ht 74.0 in | Wt 251.9 lb

## 2017-11-03 DIAGNOSIS — R05 Cough: Secondary | ICD-10-CM

## 2017-11-03 DIAGNOSIS — J069 Acute upper respiratory infection, unspecified: Secondary | ICD-10-CM | POA: Diagnosis not present

## 2017-11-03 DIAGNOSIS — R059 Cough, unspecified: Secondary | ICD-10-CM

## 2017-11-03 DIAGNOSIS — J029 Acute pharyngitis, unspecified: Secondary | ICD-10-CM

## 2017-11-03 DIAGNOSIS — S93609A Unspecified sprain of unspecified foot, initial encounter: Secondary | ICD-10-CM | POA: Insufficient documentation

## 2017-11-03 MED ORDER — BENZONATATE 100 MG PO CAPS
100.0000 mg | ORAL_CAPSULE | Freq: Three times a day (TID) | ORAL | 0 refills | Status: AC | PRN
Start: 2017-11-03 — End: ?

## 2017-11-03 MED ORDER — MAGIC MOUTHWASH W/LIDOCAINE: 5.0000 mL | Freq: Three times a day (TID) | ORAL | 0 refills | Status: AC

## 2017-11-03 NOTE — Patient Instructions (Signed)
You likely have a viral upper respiratory infection (URI). Antibiotics will not reduce the number of days you are ill or prevent you from getting bacterial rhinosinusitis. A URI can take up to 14 days to resolve, but typically last between 7-11 days. Your body is so smart and strong that it will be fighting this illness off for you but it is important that you drink plenty of fluids, rest. Cover your nose/mouth when you cough or sneeze and wash your hands well and often. Here are some helpful things you can use or pick up over the counter from the pharmacy to help with your symptoms:   For Fever/Pain: Acetaminophen every 6 hours as needed (maximum of 3000mg a day). If you are still uncomfortable you can add ibuprofen OR naproxen  For coughing: try dextromethorphan for a cough suppressant, and/or a cool mist humidifier, lozenges  For sore throat: saline gargles, honey herbal tea, lozenges, throat spray  To dry out your nose: try an antihistamine like loratadine (non-sedating) or diphenhydramine (sedating) or others To relieve a stuffy nose: try an oral decongestant  Like pseudoephedrine if you are under the age of 50 and do not have high blood pressure, neti pot To make blowing your nose easier: guaifenesin    

## 2017-11-03 NOTE — Progress Notes (Addendum)
Name: Eric Glover   MRN: [id number]    DOB: Dec 20, 1945   Date:11/03/2017       Progress Note  Subjective  Chief Complaint  Chief Complaint  Patient presents with  . URI    onset 1 day, symptoms include sore throat, cough, drainage.    HPI  Started yesterday morning with sore throat, then started getting nasal drainage and now has cough. Endorses some maxillary sinus pressure, no pain.   Denies fevers, chills, headaches, body aches, abdominal pain, nausea, vomiting, muffled sounds. Has chronic dry eyes a little worse the last couple of days, no redness, or drainage.   Took 2 dayquil without relief.   Patient Active Problem List   Diagnosis Date Noted  . Sprain of foot 11/03/2017  . Fall 06/29/2015  . Acute liver failure   . Cholelithiasis without cholecystitis   . Elevated LFTs   . Acute renal failure ([place]) 06/13/2015  . Pressure ulcer 06/13/2015  . A-fib ([place]) 05/30/2015  . Bacteremia due to Gram-positive bacteria 05/24/2015  . DDD (degenerative disc disease), lumbar 05/09/2015  . Sciatica associated with disorder of lumbar spine 05/09/2015  . HTN (hypertension) 05/06/2015  . Acute on chronic kidney failure ([place]) 05/06/2015  . Low back pain 05/06/2015  . Diabetes mellitus with neuropathy ([place]) 01/11/2015  . Benign hypertensive kidney disease 05/31/2010  . Chronic kidney disease (CKD), stage III (moderate) ([place]) 05/31/2010  . Gout 05/31/2010  . HLD (hyperlipidemia) 05/31/2010  . Type 2 diabetes mellitus ([place]) 05/31/2010    Past Medical History:  Diagnosis Date  . Atrial fibrillation ([place])   . CKD (chronic kidney disease), stage III ([place])   . Diabetes mellitus without complication ([place])   . Discitis of lumbosacral region   . Discitis of lumbosacral region   . GERD (gastroesophageal reflux disease)   . Gout   . Hyperlipidemia   . Hypertension   . Pulmonary embolism ([place])   . Staph infection 2017    Past Surgical History:  Procedure Laterality Date  .  SHOULDER ARTHROSCOPY W/ ROTATOR CUFF REPAIR    . TEE WITHOUT CARDIOVERSION N/A 05/15/2015   Procedure: TRANSESOPHAGEAL ECHOCARDIOGRAM (TEE);  Surgeon: Teodoro Spray, MD;  Location: [facility] ORS;  Service: Cardiovascular;  Laterality: N/A;    Social History   Tobacco Use  . Smoking status: Never Smoker  . Smokeless tobacco: Never Used  Substance Use Topics  . Alcohol use: No    Alcohol/week: 0.0 standard drinks     Current Outpatient Medications:  .  albuterol (PROVENTIL HFA;VENTOLIN HFA) 108 (90 Base) MCG/ACT inhaler, Inhale 2 puffs into the lungs every 6 (six) hours as needed for wheezing or shortness of breath., Disp: 1 Inhaler, Rfl: 0 .  allopurinol (ZYLOPRIM) 100 MG tablet, Take 100 mg by mouth daily. , Disp: , Rfl:  .  amiodarone (PACERONE) 200 MG tablet, Take 1 tablet (200 mg total) by mouth daily., Disp: 30 tablet, Rfl: 2 .  aspirin EC 81 MG tablet, Take 81 mg by mouth at bedtime. , Disp: , Rfl:  .  atorvastatin (LIPITOR) 40 MG tablet, Take 20 mg by mouth daily., Disp: , Rfl:  .  colchicine (COLCRYS) 0.6 MG tablet, Take 0.6 mg by mouth daily as needed (for gout flares). , Disp: , Rfl:  .  docusate sodium (COLACE) 100 MG capsule, Take 1 capsule (100 mg total) by mouth 2 (two) times daily., Disp: 10 capsule, Rfl: 0 .  doxazosin (CARDURA) 8 MG tablet, Take 1 tablet by mouth at  bedtime., Disp: , Rfl:  .  fluticasone (FLONASE) 50 MCG/ACT nasal spray, Place 1-2 sprays into both nostrils daily as needed for rhinitis., Disp: 16 g, Rfl: 1 .  insulin aspart (NOVOLOG) 100 UNIT/ML injection, Inject 4-12 Units into the skin 3 (three) times daily with meals as needed for high blood sugar. Pt uses as needed per sliding scale., Disp: , Rfl:  .  insulin detemir (LEVEMIR) 100 UNIT/ML injection, Inject 0.05 mLs (5 Units total) into the skin at bedtime. (Patient taking differently: Inject 40 Units into the skin at bedtime. ), Disp: 10 mL, Rfl: 11 .  ipratropium (ATROVENT) 0.03 % nasal spray, Place 1 spray  into both nostrils every 12 (twelve) hours., Disp: , Rfl: 12 .  lactulose (CHRONULAC) 10 GM/15ML solution, Take 30 mLs (20 g total) by mouth daily as needed for mild constipation., Disp: 240 mL, Rfl: 0 .  lansoprazole (PREVACID) 15 MG capsule, Take 15 mg by mouth daily. , Disp: , Rfl:  .  lidocaine (LIDODERM) 5 %, Place 1 patch onto the skin daily. Remove & Discard patch within 12 hours or as directed by MD, Disp: 30 patch, Rfl: 0 .  metFORMIN (GLUCOPHAGE-XR) 500 MG 24 hr tablet, Take 500 mg by mouth daily with breakfast., Disp: , Rfl:  .  metoprolol tartrate (LOPRESSOR) 25 MG tablet, Take 1 tablet (25 mg total) by mouth 2 (two) times daily., Disp: 60 tablet, Rfl: 0 .  oxyCODONE-acetaminophen (PERCOCET) 5-325 MG tablet, Take 1 tablet by mouth every 6 (six) hours as needed for severe pain., Disp: 20 tablet, Rfl: 0 .  polyethylene glycol (MIRALAX / GLYCOLAX) packet, Take 17 g by mouth daily as needed for mild constipation., Disp: , Rfl:  .  propranolol ER (INDERAL LA) 80 MG 24 hr capsule, Take 1 capsule by mouth daily., Disp: , Rfl:  .  senna-docusate (SENOKOT-S) 8.6-50 MG tablet, Take 2 tablets by mouth 2 (two) times daily., Disp: , Rfl:  .  vitamin B-12 (CYANOCOBALAMIN) 500 MCG tablet, Take 500 mcg by mouth daily. , Disp: , Rfl:  .  apixaban (ELIQUIS) 5 MG TABS tablet, Take 1 tablet (5 mg total) by mouth 2 (two) times daily. (Patient not taking: Reported on 11/03/2017), Disp: 60 tablet, Rfl: 2 .  beclomethasone (QVAR) 80 MCG/ACT inhaler, Inhale 2 puffs into the lungs 2 (two) times daily as needed (for shortness of breath)., Disp: , Rfl:  .  benzonatate (TESSALON) 100 MG capsule, Take 1 capsule (100 mg total) by mouth 3 (three) times daily as needed for cough., Disp: 30 capsule, Rfl: 0 .  fentaNYL (DURAGESIC - DOSED MCG/HR) 50 MCG/HR, Place 1 patch (50 mcg total) onto the skin every 3 (three) days. (Patient not taking: Reported on 11/03/2017), Disp: 5 patch, Rfl: 0 .  ferrous sulfate 325 (65 FE) MG  tablet, Take 1 tablet by mouth daily., Disp: , Rfl:  .  guaiFENesin (MUCINEX) 600 MG 12 hr tablet, Take 1 tablet (600 mg total) by mouth 2 (two) times daily as needed for cough or to loosen phlegm. (Patient not taking: Reported on 11/03/2017), Disp: 20 tablet, Rfl: 0 .  magic mouthwash w/lidocaine SOLN, Take 5 mLs by mouth 3 (three) times daily. Can exclude nystain- please use pharmacy protocol for dosage., Disp: 120 mL, Rfl: 0 .  metoCLOPramide (REGLAN) 5 MG tablet, Take 1 tablet (5 mg total) by mouth every 8 (eight) hours as needed for nausea or vomiting., Disp: 15 tablet, Rfl: 0 .  nystatin (MYCOSTATIN) 100000 UNIT/ML suspension, Take  5 mLs (500,000 Units total) by mouth 4 (four) times daily. (Patient not taking: Reported on 11/03/2017), Disp: 60 mL, Rfl: 0  Allergies  Allergen Reactions  . Ativan [Lorazepam] Other (See Comments)    Confusion, twitching  . Neomycin-Bacitracin Zn-Polymyx Rash    ROS   No other specific complaints in a complete review of systems (except as listed in HPI above).  Objective  Vitals:   11/03/17 1410 11/03/17 1443  BP: 134/80   Pulse: 66   Resp:  16  Temp: 98.6 F (37 C)   SpO2: 96%   Weight: 251 lb 14.4 oz (114.3 kg)   Height: 6\' 2"  (1.88 m)     Body mass index is 32.34 kg/m.  Nursing Note and Vital Signs reviewed.  Physical Exam  Constitutional: He is oriented to person, place, and time. He appears well-developed and well-nourished.  HENT:  Right Ear: Hearing, tympanic membrane, external ear and ear canal normal.  Left Ear: Hearing, tympanic membrane, external ear and ear canal normal.  Nose: Rhinorrhea present. No mucosal edema.  Mouth/Throat: Oropharynx is clear and moist and mucous membranes are normal. No oropharyngeal exudate.  Eyes: Pupils are equal, round, and reactive to light. Conjunctivae and EOM are normal. Right eye exhibits no discharge. Left eye exhibits no discharge.  Neck: Normal range of motion. Neck supple.   Cardiovascular: Normal rate, regular rhythm and intact distal pulses.  Pulmonary/Chest: Effort normal and breath sounds normal.  Musculoskeletal: Normal range of motion.  Lymphadenopathy:    He has no cervical adenopathy.  Neurological: He is alert and oriented to person, place, and time.  Skin: Skin is warm and dry. No rash noted. He is not diaphoretic.  Psychiatric: He has a normal mood and affect. His behavior is normal. Judgment and thought content normal.   Diabetic Foot Exam - Simple   Simple Foot Form Diabetic Foot exam was performed with the following findings:  Yes 11/03/2017  4:18 PM  Visual Inspection No deformities, no ulcerations, no other skin breakdown bilaterally:  Yes Sensation Testing Intact to touch and monofilament testing bilaterally:  Yes Pulse Check Posterior Tibialis and Dorsalis pulse intact bilaterally:  Yes Comments      No results found for this or any previous visit (from the past 48 hour(s)).  Assessment & Plan  1. Cough - benzonatate (TESSALON) 100 MG capsule; Take 1 capsule (100 mg total) by mouth 3 (three) times daily as needed for cough.  Dispense: 30 capsule; Refill: 0  2. Sore throat - magic mouthwash w/lidocaine SOLN; Take 5 mLs by mouth 3 (three) times daily. Can exclude nystain- please use pharmacy protocol for dosage.  Dispense: 120 mL; Refill: 0  3. Upper respiratory tract infection, unspecified type - Rest, fluids, proper nutrition. Discussed antibiotic stewardship- and OTC treatments; If significant worsening  or not improving around day 7-10 will call back.   - benzonatate (TESSALON) 100 MG capsule; Take 1 capsule (100 mg total) by mouth 3 (three) times daily as needed for cough.  Dispense: 30 capsule; Refill: 0   -Red flags and when to present for emergency care or RTC including fever >101.68F, chest pain, shortness of breath, new/worsening/un-resolving symptoms,  reviewed with patient at time of visit. Follow up and care instructions  discussed and provided in AVS. -Reviewed Health Maintenance: [facility]- need to request [facility].   -------------------------------- I have reviewed this encounter including the documentation in this note and/or discussed this patient with the provider,  Suezanne Cheshire DNP AGNP-C. I am certifying that I agree with the content of this note as supervising physician. [place], [facility] 11/09/2017, 6:14 PM

## 2017-11-04 ENCOUNTER — Ambulatory Visit (INDEPENDENT_AMBULATORY_CARE_PROVIDER_SITE_OTHER): Payer: Federal, State, Local not specified - PPO | Admitting: Podiatry

## 2017-11-04 ENCOUNTER — Encounter: Payer: Self-pay | Admitting: Podiatry

## 2017-11-04 VITALS — BP 122/63 | HR 64

## 2017-11-04 DIAGNOSIS — L989 Disorder of the skin and subcutaneous tissue, unspecified: Secondary | ICD-10-CM

## 2017-11-06 NOTE — Progress Notes (Signed)
   Subjective: 72 year old male with PMHx of DM presenting today as a new patient with a chief complaint of a painful callus lesion noted to the sub-fifth MPJ of the left foot that appeared 2-3 months ago. Walking and bearing weight for long periods of time increases the pain. He has not done anything for treatment. Patient is here for further evaluation and treatment.   Past Medical History:  Diagnosis Date  . Atrial fibrillation ([place])   . CKD (chronic kidney disease), stage III ([place])   . Diabetes mellitus without complication ([place])   . Discitis of lumbosacral region   . Discitis of lumbosacral region   . GERD (gastroesophageal reflux disease)   . Gout   . Hyperlipidemia   . Hypertension   . Pulmonary embolism ([place])   . Staph infection 2017     Objective:  Physical Exam General: Alert and oriented x3 in no acute distress  Dermatology: Hyperkeratotic lesion present on the left sub-fifth MPJ. Pain on palpation with a central nucleated core noted. Skin is warm, dry and supple bilateral lower extremities. Negative for open lesions or macerations.  Vascular: Palpable pedal pulses bilaterally. No edema or erythema noted. Capillary refill within normal limits.  Neurological: Epicritic and protective threshold grossly intact bilaterally.   Musculoskeletal Exam: Pain on palpation at the keratotic lesion noted. Range of motion within normal limits bilateral. Muscle strength 5/5 in all groups bilateral.  Assessment: 1. Porokeratosis left sub-fifth MPJ   Plan of Care:  1. Patient evaluated 2. Excisional debridement of keratoic lesion using a chisel blade was performed without incident.  3. Salinocaine applied and dressed area with light dressing. 4. Patient is to return to the clinic PRN.   Edrick Kins, DPM [facility]  Dr. Edrick Kins, [address]                                        [place], [place] [address]                Office [phone]  Fax [phone]

## 2017-11-07 ENCOUNTER — Ambulatory Visit: Payer: Self-pay | Admitting: Podiatry

## 2017-11-20 ENCOUNTER — Telehealth: Payer: Self-pay | Admitting: Podiatry

## 2017-11-20 NOTE — Telephone Encounter (Signed)
[place] would like to have notes faxed to [phone] Attn: Phineas Real

## 2018-04-12 ENCOUNTER — Emergency Department
Admission: EM | Admit: 2018-04-12 | Discharge: 2018-04-13 | Payer: No Typology Code available for payment source | Attending: Emergency Medicine | Admitting: Emergency Medicine

## 2018-04-12 ENCOUNTER — Emergency Department: Payer: No Typology Code available for payment source

## 2018-04-12 DIAGNOSIS — N179 Acute kidney failure, unspecified: Secondary | ICD-10-CM | POA: Insufficient documentation

## 2018-04-12 DIAGNOSIS — Z794 Long term (current) use of insulin: Secondary | ICD-10-CM | POA: Diagnosis not present

## 2018-04-12 DIAGNOSIS — E860 Dehydration: Secondary | ICD-10-CM | POA: Insufficient documentation

## 2018-04-12 DIAGNOSIS — N183 Chronic kidney disease, stage 3 (moderate): Secondary | ICD-10-CM | POA: Insufficient documentation

## 2018-04-12 DIAGNOSIS — I129 Hypertensive chronic kidney disease with stage 1 through stage 4 chronic kidney disease, or unspecified chronic kidney disease: Secondary | ICD-10-CM | POA: Diagnosis not present

## 2018-04-12 DIAGNOSIS — C259 Malignant neoplasm of pancreas, unspecified: Secondary | ICD-10-CM | POA: Diagnosis not present

## 2018-04-12 DIAGNOSIS — Z79899 Other long term (current) drug therapy: Secondary | ICD-10-CM | POA: Insufficient documentation

## 2018-04-12 DIAGNOSIS — E1122 Type 2 diabetes mellitus with diabetic chronic kidney disease: Secondary | ICD-10-CM | POA: Diagnosis not present

## 2018-04-12 DIAGNOSIS — Z7982 Long term (current) use of aspirin: Secondary | ICD-10-CM | POA: Insufficient documentation

## 2018-04-12 DIAGNOSIS — K5900 Constipation, unspecified: Secondary | ICD-10-CM | POA: Diagnosis present

## 2018-04-12 LAB — COMPREHENSIVE METABOLIC PANEL
ALT: 11 U/L (ref 0–44)
AST: 22 U/L (ref 15–41)
Albumin: 3.2 g/dL — ABNORMAL LOW (ref 3.5–5.0)
Alkaline Phosphatase: 68 U/L (ref 38–126)
Anion gap: 13 (ref 5–15)
BUN: 54 mg/dL — ABNORMAL HIGH (ref 8–23)
CHLORIDE: 98 mmol/L (ref 98–111)
CO2: 22 mmol/L (ref 22–32)
CREATININE: 2.52 mg/dL — AB (ref 0.61–1.24)
Calcium: 8.7 mg/dL — ABNORMAL LOW (ref 8.9–10.3)
GFR calc Af Amer: 28 mL/min — ABNORMAL LOW (ref >60)
GFR calc non Af Amer: 24 mL/min — ABNORMAL LOW (ref >60)
Glucose, Bld: 257 mg/dL — ABNORMAL HIGH (ref 70–99)
Potassium: 4.1 mmol/L (ref 3.5–5.1)
Sodium: 133 mmol/L — ABNORMAL LOW (ref 135–145)
Total Bilirubin: 0.7 mg/dL (ref 0.3–1.2)
Total Protein: 7.2 g/dL (ref 6.5–8.1)

## 2018-04-12 LAB — CBC
HCT: 41.4 % (ref 39.0–52.0)
Hemoglobin: 12.8 g/dL — ABNORMAL LOW (ref 13.0–17.0)
MCH: 25 pg — ABNORMAL LOW (ref 26.0–34.0)
MCHC: 30.9 g/dL (ref 30.0–36.0)
MCV: 80.9 fL (ref 80.0–100.0)
Platelets: 716 10*3/uL — ABNORMAL HIGH (ref 150–400)
RBC: 5.12 MIL/uL (ref 4.22–5.81)
RDW: 14.6 % (ref 11.5–15.5)
WBC: 12.3 10*3/uL — ABNORMAL HIGH (ref 4.0–10.5)
nRBC: 0 % (ref 0.0–0.2)

## 2018-04-12 LAB — LIPASE, BLOOD: Lipase: 42 U/L (ref 11–51)

## 2018-04-12 MED ORDER — ONDANSETRON HCL 4 MG/2ML IJ SOLN
4.0000 mg | Freq: Once | INTRAMUSCULAR | Status: AC
  Administered 2018-04-13: 4 mg via INTRAVENOUS
  Filled 2018-04-12: qty 2

## 2018-04-12 MED ORDER — SODIUM CHLORIDE 0.9 % IV BOLUS
1000.0000 mL | Freq: Once | INTRAVENOUS | Status: AC
  Administered 2018-04-13: 1000 mL via INTRAVENOUS

## 2018-04-12 MED ORDER — MORPHINE SULFATE (PF) 4 MG/ML IV SOLN
4.0000 mg | Freq: Once | INTRAVENOUS | Status: AC
  Administered 2018-04-13: 4 mg via INTRAVENOUS
  Filled 2018-04-12: qty 1

## 2018-04-12 MED ORDER — SODIUM CHLORIDE 0.9% FLUSH
3.0000 mL | Freq: Once | INTRAVENOUS | Status: AC
  Administered 2018-04-12: 3 mL via INTRAVENOUS

## 2018-04-12 NOTE — ED Triage Notes (Signed)
Patient c/o constipation X 1 week and abdominal pain. Patient has been taking laxatives all week. Patient reports one large loose stool in the waiting room.

## 2018-04-12 NOTE — ED Provider Notes (Signed)
[facility] Emergency Department Provider Note  ____________________________________________   First MD Initiated Contact with Patient 04/12/18 2330     (approximate)  I have reviewed the triage vital signs and the nursing notes.   HISTORY  Chief Complaint Constipation   HPI Eric Glover is a 73 y.o. male who self presents to the emergency department with roughly 1 week of constipation and abdominal pain.  He has taken 3 different laxatives with no relief.  This evening around 10:30 PM he did have a small hard bowel movement.  He has a past medical history of diabetes mellitus as well as chronic kidney disease and hypertension.  He says that over the past several months he has unintentionally lost 30 or 35 pounds.  He also has noted his abdomen has become quite distended recently and this week he has a follow-up at the [place] to see gastroenterology as his primary care physician thinks he might have gastroparesis.  His symptoms have been gradual onset slowly progressive are now moderate severity and nothing seems to make them better or worse.    Past Medical History:  Diagnosis Date  . Atrial fibrillation ([place])   . CKD (chronic kidney disease), stage III ([place])   . Diabetes mellitus without complication ([place])   . Discitis of lumbosacral region   . Discitis of lumbosacral region   . GERD (gastroesophageal reflux disease)   . Gout   . Hyperlipidemia   . Hypertension   . Pulmonary embolism ([place])   . Staph infection 2017    Patient Active Problem List   Diagnosis Date Noted  . Sprain of foot 11/03/2017  . Fall 06/29/2015  . Acute liver failure   . Cholelithiasis without cholecystitis   . Elevated LFTs   . Acute renal failure ([place]) 06/13/2015  . Pressure ulcer 06/13/2015  . A-fib ([place]) 05/30/2015  . Bacteremia due to Gram-positive bacteria 05/24/2015  . DDD (degenerative disc disease), lumbar 05/09/2015  . Sciatica associated  with disorder of lumbar spine 05/09/2015  . HTN (hypertension) 05/06/2015  . Acute on chronic kidney failure ([place]) 05/06/2015  . Low back pain 05/06/2015  . Diabetes mellitus with neuropathy ([place]) 01/11/2015  . Benign hypertensive kidney disease 05/31/2010  . Chronic kidney disease (CKD), stage III (moderate) ([place]) 05/31/2010  . Gout 05/31/2010  . HLD (hyperlipidemia) 05/31/2010  . Type 2 diabetes mellitus ([place]) 05/31/2010    Past Surgical History:  Procedure Laterality Date  . SHOULDER ARTHROSCOPY W/ ROTATOR CUFF REPAIR    . TEE WITHOUT CARDIOVERSION N/A 05/15/2015   Procedure: TRANSESOPHAGEAL ECHOCARDIOGRAM (TEE);  Surgeon: Teodoro Spray, MD;  Location: [facility] ORS;  Service: Cardiovascular;  Laterality: N/A;    Prior to Admission medications   Medication Sig Start Date End Date Taking? Authorizing Provider  albuterol (PROVENTIL HFA;VENTOLIN HFA) 108 (90 Base) MCG/ACT inhaler Inhale 2 puffs into the lungs every 6 (six) hours as needed for wheezing or shortness of breath. 06/06/17   Hubbard Hartshorn, FNP  allopurinol (ZYLOPRIM) 100 MG tablet Take 100 mg by mouth daily.     [provider]  amiodarone (PACERONE) 200 MG tablet Take 1 tablet (200 mg total) by mouth daily. 06/23/15   Steele Sizer, MD  aspirin EC 81 MG tablet Take 81 mg by mouth at bedtime.     [provider]  atorvastatin (LIPITOR) 40 MG tablet Take 20 mg by mouth daily. 01/12/16   [provider]  beclomethasone (QVAR) 80 MCG/ACT inhaler Inhale 2 puffs  into the lungs 2 (two) times daily as needed (for shortness of breath).    [provider]  benzonatate (TESSALON) 100 MG capsule Take 1 capsule (100 mg total) by mouth 3 (three) times daily as needed for cough. 11/03/17   Poulose, Bethel Born, NP  colchicine (COLCRYS) 0.6 MG tablet Take 0.6 mg by mouth daily as needed (for gout flares).     [provider]  docusate sodium (COLACE) 100 MG capsule Take 1 capsule (100 mg total) by  mouth 2 (two) times daily. 07/04/15   Demetrios Loll, MD  doxazosin (CARDURA) 8 MG tablet Take 1 tablet by mouth at bedtime. 03/23/15   [provider]  ferrous sulfate 325 (65 FE) MG tablet Take 1 tablet by mouth daily. 06/29/15 06/28/16  [provider]  fluticasone (FLONASE) 50 MCG/ACT nasal spray Place 1-2 sprays into both nostrils daily as needed for rhinitis. 06/06/17   Hubbard Hartshorn, FNP  insulin aspart (NOVOLOG) 100 UNIT/ML injection Inject 4-12 Units into the skin 3 (three) times daily with meals as needed for high blood sugar. Pt uses as needed per sliding scale.    [provider]  insulin detemir (LEVEMIR) 100 UNIT/ML injection Inject 0.05 mLs (5 Units total) into the skin at bedtime. Patient taking differently: Inject 40 Units into the skin at bedtime.  07/04/15   Demetrios Loll, MD  ipratropium (ATROVENT) 0.03 % nasal spray Place 1 spray into both nostrils every 12 (twelve) hours. 06/22/15   [provider]  lactulose (CHRONULAC) 10 GM/15ML solution Take 30 mLs (20 g total) by mouth daily as needed for mild constipation. 07/04/15   Demetrios Loll, MD  lansoprazole (PREVACID) 15 MG capsule Take 15 mg by mouth daily.     [provider]  lidocaine (LIDODERM) 5 % Place 1 patch onto the skin daily. Remove & Discard patch within 12 hours or as directed by MD 05/17/15   Loletha Grayer, MD  magic mouthwash w/lidocaine SOLN Take 5 mLs by mouth 3 (three) times daily. Can exclude nystain- please use pharmacy protocol for dosage. 11/03/17   Poulose, Bethel Born, NP  metFORMIN (GLUCOPHAGE-XR) 500 MG 24 hr tablet Take 500 mg by mouth daily with breakfast.    [provider]  metoCLOPramide (REGLAN) 5 MG tablet Take 1 tablet (5 mg total) by mouth every 8 (eight) hours as needed for nausea or vomiting. 07/13/15 07/12/16  Orbie Pyo, MD  metoprolol tartrate (LOPRESSOR) 25 MG tablet Take 1 tablet (25 mg total) by mouth 2 (two) times daily. 06/23/15   Steele Sizer, MD  oxyCODONE-acetaminophen (PERCOCET) 5-325 MG tablet Take 1 tablet by mouth every 6 (six) hours as needed for severe pain. 07/04/15   Demetrios Loll, MD  polyethylene glycol [facility]) packet Take 17 g by mouth daily as needed for mild constipation.    [provider]  propranolol ER (INDERAL LA) 80 MG 24 hr capsule Take 1 capsule by mouth daily. 06/19/15   [provider]  senna-docusate (SENOKOT-S) 8.6-50 MG tablet Take 2 tablets by mouth 2 (two) times daily.    [provider]  vitamin B-12 (CYANOCOBALAMIN) 500 MCG tablet Take 500 mcg by mouth daily.     [provider]    Allergies Ativan [lorazepam] and Neomycin-bacitracin zn-polymyx  Family History  Problem Relation Age of Onset  . Arthritis Mother   . Cancer Mother   . Diabetes Mother   . Hearing loss Mother   . Heart disease Mother   .  Hyperlipidemia Mother   . Hypertension Mother   . Kidney disease Mother   . Diabetes Father   . Hearing loss Father   . Congestive Heart Failure Father   . Alcohol abuse Paternal Uncle   . Early death Paternal Uncle     Social History Social History   Tobacco Use  . Smoking status: Never Smoker  . Smokeless tobacco: Never Used  Substance Use Topics  . Alcohol use: No    Alcohol/week: 0.0 standard drinks  . Drug use: No    Review of Systems Constitutional: No fever/chills Eyes: No visual changes. ENT: No sore throat. Cardiovascular: Denies chest pain. Respiratory: Denies shortness of breath. Gastrointestinal: Positive for abdominal pain.  Positive for nausea, no vomiting.  No diarrhea.  Positive for constipation. Genitourinary: Negative for dysuria. Musculoskeletal: Negative for back pain. Skin: Negative for rash. Neurological: Negative for headaches, focal weakness or numbness.   ____________________________________________   PHYSICAL EXAM:  VITAL SIGNS: ED Triage Vitals  Enc Vitals Group     BP 04/12/18 2156 125/78       Pulse Rate 04/12/18 2156 96     Resp 04/12/18 2156 18     Temp 04/12/18 2156 97.7 F (36.5 C)     Temp Source 04/12/18 2156 Oral     SpO2 04/12/18 2156 98 %     Weight 04/12/18 2158 233 lb (105.7 kg)     Height 04/12/18 2158 6\' 2"  (1.88 m)     Head Circumference --      Peak Flow --      Pain Score 04/12/18 2157 7     Pain Loc --      Pain Edu? --      Excl. in [place]? --     Constitutional: Alert and oriented x4 somewhat cachectic although nontoxic no diaphoresis speaks in full clear sentences Eyes: PERRL EOMI. Head: Atraumatic. Nose: No congestion/rhinnorhea. Mouth/Throat: No trismus Neck: No stridor.   Cardiovascular: Normal rate, regular rhythm. Grossly normal heart sounds.  Good peripheral circulation. Respiratory: Normal respiratory effort.  No retractions. Lungs CTAB and moving good air Gastrointestinal: Distended abdomen with fluid wave.  Diffusely mild tender with no focality no rebound or guarding no peritonitis Musculoskeletal: No lower extremity edema   Neurologic:  Normal speech and language. No gross focal neurologic deficits are appreciated. Skin:  Skin is warm, dry and intact. No rash noted. Psychiatric: Mood and affect are normal. Speech and behavior are normal.    ____________________________________________   DIFFERENTIAL includes but not limited to  Bowel obstruction, appendicitis, diverticulitis, urinary tract infection, malignancy ____________________________________________   LABS (all labs ordered are listed, but only abnormal results are displayed)  Labs Reviewed  COMPREHENSIVE METABOLIC PANEL - Abnormal; Notable for the following components:      Result Value   Sodium 133 (*)    Glucose, Bld 257 (*)    BUN 54 (*)    Creatinine, Ser 2.52 (*)    Calcium 8.7 (*)    Albumin 3.2 (*)    GFR calc non Af Amer 24 (*)    GFR calc Af Amer 28 (*)    All other components within normal limits  CBC - Abnormal; Notable for the following components:    WBC 12.3 (*)    Hemoglobin 12.8 (*)    MCH 25.0 (*)    Platelets 716 (*)    All other components within normal limits  LIPASE, BLOOD  URINALYSIS, COMPLETE (UACMP) WITH MICROSCOPIC    Lab work  reviewed by me with a number of abnormalities.  He has elevated creatinine consistent with acute kidney injury.  His albumin is low consistent with poor nutrition versus cirrhosis.  He has elevated blood sugar but no signs of DKA. __________________________________________  EKG  ED ECG REPORT I, Darel Hong, the attending physician, personally viewed and interpreted this ECG.  Date: 03/26/2018 EKG Time:  Rate: 96 Rhythm: normal sinus rhythm QRS Axis: normal Intervals: normal ST/T Wave abnormalities: normal Narrative Interpretation: no evidence of acute ischemia  ____________________________________________  RADIOLOGY  CT abdomen pelvis reviewed by me concerning for pancreatic cancer ____________________________________________   PROCEDURES  Procedure(s) performed: no  Procedures  Critical Care performed: no  ____________________________________________   INITIAL IMPRESSION / ASSESSMENT AND PLAN / ED COURSE  Pertinent labs & imaging results that were available during my care of the patient were reviewed by me and considered in my medical decision making (see chart for details).   As part of my medical decision making, I reviewed the following data within the [place] History obtained from family if available, nursing notes, old chart and ekg, as well as notes from prior ED visits.  The patient came to the emergency department uncomfortable appearing with abdominal distention and constipation.  Given his advanced age I will check labs to search for electrolyte abnormalities and CT of his abdomen pelvis.  Given for milligrams of IV Zofran and 6 mg of IV morphine for pain control with good relief.  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  I divulge the diagnosis to the patient and his wife were understandably quite upset.  All of his care is at the [facility] so I will reach out to them now regarding transfer.    ----------------------------------------- 1:37 AM on 04/13/2018 -----------------------------------------  I spoke with Dr. Clarnce Flock at the [facility] who has graciously agreed to accept the patient as a transfer.  ____________________________________________   FINAL CLINICAL IMPRESSION(S) / ED DIAGNOSES  Final diagnoses:  Dehydration  Acute kidney injury ([place])  Malignant neoplasm of pancreas, unspecified location of malignancy ([place])      NEW MEDICATIONS STARTED DURING THIS VISIT:  New Prescriptions   No medications on file     Note:  This document was prepared using Dragon voice recognition software and may include unintentional dictation errors.    Darel Hong, MD 04/04/2018 2348

## 2018-04-12 NOTE — ED Notes (Signed)
PT states constipation x 1 week. Says he took laxative 3 different times this week and states that he had not had a bowel movement until about 10:30pm tonight. PT abdominal area is distended.

## 2018-04-13 LAB — GLUCOSE, CAPILLARY: Glucose-Capillary: 234 mg/dL — ABNORMAL HIGH (ref 70–99)

## 2018-04-13 MED ORDER — ONDANSETRON HCL 4 MG/2ML IJ SOLN
4.0000 mg | Freq: Once | INTRAMUSCULAR | Status: AC
  Administered 2018-04-13: 4 mg via INTRAVENOUS
  Filled 2018-04-13: qty 2

## 2018-04-13 MED ORDER — MORPHINE SULFATE (PF) 4 MG/ML IV SOLN
6.0000 mg | Freq: Once | INTRAVENOUS | Status: AC
  Administered 2018-04-13: 6 mg via INTRAVENOUS

## 2018-04-13 MED ORDER — MORPHINE SULFATE (PF) 2 MG/ML IV SOLN
INTRAVENOUS | Status: AC
  Filled 2018-04-13: qty 3

## 2018-04-25 DEATH — deceased
